# Patient Record
Sex: Female | Born: 1940 | Race: White | Hispanic: No | Marital: Married | Smoking: Never smoker
Health system: Southern US, Community
[De-identification: ages and names within clinical notes are randomized; demographics above are authoritative.]

## PROBLEM LIST (undated history)

## (undated) DIAGNOSIS — E781 Pure hyperglyceridemia: Secondary | ICD-10-CM

## (undated) DIAGNOSIS — T7840XA Allergy, unspecified, initial encounter: Secondary | ICD-10-CM

## (undated) DIAGNOSIS — C4490 Unspecified malignant neoplasm of skin, unspecified: Secondary | ICD-10-CM

## (undated) DIAGNOSIS — S83209A Unspecified tear of unspecified meniscus, current injury, unspecified knee, initial encounter: Secondary | ICD-10-CM

## (undated) DIAGNOSIS — R251 Tremor, unspecified: Secondary | ICD-10-CM

## (undated) DIAGNOSIS — I639 Cerebral infarction, unspecified: Secondary | ICD-10-CM

## (undated) DIAGNOSIS — E7800 Pure hypercholesterolemia, unspecified: Secondary | ICD-10-CM

## (undated) DIAGNOSIS — H269 Unspecified cataract: Secondary | ICD-10-CM

## (undated) DIAGNOSIS — C50919 Malignant neoplasm of unspecified site of unspecified female breast: Secondary | ICD-10-CM

## (undated) DIAGNOSIS — K5792 Diverticulitis of intestine, part unspecified, without perforation or abscess without bleeding: Secondary | ICD-10-CM

## (undated) DIAGNOSIS — E739 Lactose intolerance, unspecified: Secondary | ICD-10-CM

## (undated) DIAGNOSIS — M1990 Unspecified osteoarthritis, unspecified site: Secondary | ICD-10-CM

## (undated) HISTORY — PX: PAROTID GLAND TUMOR EXCISION: SHX5221

## (undated) HISTORY — DX: Unspecified malignant neoplasm of skin, unspecified: C44.90

## (undated) HISTORY — PX: OTHER SURGICAL HISTORY: SHX169

## (undated) HISTORY — PX: BRAIN SURGERY: SHX531

## (undated) HISTORY — DX: Malignant neoplasm of unspecified site of unspecified female breast: C50.919

## (undated) HISTORY — DX: Cerebral infarction, unspecified: I63.9

## (undated) HISTORY — PX: EYE SURGERY: SHX253

## (undated) HISTORY — DX: Allergy, unspecified, initial encounter: T78.40XA

## (undated) HISTORY — DX: Unspecified cataract: H26.9

## (undated) HISTORY — DX: Unspecified tear of unspecified meniscus, current injury, unspecified knee, initial encounter: S83.209A

## (undated) HISTORY — PX: TONSILLECTOMY: SHX5217

## (undated) HISTORY — DX: Lactose intolerance, unspecified: E73.9

## (undated) HISTORY — PX: WISDOM TOOTH EXTRACTION: SHX21

---

## 1988-05-25 HISTORY — PX: DILATION AND CURETTAGE OF UTERUS: SHX78

## 1988-05-25 HISTORY — PX: BREAST EXCISIONAL BIOPSY: SUR124

## 1998-05-07 ENCOUNTER — Other Ambulatory Visit: Admission: RE | Admit: 1998-05-07 | Discharge: 1998-05-07 | Payer: Self-pay | Admitting: *Deleted

## 1998-08-23 ENCOUNTER — Other Ambulatory Visit: Admission: RE | Admit: 1998-08-23 | Discharge: 1998-08-23 | Payer: Self-pay | Admitting: *Deleted

## 1999-03-21 ENCOUNTER — Encounter (INDEPENDENT_AMBULATORY_CARE_PROVIDER_SITE_OTHER): Payer: Self-pay | Admitting: Specialist

## 1999-03-21 ENCOUNTER — Other Ambulatory Visit: Admission: RE | Admit: 1999-03-21 | Discharge: 1999-03-21 | Payer: Self-pay | Admitting: *Deleted

## 1999-09-04 ENCOUNTER — Other Ambulatory Visit: Admission: RE | Admit: 1999-09-04 | Discharge: 1999-09-04 | Payer: Self-pay | Admitting: *Deleted

## 1999-09-15 ENCOUNTER — Encounter: Admission: RE | Admit: 1999-09-15 | Discharge: 1999-09-15 | Payer: Self-pay | Admitting: *Deleted

## 1999-09-15 ENCOUNTER — Encounter: Payer: Self-pay | Admitting: *Deleted

## 2000-09-15 ENCOUNTER — Encounter: Admission: RE | Admit: 2000-09-15 | Discharge: 2000-09-15 | Payer: Self-pay | Admitting: *Deleted

## 2000-09-15 ENCOUNTER — Encounter: Payer: Self-pay | Admitting: *Deleted

## 2000-09-20 ENCOUNTER — Other Ambulatory Visit: Admission: RE | Admit: 2000-09-20 | Discharge: 2000-09-20 | Payer: Self-pay | Admitting: *Deleted

## 2000-09-23 ENCOUNTER — Encounter: Payer: Self-pay | Admitting: *Deleted

## 2000-09-23 ENCOUNTER — Encounter: Admission: RE | Admit: 2000-09-23 | Discharge: 2000-09-23 | Payer: Self-pay | Admitting: *Deleted

## 2001-09-08 ENCOUNTER — Other Ambulatory Visit: Admission: RE | Admit: 2001-09-08 | Discharge: 2001-09-08 | Payer: Self-pay | Admitting: *Deleted

## 2001-10-13 ENCOUNTER — Encounter: Admission: RE | Admit: 2001-10-13 | Discharge: 2001-10-13 | Payer: Self-pay | Admitting: *Deleted

## 2001-10-13 ENCOUNTER — Encounter: Payer: Self-pay | Admitting: *Deleted

## 2002-08-30 ENCOUNTER — Other Ambulatory Visit: Admission: RE | Admit: 2002-08-30 | Discharge: 2002-08-30 | Payer: Self-pay | Admitting: *Deleted

## 2002-11-02 ENCOUNTER — Encounter: Admission: RE | Admit: 2002-11-02 | Discharge: 2002-11-02 | Payer: Self-pay | Admitting: *Deleted

## 2002-11-02 ENCOUNTER — Encounter: Payer: Self-pay | Admitting: *Deleted

## 2003-10-17 ENCOUNTER — Other Ambulatory Visit: Admission: RE | Admit: 2003-10-17 | Discharge: 2003-10-17 | Payer: Self-pay | Admitting: *Deleted

## 2003-11-05 ENCOUNTER — Encounter: Admission: RE | Admit: 2003-11-05 | Discharge: 2003-11-05 | Payer: Self-pay | Admitting: *Deleted

## 2004-10-28 ENCOUNTER — Other Ambulatory Visit: Admission: RE | Admit: 2004-10-28 | Discharge: 2004-10-28 | Payer: Self-pay | Admitting: *Deleted

## 2004-11-12 ENCOUNTER — Encounter: Admission: RE | Admit: 2004-11-12 | Discharge: 2004-11-12 | Payer: Self-pay | Admitting: *Deleted

## 2005-11-17 ENCOUNTER — Other Ambulatory Visit: Admission: RE | Admit: 2005-11-17 | Discharge: 2005-11-17 | Payer: Self-pay | Admitting: *Deleted

## 2005-11-17 ENCOUNTER — Encounter: Admission: RE | Admit: 2005-11-17 | Discharge: 2005-11-17 | Payer: Self-pay | Admitting: *Deleted

## 2006-11-22 ENCOUNTER — Encounter: Admission: RE | Admit: 2006-11-22 | Discharge: 2006-11-22 | Payer: Self-pay | Admitting: *Deleted

## 2006-12-21 ENCOUNTER — Other Ambulatory Visit: Admission: RE | Admit: 2006-12-21 | Discharge: 2006-12-21 | Payer: Self-pay | Admitting: *Deleted

## 2007-12-19 ENCOUNTER — Encounter: Admission: RE | Admit: 2007-12-19 | Discharge: 2007-12-19 | Payer: Self-pay | Admitting: Gynecology

## 2008-01-03 ENCOUNTER — Other Ambulatory Visit: Admission: RE | Admit: 2008-01-03 | Discharge: 2008-01-03 | Payer: Self-pay | Admitting: Gynecology

## 2009-01-08 ENCOUNTER — Encounter: Admission: RE | Admit: 2009-01-08 | Discharge: 2009-01-08 | Payer: Self-pay | Admitting: Internal Medicine

## 2010-06-16 ENCOUNTER — Encounter: Payer: Self-pay | Admitting: Gynecology

## 2014-02-28 ENCOUNTER — Other Ambulatory Visit: Payer: Self-pay

## 2014-02-28 DIAGNOSIS — Z1231 Encounter for screening mammogram for malignant neoplasm of breast: Secondary | ICD-10-CM

## 2014-03-14 ENCOUNTER — Ambulatory Visit: Payer: Self-pay

## 2014-03-23 ENCOUNTER — Ambulatory Visit
Admission: RE | Admit: 2014-03-23 | Discharge: 2014-03-23 | Disposition: A | Discharge status: Home / Self Care | Payer: Medicare PPO | Source: Ambulatory Visit

## 2014-03-23 DIAGNOSIS — Z1231 Encounter for screening mammogram for malignant neoplasm of breast: Secondary | ICD-10-CM

## 2014-07-31 DIAGNOSIS — E8881 Metabolic syndrome: Secondary | ICD-10-CM | POA: Diagnosis not present

## 2014-07-31 DIAGNOSIS — E663 Overweight: Secondary | ICD-10-CM | POA: Diagnosis not present

## 2014-07-31 DIAGNOSIS — E538 Deficiency of other specified B group vitamins: Secondary | ICD-10-CM | POA: Diagnosis not present

## 2014-10-23 DIAGNOSIS — E8881 Metabolic syndrome: Secondary | ICD-10-CM | POA: Diagnosis not present

## 2014-10-23 DIAGNOSIS — E538 Deficiency of other specified B group vitamins: Secondary | ICD-10-CM | POA: Diagnosis not present

## 2014-10-23 DIAGNOSIS — E663 Overweight: Secondary | ICD-10-CM | POA: Diagnosis not present

## 2014-10-23 DIAGNOSIS — N951 Menopausal and female climacteric states: Secondary | ICD-10-CM | POA: Diagnosis not present

## 2014-11-09 ENCOUNTER — Ambulatory Visit
Admission: RE | Admit: 2014-11-09 | Discharge: 2014-11-09 | Disposition: A | Discharge status: Home / Self Care | Payer: Medicare PPO | Source: Ambulatory Visit | Attending: Gastroenterology | Admitting: Gastroenterology

## 2014-11-09 ENCOUNTER — Encounter (HOSPITAL_BASED_OUTPATIENT_CLINIC_OR_DEPARTMENT_OTHER): Payer: Self-pay | Admitting: *Deleted

## 2014-11-09 ENCOUNTER — Inpatient Hospital Stay (HOSPITAL_BASED_OUTPATIENT_CLINIC_OR_DEPARTMENT_OTHER)
Admission: EM | Admit: 2014-11-09 | Discharge: 2014-11-11 | DRG: 392 | Disposition: A | Discharge status: Home / Self Care | Payer: Medicare PPO | Attending: Family Medicine | Admitting: Family Medicine

## 2014-11-09 ENCOUNTER — Other Ambulatory Visit: Payer: Self-pay | Admitting: Gastroenterology

## 2014-11-09 DIAGNOSIS — K5792 Diverticulitis of intestine, part unspecified, without perforation or abscess without bleeding: Secondary | ICD-10-CM

## 2014-11-09 DIAGNOSIS — E781 Pure hyperglyceridemia: Secondary | ICD-10-CM | POA: Diagnosis present

## 2014-11-09 DIAGNOSIS — Z8719 Personal history of other diseases of the digestive system: Secondary | ICD-10-CM | POA: Diagnosis present

## 2014-11-09 DIAGNOSIS — R112 Nausea with vomiting, unspecified: Secondary | ICD-10-CM | POA: Diagnosis present

## 2014-11-09 DIAGNOSIS — K828 Other specified diseases of gallbladder: Secondary | ICD-10-CM | POA: Diagnosis not present

## 2014-11-09 DIAGNOSIS — K5732 Diverticulitis of large intestine without perforation or abscess without bleeding: Secondary | ICD-10-CM

## 2014-11-09 DIAGNOSIS — A0472 Enterocolitis due to Clostridium difficile, not specified as recurrent: Secondary | ICD-10-CM

## 2014-11-09 DIAGNOSIS — K5730 Diverticulosis of large intestine without perforation or abscess without bleeding: Secondary | ICD-10-CM | POA: Diagnosis not present

## 2014-11-09 DIAGNOSIS — R109 Unspecified abdominal pain: Secondary | ICD-10-CM | POA: Diagnosis not present

## 2014-11-09 DIAGNOSIS — E780 Pure hypercholesterolemia: Secondary | ICD-10-CM | POA: Diagnosis present

## 2014-11-09 DIAGNOSIS — A047 Enterocolitis due to Clostridium difficile: Secondary | ICD-10-CM | POA: Diagnosis present

## 2014-11-09 HISTORY — DX: Tremor, unspecified: R25.1

## 2014-11-09 HISTORY — DX: Pure hypercholesterolemia, unspecified: E78.00

## 2014-11-09 HISTORY — DX: Pure hyperglyceridemia: E78.1

## 2014-11-09 HISTORY — DX: Diverticulitis of intestine, part unspecified, without perforation or abscess without bleeding: K57.92

## 2014-11-09 LAB — CBC WITH DIFFERENTIAL/PLATELET
BASOS ABS: 0 10*3/uL (ref 0.0–0.1)
Basophils Relative: 1 % (ref 0–1)
EOS PCT: 4 % (ref 0–5)
Eosinophils Absolute: 0.2 10*3/uL (ref 0.0–0.7)
HEMATOCRIT: 42.8 % (ref 36.0–46.0)
HEMOGLOBIN: 14.7 g/dL (ref 12.0–15.0)
LYMPHS ABS: 1.1 10*3/uL (ref 0.7–4.0)
Lymphocytes Relative: 23 % (ref 12–46)
MCH: 29.9 pg (ref 26.0–34.0)
MCHC: 34.3 g/dL (ref 30.0–36.0)
MCV: 87 fL (ref 78.0–100.0)
MONO ABS: 0.4 10*3/uL (ref 0.1–1.0)
MONOS PCT: 9 % (ref 3–12)
NEUTROS ABS: 3.1 10*3/uL (ref 1.7–7.7)
Neutrophils Relative %: 63 % (ref 43–77)
Platelets: 190 10*3/uL (ref 150–400)
RBC: 4.92 MIL/uL (ref 3.87–5.11)
RDW: 12.4 % (ref 11.5–15.5)
WBC: 4.9 10*3/uL (ref 4.0–10.5)

## 2014-11-09 LAB — COMPREHENSIVE METABOLIC PANEL
ALK PHOS: 50 U/L (ref 38–126)
ALT: 17 U/L (ref 14–54)
AST: 28 U/L (ref 15–41)
Albumin: 4.4 g/dL (ref 3.5–5.0)
Anion gap: 11 (ref 5–15)
BILIRUBIN TOTAL: 0.9 mg/dL (ref 0.3–1.2)
BUN: 6 mg/dL (ref 6–20)
CHLORIDE: 102 mmol/L (ref 101–111)
CO2: 25 mmol/L (ref 22–32)
Calcium: 9.8 mg/dL (ref 8.9–10.3)
Creatinine, Ser: 0.81 mg/dL (ref 0.44–1.00)
GLUCOSE: 91 mg/dL (ref 65–99)
Potassium: 4.1 mmol/L (ref 3.5–5.1)
SODIUM: 138 mmol/L (ref 135–145)
Total Protein: 7.2 g/dL (ref 6.5–8.1)

## 2014-11-09 MED ORDER — IOPAMIDOL (ISOVUE-300) INJECTION 61%
100.0000 mL | Freq: Once | INTRAVENOUS | Status: AC | PRN
  Administered 2014-11-09: 100 mL via INTRAVENOUS

## 2014-11-09 MED ORDER — SODIUM CHLORIDE 0.9 % IV SOLN
1000.0000 mL | INTRAVENOUS | Status: DC
  Administered 2014-11-10 – 2014-11-11 (×4): 1000 mL via INTRAVENOUS

## 2014-11-09 MED ORDER — SODIUM CHLORIDE 0.9 % IV SOLN
1000.0000 mL | Freq: Once | INTRAVENOUS | Status: AC
  Administered 2014-11-09: 1000 mL via INTRAVENOUS

## 2014-11-09 MED ORDER — ONDANSETRON HCL 4 MG/2ML IJ SOLN
4.0000 mg | Freq: Once | INTRAMUSCULAR | Status: AC
  Administered 2014-11-09: 4 mg via INTRAVENOUS
  Filled 2014-11-09: qty 2

## 2014-11-09 MED ORDER — METRONIDAZOLE 500 MG PO TABS
500.0000 mg | ORAL_TABLET | Freq: Four times a day (QID) | ORAL | Status: DC
  Administered 2014-11-09 – 2014-11-11 (×6): 500 mg via ORAL
  Filled 2014-11-09 (×10): qty 1

## 2014-11-09 MED ORDER — CIPROFLOXACIN IN D5W 400 MG/200ML IV SOLN
400.0000 mg | Freq: Two times a day (BID) | INTRAVENOUS | Status: DC
  Administered 2014-11-09 – 2014-11-11 (×4): 400 mg via INTRAVENOUS
  Filled 2014-11-09 (×5): qty 200

## 2014-11-09 NOTE — ED Notes (Signed)
Abdominal pain. Diarrhea 3 days. She had a CT scan that showed diverticulitis.

## 2014-11-09 NOTE — ED Provider Notes (Signed)
CSN: [id number]     Arrival date & time 11/09/14  1949 History  This chart was scribed for Dorie Rank, MD by Delphia Grates, ED Scribe. This patient was seen in room MH06/MH06 and the patient's care was started at 8:25 PM.   Chief Complaint  Patient presents with  . Abdominal Pain    The history is provided by the patient. No language interpreter was used.     HPI Comments: NAIDELYN PARRELLA is a 74 y.o. female who presents to the Emergency Department complaining of persistent diarrhea for the past 3 days that occurs after eating. Patient reports history of diverticulitis for the past several years, and reports an exacerbation approximately 5 days ago. She reports taking Augmentin and placing herself on a liquid diet. Patient then tried to transition to solid food and notes onset of diarrhea when doing so. Patient contacted the office of her gastroenterologist, Dr. Earlean Shawl, who informed her to a have a CT scan. She received a phone call later today from [place] her of positive findings for acute diverticulitis. She was then advised to come to the ED because the radiologist believes her current ABX treatment (Augmentin) may be ineffective. She reports taking this medication for the past 5 days. She denies any abdominal pain at this time.    Past Medical History  Diagnosis Date  . Diverticulitis   . High cholesterol   . High triglycerides   . Tremor    Past Surgical History  Procedure Laterality Date  . Wisdom tooth extraction     No family history on file. History  Substance Use Topics  . Smoking status: Never Smoker   . Smokeless tobacco: Not on file  . Alcohol Use: No   OB History    No data available     Review of Systems  Gastrointestinal: Positive for diarrhea. Negative for abdominal pain.  All other systems reviewed and are negative.     Allergies  Review of patient's allergies indicates no known allergies.  Home Medications   Prior to Admission  medications   Medication Sig Start Date End Date Taking? Authorizing Provider  Amoxicillin-Pot Clavulanate (AUGMENTIN PO) Take by mouth.   Yes Historical Provider, MD  atorvastatin (LIPITOR) 10 MG tablet Take 10 mg by mouth daily.   Yes Historical Provider, MD  Fenofibrate (TRICOR PO) Take by mouth.   Yes Historical Provider, MD  Ondansetron HCl (ZOFRAN PO) Take by mouth.   Yes Historical Provider, MD  propranolol (INDERAL) 10 MG tablet Take 10 mg by mouth 3 (three) times daily.   Yes Historical Provider, MD   Triage Vitals: BP 119/69 mmHg  Pulse 67  Temp(Src) 98 F (36.7 C) (Oral)  Resp 20  Ht 5\' 3"  (1.6 m)  Wt 138 lb (62.596 kg)  BMI 24.45 kg/m2  SpO2 99%  Physical Exam  Constitutional: She appears well-developed and well-nourished. No distress.  HENT:  Head: Normocephalic and atraumatic.  Right Ear: External ear normal.  Left Ear: External ear normal.  Eyes: Conjunctivae are normal. Right eye exhibits no discharge. Left eye exhibits no discharge. No scleral icterus.  Neck: Neck supple. No tracheal deviation present.  Cardiovascular: Normal rate, regular rhythm and intact distal pulses.   Pulmonary/Chest: Effort normal and breath sounds normal. No stridor. No respiratory distress. She has no wheezes. She has no rales.  Abdominal: Soft. Bowel sounds are normal. She exhibits no distension. There is tenderness in the suprapubic area. There is no rebound and no guarding.  Musculoskeletal: She exhibits no edema or tenderness.  Neurological: She is alert. She has normal strength. No cranial nerve deficit (no facial droop, extraocular movements intact, no slurred speech) or sensory deficit. She exhibits normal muscle tone. She displays no seizure activity. Coordination normal.  Skin: Skin is warm and dry. No rash noted.  Psychiatric: She has a normal mood and affect.  Nursing note and vitals reviewed.   ED Course  Procedures (including critical care time)  DIAGNOSTIC  STUDIES: Oxygen Saturation is 99% on room air, normal by my interpretation.    COORDINATION OF CARE: At 2035 Discussed treatment plan with patient. Patient agrees.   Labs Review Labs Reviewed  CBC WITH DIFFERENTIAL/PLATELET  COMPREHENSIVE METABOLIC PANEL    Imaging Review Ct Abdomen Pelvis W Contrast  11/09/2014   CLINICAL DATA:  74 year old female with clinical signs and symptoms suspicious for diverticulitis.  EXAM: CT ABDOMEN AND PELVIS WITH CONTRAST  TECHNIQUE: Multidetector CT imaging of the abdomen and pelvis was performed using the standard protocol following bolus administration of intravenous contrast.  CONTRAST:  161mL ISOVUE-300 IOPAMIDOL (ISOVUE-300) INJECTION 61%  COMPARISON:  CT of the abdomen and pelvis 11/11/2011.  FINDINGS: BUN and creatinine were obtained on site at [facility] at  [address]  Results:  BUN 5.0 mg/dL,  Creatinine 0.8 mg/dL.  Lower chest:  Unremarkable.  Hepatobiliary: No cystic or solid hepatic lesions. No intra or extrahepatic biliary ductal dilatation. Gallbladder is remarkable for some dependent amorphous intermediate to high attenuation material, most compatible with biliary sludge. Gallbladder is not distended, and there is no pericholecystic fluid or stranding to suggest an acute cholecystitis at this time.  Pancreas: No pancreatic mass. No pancreatic ductal dilatation. No pancreatic or peripancreatic fluid or inflammatory changes.  Spleen: Unremarkable.  Adrenals/Urinary Tract: Sub cm low-attenuation lesion in the posterior aspect of the lower pole of the right kidney is too small to definitively characterize, but is favored to represent tiny cysts. The appearance of the left kidney and bilateral adrenal glands is normal. No hydroureteronephrosis or perinephric stranding. Urinary bladder is normal in appearance.  Stomach/Bowel: The appearance of the stomach is normal. No pathologic dilatation of small bowel or colon. Numerous colonic  diverticulae are noted, particularly in the sigmoid colon. In the region of the proximal sigmoid colon there are some subtle adjacent inflammatory changes, concerning for acute diverticulitis. No discrete diverticular abscess or signs of frank perforation are noted at this time. Normal appendix.  Vascular/Lymphatic: Atherosclerosis throughout the abdominal and pelvic vasculature, without evidence of aneurysm or dissection. No lymphadenopathy noted in the abdomen or pelvis.  Reproductive: Retroverted uterus. Ovaries are unremarkable in appearance.  Other: No significant volume of ascites.  No pneumoperitoneum.  Musculoskeletal: There are no aggressive appearing lytic or blastic lesions noted in the visualized portions of the skeleton.  IMPRESSION: 1. Findings, as above, compatible with acute diverticulitis of the proximal sigmoid colon. No definite diverticular abscess or signs of frank perforation are noted at this time. 2. Biliary sludge in the gallbladder. No findings to suggest an acute cholecystitis at this time. 3. Additional incidental findings, as above. These results will be called to the ordering clinician or representative by the Radiologist Assistant, and communication documented in the PACS or zVision Dashboard.   Electronically Signed   By: Vinnie Langton M.D.   On: 11/09/2014 16:18    Medications  0.9 %  sodium chloride infusion (1,000 mLs Intravenous New Bag/Given 11/09/14 2048)    Followed by  0.9 %  sodium chloride infusion (not administered)  ciprofloxacin (CIPRO) IVPB 400 mg (400 mg Intravenous New Bag/Given 11/09/14 2053)  metroNIDAZOLE (FLAGYL) tablet 500 mg (500 mg Oral Given 11/09/14 2054)  ondansetron (ZOFRAN) injection 4 mg (4 mg Intravenous Given 11/09/14 2053)     MDM   Final diagnoses:  Diverticulitis of large intestine without perforation or abscess without bleeding   Pt has been on oral abx for 5 days.  Pain is improving but diarrhea persists.  CT scan shows persistent  diverticulitis.  Will consult with medical service for transfer and admission considering failure of oral abx regimen.    I personally performed the services described in this documentation, which was scribed in my presence.  The recorded information has been reviewed and is accurate.   Dorie Rank, MD 11/09/14 2126

## 2014-11-09 NOTE — ED Notes (Signed)
Patient states she noticed spotting blood on tissue paper. I notified nurse Collie Siad, and [place].

## 2014-11-09 NOTE — ED Notes (Signed)
Pt. Has been up to rest room several times walking with no difficulty with husband at her side.  Pt. Has had to urinate.

## 2014-11-10 DIAGNOSIS — K5732 Diverticulitis of large intestine without perforation or abscess without bleeding: Principal | ICD-10-CM

## 2014-11-10 DIAGNOSIS — R112 Nausea with vomiting, unspecified: Secondary | ICD-10-CM | POA: Diagnosis present

## 2014-11-10 LAB — CLOSTRIDIUM DIFFICILE BY PCR: CDIFFPCR: POSITIVE — AB

## 2014-11-10 MED ORDER — HEPARIN SODIUM (PORCINE) 5000 UNIT/ML IJ SOLN
5000.0000 [IU] | Freq: Three times a day (TID) | INTRAMUSCULAR | Status: DC
  Filled 2014-11-10 (×7): qty 1

## 2014-11-10 MED ORDER — ATORVASTATIN CALCIUM 10 MG PO TABS
10.0000 mg | ORAL_TABLET | Freq: Every day | ORAL | Status: DC
  Administered 2014-11-10 – 2014-11-11 (×2): 10 mg via ORAL
  Filled 2014-11-10 (×2): qty 1

## 2014-11-10 MED ORDER — ONDANSETRON HCL 4 MG/2ML IJ SOLN
INTRAMUSCULAR | Status: AC
  Filled 2014-11-10: qty 2

## 2014-11-10 MED ORDER — PROPRANOLOL HCL 10 MG PO TABS
10.0000 mg | ORAL_TABLET | Freq: Three times a day (TID) | ORAL | Status: DC
  Filled 2014-11-10 (×7): qty 1

## 2014-11-10 MED ORDER — ONDANSETRON HCL 4 MG/2ML IJ SOLN
4.0000 mg | Freq: Four times a day (QID) | INTRAMUSCULAR | Status: DC | PRN
  Administered 2014-11-10: 4 mg via INTRAVENOUS

## 2014-11-10 NOTE — Progress Notes (Signed)
Critical Lab Value received at 2110 - C Diff is positive.  Patient is on Flagyl PO.  [facility] made aware.  No new orders received.  Will continue to monitor patient.  [employer] RN-BC, WTA.

## 2014-11-10 NOTE — H&P (Signed)
[facility] History and Physical  Jodi Medina KDX:[id number] DOB: 12-Mar-1941 DOA: 11/09/2014  Referring physician: EDP PCP: No PCP Per Patient   Chief Complaint: Abdominal pain   HPI: Jodi Medina is a 74 y.o. female with h/o diverticulitis numerous times over the past couple of years.  Patient presents to the ED with c/o LLQ abdominal pain, N/V.  Similar to prior attacks.  She started taking amoxicilin 5 days ago due to this, but her symptoms havent gotten any better.  Pain is mild, but N/V is severe, and she is unable to keep much food down right now so she presents tot he ED.  Review of Systems: Systems reviewed.  As above, otherwise negative  Past Medical History  Diagnosis Date  . Diverticulitis   . High cholesterol   . High triglycerides   . Tremor    Past Surgical History  Procedure Laterality Date  . Wisdom tooth extraction     Social History:  reports that she has never smoked. She does not have any smokeless tobacco history on file. She reports that she does not drink alcohol or use illicit drugs.  No Known Allergies  No family history on file.   Prior to Admission medications   Medication Sig Start Date End Date Taking? Authorizing Provider  Amoxicillin-Pot Clavulanate (AUGMENTIN PO) Take by mouth.   Yes Historical Provider, MD  atorvastatin (LIPITOR) 10 MG tablet Take 10 mg by mouth daily.   Yes Historical Provider, MD  Fenofibrate (TRICOR PO) Take by mouth.   Yes Historical Provider, MD  Ondansetron HCl (ZOFRAN PO) Take by mouth.   Yes Historical Provider, MD  propranolol (INDERAL) 10 MG tablet Take 10 mg by mouth 3 (three) times daily.   Yes Historical Provider, MD   Physical Exam: Filed Vitals:   11/10/14 0120  BP: 113/62  Pulse: 63  Temp: 98.2 F (36.8 C)  Resp: 18    BP 113/62 mmHg  Pulse 63  Temp(Src) 98.2 F (36.8 C) (Oral)  Resp 18  Ht 5\' 3"  (1.6 m)  Wt 62.007 kg (136 lb 11.2 oz)  BMI 24.22 kg/m2  SpO2 97%  General Appearance:     Alert, oriented, no distress, appears stated age  Head:    Normocephalic, atraumatic  Eyes:    PERRL, EOMI, sclera non-icteric        Nose:   Nares without drainage or epistaxis. Mucosa, turbinates normal  Throat:   Moist mucous membranes. Oropharynx without erythema or exudate.  Neck:   Supple. No carotid bruits.  No thyromegaly.  No lymphadenopathy.   Back:     No CVA tenderness, no spinal tenderness  Lungs:     Clear to auscultation bilaterally, without wheezes, rhonchi or rales  Chest wall:    No tenderness to palpitation  Heart:    Regular rate and rhythm without murmurs, gallops, rubs  Abdomen:     Soft, non-tender, nondistended, normal bowel sounds, no organomegaly  Genitalia:    deferred  Rectal:    deferred  Extremities:   No clubbing, cyanosis or edema.  Pulses:   2+ and symmetric all extremities  Skin:   Skin color, texture, turgor normal, no rashes or lesions  Lymph nodes:   Cervical, supraclavicular, and axillary nodes normal  Neurologic:   CNII-XII intact. Normal strength, sensation and reflexes      throughout    Labs on Admission:  Basic Metabolic Panel:  Recent Labs Lab 11/09/14 2049  NA 138  K 4.1  CL 102  CO2 25  GLUCOSE 91  BUN 6  CREATININE 0.81  CALCIUM 9.8   Liver Function Tests:  Recent Labs Lab 11/09/14 2049  AST 28  ALT 17  ALKPHOS 50  BILITOT 0.9  PROT 7.2  ALBUMIN 4.4   No results for input(s): LIPASE, AMYLASE in the last 168 hours. No results for input(s): AMMONIA in the last 168 hours. CBC:  Recent Labs Lab 11/09/14 2049  WBC 4.9  NEUTROABS 3.1  HGB 14.7  HCT 42.8  MCV 87.0  PLT 190   Cardiac Enzymes: No results for input(s): CKTOTAL, CKMB, CKMBINDEX, TROPONINI in the last 168 hours.  BNP (last 3 results) No results for input(s): PROBNP in the last 8760 hours. CBG: No results for input(s): GLUCAP in the last 168 hours.  Radiological Exams on Admission: Ct Abdomen Pelvis W Contrast  11/09/2014   CLINICAL DATA:   74 year old female with clinical signs and symptoms suspicious for diverticulitis.  EXAM: CT ABDOMEN AND PELVIS WITH CONTRAST  TECHNIQUE: Multidetector CT imaging of the abdomen and pelvis was performed using the standard protocol following bolus administration of intravenous contrast.  CONTRAST:  150mL ISOVUE-300 IOPAMIDOL (ISOVUE-300) INJECTION 61%  COMPARISON:  CT of the abdomen and pelvis 11/11/2011.  FINDINGS: BUN and creatinine were obtained on site at [facility] at  [address]  Results:  BUN 5.0 mg/dL,  Creatinine 0.8 mg/dL.  Lower chest:  Unremarkable.  Hepatobiliary: No cystic or solid hepatic lesions. No intra or extrahepatic biliary ductal dilatation. Gallbladder is remarkable for some dependent amorphous intermediate to high attenuation material, most compatible with biliary sludge. Gallbladder is not distended, and there is no pericholecystic fluid or stranding to suggest an acute cholecystitis at this time.  Pancreas: No pancreatic mass. No pancreatic ductal dilatation. No pancreatic or peripancreatic fluid or inflammatory changes.  Spleen: Unremarkable.  Adrenals/Urinary Tract: Sub cm low-attenuation lesion in the posterior aspect of the lower pole of the right kidney is too small to definitively characterize, but is favored to represent tiny cysts. The appearance of the left kidney and bilateral adrenal glands is normal. No hydroureteronephrosis or perinephric stranding. Urinary bladder is normal in appearance.  Stomach/Bowel: The appearance of the stomach is normal. No pathologic dilatation of small bowel or colon. Numerous colonic diverticulae are noted, particularly in the sigmoid colon. In the region of the proximal sigmoid colon there are some subtle adjacent inflammatory changes, concerning for acute diverticulitis. No discrete diverticular abscess or signs of frank perforation are noted at this time. Normal appendix.  Vascular/Lymphatic: Atherosclerosis throughout the  abdominal and pelvic vasculature, without evidence of aneurysm or dissection. No lymphadenopathy noted in the abdomen or pelvis.  Reproductive: Retroverted uterus. Ovaries are unremarkable in appearance.  Other: No significant volume of ascites.  No pneumoperitoneum.  Musculoskeletal: There are no aggressive appearing lytic or blastic lesions noted in the visualized portions of the skeleton.  IMPRESSION: 1. Findings, as above, compatible with acute diverticulitis of the proximal sigmoid colon. No definite diverticular abscess or signs of frank perforation are noted at this time. 2. Biliary sludge in the gallbladder. No findings to suggest an acute cholecystitis at this time. 3. Additional incidental findings, as above. These results will be called to the ordering clinician or representative by the Radiologist Assistant, and communication documented in the PACS or zVision Dashboard.   Electronically Signed   By: Vinnie Langton M.D.   On: 11/09/2014 16:18    EKG: Independently reviewed.  Assessment/Plan Principal Problem:  Diverticulitis   1. Diverticulitis - 1. cipro / flagyl 2. Admitting due to fact that patient failed outpatient ABX 3. Zofran PRN nausea 4. Follow up with her GI doc after this hospital stay    Code Status: Full Code  Family Communication: No family in room Disposition Plan: Admit to inpatient   Time spent: 50 min  Tyasia Packard M. [facility] Pager [phone]  If 7AM-7PM, please contact the day team taking care of the patient [email or web address] Password [email or web address] 11/10/2014, 2:17 AM

## 2014-11-10 NOTE — Progress Notes (Signed)
Subjective: Patient admitted this morning, see detailed H&P by Dr. Alcario Drought. Patient recently diagnosed with diverticulitis and treated as outpatient with Augmentin for 5 days came with diarrhea. Augmentin discontinued and patient started on Cipro and Flagyl. Diarrhea has improved. Patient is still on the antibiotic precautions for possible C. difficile. C. difficile PCR is pending.  Filed Vitals:   11/10/14 0842  BP: 106/59  Pulse: 57  Temp: 97.9 F (36.6 C)  Resp: 18    Chest: Clear Bilaterally Heart : S1S2 RRR Abdomen: Soft, nontender Ext : No edema Neuro: Alert, oriented x 3  A/P Acute diverticulitis Patient currently on Cipro and Flagyl.  CT abdomen pelvis only shows mild diverticulitis Once C. difficile PCR is negative will discharge the patient home on Cipro and Flagyl.      [place] [facility] [phone]

## 2014-11-11 DIAGNOSIS — A0472 Enterocolitis due to Clostridium difficile, not specified as recurrent: Secondary | ICD-10-CM

## 2014-11-11 DIAGNOSIS — R112 Nausea with vomiting, unspecified: Secondary | ICD-10-CM

## 2014-11-11 DIAGNOSIS — A047 Enterocolitis due to Clostridium difficile: Secondary | ICD-10-CM

## 2014-11-11 MED ORDER — METRONIDAZOLE 500 MG PO TABS: 500.0000 mg | ORAL_TABLET | Freq: Three times a day (TID) | ORAL | Status: DC

## 2014-11-11 MED ORDER — METRONIDAZOLE 500 MG PO TABS
500.0000 mg | ORAL_TABLET | Freq: Three times a day (TID) | ORAL | Status: DC
  Filled 2014-11-11 (×3): qty 1

## 2014-11-11 NOTE — Discharge Summary (Signed)
Physician Discharge Summary  Jodi Medina ALP:[id number] DOB: 1941/05/15 DOA: 11/09/2014  PCP: No PCP Per Patient  Admit date: 11/09/2014 Discharge date: 11/11/2014  Time spent: *25 minutes  Recommendations for Outpatient Follow-up:  1. Follow up PCP in 2 weeks  Discharge Diagnoses:  Principal Problem:   Diverticulitis Active Problems:   Nausea and vomiting   Discharge Condition: Stable  Diet recommendation: Low salt diet  Filed Weights   11/09/14 2002 11/10/14 0120 11/10/14 2039  Weight: 62.596 kg (138 lb) 62.007 kg (136 lb 11.2 oz) 64.093 kg (141 lb 4.8 oz)    History of present illness:  74 y.o. female with h/o diverticulitis numerous times over the past couple of years. Patient presents to the ED with c/o LLQ abdominal pain, N/V. Similar to prior attacks. She started taking amoxicilin 5 days ago due to this, but her symptoms havent gotten any better. Pain is mild, but N/V is severe, and she is unable to keep much food down right now so she presents tot he ED.  Hospital Course:   Acute diverticulitis Patient was started on cipro and  flagyl CT abdomen pelvis only shows mild diverticulitis C diff came back positive, called and discussed with infectious disease, patient already has received 2 days of Cipro and 5 days of Augmentin as outpatient. Patient will be discharged on Flagyl 500 mg by mouth 3 times a day for total 14 days.  C. Difficile As above patient has positive C. difficile will discharge the patient on Flagyl 500 3 times a day for 14 days.    Procedures: None Consultations:  None  Discharge Exam: Filed Vitals:   11/11/14 0539  BP: 95/53  Pulse: 53  Temp: 98.7 F (37.1 C)  Resp: 18    General: Appears in no acute distress Cardiovascular: S1-S2 regular Respiratory: Clear to auscultation bilaterally  Discharge Instructions   Discharge Instructions    Diet - low sodium heart healthy    Complete by:  As directed      Increase activity  slowly    Complete by:  As directed           Current Discharge Medication List    START taking these medications   Details  metroNIDAZOLE (FLAGYL) 500 MG tablet Take 1 tablet (500 mg total) by mouth 3 (three) times daily. Qty: 28 tablet, Refills: 0      CONTINUE these medications which have NOT CHANGED   Details  aspirin EC 81 MG tablet Take 81 mg by mouth at bedtime.    atorvastatin (LIPITOR) 10 MG tablet Take 5 mg by mouth daily.     B Complex-Folic Acid (B COMPLEX-VITAMIN B12 PO) Take 1 tablet by mouth daily.    CALCIUM CITRATE PO Take 1 tablet by mouth daily.    cholecalciferol (VITAMIN D) 1000 UNITS tablet Take 1,000 Units by mouth daily.    fenofibrate (TRICOR) 145 MG tablet Take 145 mg by mouth daily. Refills: 4    Multiple Vitamin (MULTIVITAMIN) tablet Take 1 tablet by mouth daily.    Ondansetron HCl (ZOFRAN PO) Take 1 tablet by mouth every 8 (eight) hours as needed (nausea and vomiting).     propranolol (INDERAL) 10 MG tablet Take 5 mg by mouth daily as needed (for tremor).       STOP taking these medications     Amoxicillin-Pot Clavulanate (AUGMENTIN PO)        No Known Allergies    The results of significant diagnostics from this hospitalization (including imaging,  microbiology, ancillary and laboratory) are listed below for reference.    Significant Diagnostic Studies: Ct Abdomen Pelvis W Contrast  11/09/2014   CLINICAL DATA:  74 year old female with clinical signs and symptoms suspicious for diverticulitis.  EXAM: CT ABDOMEN AND PELVIS WITH CONTRAST  TECHNIQUE: Multidetector CT imaging of the abdomen and pelvis was performed using the standard protocol following bolus administration of intravenous contrast.  CONTRAST:  142mL ISOVUE-300 IOPAMIDOL (ISOVUE-300) INJECTION 61%  COMPARISON:  CT of the abdomen and pelvis 11/11/2011.  FINDINGS: BUN and creatinine were obtained on site at [facility] at  [address]  Results:  BUN 5.0 mg/dL,   Creatinine 0.8 mg/dL.  Lower chest:  Unremarkable.  Hepatobiliary: No cystic or solid hepatic lesions. No intra or extrahepatic biliary ductal dilatation. Gallbladder is remarkable for some dependent amorphous intermediate to high attenuation material, most compatible with biliary sludge. Gallbladder is not distended, and there is no pericholecystic fluid or stranding to suggest an acute cholecystitis at this time.  Pancreas: No pancreatic mass. No pancreatic ductal dilatation. No pancreatic or peripancreatic fluid or inflammatory changes.  Spleen: Unremarkable.  Adrenals/Urinary Tract: Sub cm low-attenuation lesion in the posterior aspect of the lower pole of the right kidney is too small to definitively characterize, but is favored to represent tiny cysts. The appearance of the left kidney and bilateral adrenal glands is normal. No hydroureteronephrosis or perinephric stranding. Urinary bladder is normal in appearance.  Stomach/Bowel: The appearance of the stomach is normal. No pathologic dilatation of small bowel or colon. Numerous colonic diverticulae are noted, particularly in the sigmoid colon. In the region of the proximal sigmoid colon there are some subtle adjacent inflammatory changes, concerning for acute diverticulitis. No discrete diverticular abscess or signs of frank perforation are noted at this time. Normal appendix.  Vascular/Lymphatic: Atherosclerosis throughout the abdominal and pelvic vasculature, without evidence of aneurysm or dissection. No lymphadenopathy noted in the abdomen or pelvis.  Reproductive: Retroverted uterus. Ovaries are unremarkable in appearance.  Other: No significant volume of ascites.  No pneumoperitoneum.  Musculoskeletal: There are no aggressive appearing lytic or blastic lesions noted in the visualized portions of the skeleton.  IMPRESSION: 1. Findings, as above, compatible with acute diverticulitis of the proximal sigmoid colon. No definite diverticular abscess or signs  of frank perforation are noted at this time. 2. Biliary sludge in the gallbladder. No findings to suggest an acute cholecystitis at this time. 3. Additional incidental findings, as above. These results will be called to the ordering clinician or representative by the Radiologist Assistant, and communication documented in the PACS or zVision Dashboard.   Electronically Signed   By: Vinnie Langton M.D.   On: 11/09/2014 16:18    Microbiology: Recent Results (from the past 240 hour(s))  Clostridium Difficile by PCR (not at [facility])     Status: Abnormal   Collection Time: 11/10/14  2:37 PM  Result Value Ref Range Status   C difficile by pcr POSITIVE (A) NEGATIVE Final    Comment: CRITICAL RESULT CALLED TO, READ BACK BY AND VERIFIED WITH: K.GENNELAR,RN 11/10/14 @2100  BY V.WILKINS      Labs: Basic Metabolic Panel:  Recent Labs Lab 11/09/14 2049  NA 138  K 4.1  CL 102  CO2 25  GLUCOSE 91  BUN 6  CREATININE 0.81  CALCIUM 9.8   Liver Function Tests:  Recent Labs Lab 11/09/14 2049  AST 28  ALT 17  ALKPHOS 50  BILITOT 0.9  PROT 7.2  ALBUMIN 4.4  No results for input(s): LIPASE, AMYLASE in the last 168 hours. No results for input(s): AMMONIA in the last 168 hours. CBC:  Recent Labs Lab 11/09/14 2049  WBC 4.9  NEUTROABS 3.1  HGB 14.7  HCT 42.8  MCV 87.0  PLT 190       Signed:  Delainee Tramel S  [facility] 11/11/2014, 10:25 AM

## 2014-11-11 NOTE — Progress Notes (Signed)
Pt provided with discharge instructions including information on follow up appointments and prescriptions. Pt with remaining questions so MD notified. Dr. Darrick Meigs was able to answer pt and husband's questions. IV Dc'd with no complication.VSS. Pt refused wheelchair and escorted out by husband.   Tyna Jaksch, RN

## 2014-11-12 ENCOUNTER — Encounter: Admission: RE | Payer: Self-pay | Source: Ambulatory Visit

## 2014-11-12 ENCOUNTER — Ambulatory Visit
Admission: RE | Admit: 2014-11-12 | Payer: Medicare PPO | Source: Ambulatory Visit | Admitting: Unknown Physician Specialty

## 2014-11-12 SURGERY — EGD (ESOPHAGOGASTRODUODENOSCOPY)
Anesthesia: General

## 2014-12-14 DIAGNOSIS — K5730 Diverticulosis of large intestine without perforation or abscess without bleeding: Secondary | ICD-10-CM | POA: Diagnosis not present

## 2014-12-17 DIAGNOSIS — E538 Deficiency of other specified B group vitamins: Secondary | ICD-10-CM | POA: Diagnosis not present

## 2014-12-17 DIAGNOSIS — E8881 Metabolic syndrome: Secondary | ICD-10-CM | POA: Diagnosis not present

## 2014-12-17 DIAGNOSIS — E663 Overweight: Secondary | ICD-10-CM | POA: Diagnosis not present

## 2014-12-17 DIAGNOSIS — N951 Menopausal and female climacteric states: Secondary | ICD-10-CM | POA: Diagnosis not present

## 2014-12-31 DIAGNOSIS — E8881 Metabolic syndrome: Secondary | ICD-10-CM | POA: Diagnosis not present

## 2014-12-31 DIAGNOSIS — E663 Overweight: Secondary | ICD-10-CM | POA: Diagnosis not present

## 2014-12-31 DIAGNOSIS — E538 Deficiency of other specified B group vitamins: Secondary | ICD-10-CM | POA: Diagnosis not present

## 2015-05-26 HISTORY — PX: DENTAL SURGERY: SHX609

## 2015-07-10 ENCOUNTER — Ambulatory Visit (INDEPENDENT_AMBULATORY_CARE_PROVIDER_SITE_OTHER): Payer: Medicare Other | Admitting: Family

## 2015-07-10 ENCOUNTER — Encounter: Payer: Self-pay | Admitting: Family

## 2015-07-10 VITALS — BP 134/82 | HR 60 | Temp 97.8°F | Resp 16 | Ht 63.0 in | Wt 158.4 lb

## 2015-07-10 DIAGNOSIS — E785 Hyperlipidemia, unspecified: Secondary | ICD-10-CM | POA: Diagnosis not present

## 2015-07-10 DIAGNOSIS — G250 Essential tremor: Secondary | ICD-10-CM | POA: Diagnosis not present

## 2015-07-10 DIAGNOSIS — Z8719 Personal history of other diseases of the digestive system: Secondary | ICD-10-CM | POA: Diagnosis not present

## 2015-07-10 DIAGNOSIS — M549 Dorsalgia, unspecified: Secondary | ICD-10-CM | POA: Insufficient documentation

## 2015-07-10 MED ORDER — VALACYCLOVIR HCL 1 G PO TABS: 1000.0000 mg | ORAL_TABLET | Freq: Three times a day (TID) | ORAL | Status: DC

## 2015-07-10 MED ORDER — MELOXICAM 7.5 MG PO TABS: 7.5000 mg | ORAL_TABLET | Freq: Every day | ORAL | Status: DC

## 2015-07-10 NOTE — Progress Notes (Signed)
Pre visit review using our clinic review tool, if applicable. No additional management support is needed unless otherwise documented below in the visit note. 

## 2015-07-10 NOTE — Progress Notes (Signed)
Subjective:    Patient ID: Jodi Medina, female    DOB: [birth year], 75 y.o.   MRN: [id number]  HPI  [name] is a 75 yr old female who presents today to establish care.  Pmhx is significant for the following:  1) Diverticulitis- Patient reports that she has about 9 episodes in the last 4 years. She also reports + hx of c diff.  She was admitted to [facility] in June with diverticulitis and C. Diff. Patient has a GI in [place] (Dr. Earlean Shawl).  2) Hyperlipidemia-She is maintained on fenofibrate and lipitor.  Pt reports that her cholesterol control is OK on this regimen.  Had it checked on Friday.   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 She has had since her 51's. Reports propranolol helps some for this.  She only uses PRN.  4) Reports soreness/swelling between the spine and shoulder blade.  Reports that she is taking advil with some improvement. She is also using arnica gel.  Also using a heating pad.  Pain began Friday evening.  Denies previous symptoms like this.  Pain is noted to be to the left of the spine and sometimes radiates outward on the left.   Review of Systems  Constitutional: Negative for fever and unexpected weight change.  HENT: Positive for rhinorrhea. Negative for hearing loss.   Eyes: Negative for visual disturbance.  Respiratory: Negative for cough and shortness of breath.   Cardiovascular: Negative for leg swelling.  Gastrointestinal: Negative for blood in stool.  Genitourinary: Negative for dysuria and frequency.  Musculoskeletal: Positive for back pain. Negative for arthralgias.  Skin: Negative for rash.  Neurological: Positive for tremors.       Reports mild HA today, generally does not have HA's  Hematological: Negative for adenopathy.  Psychiatric/Behavioral:       Denies depression/anxiety   Past Medical History  Diagnosis Date  . Diverticulitis   . High cholesterol   . High triglycerides   . Tremor     Social  History   Social History  . Marital Status: Married    Spouse Name: N/A  . Number of Children: N/A  . Years of Education: N/A   Occupational History  . Not on file.   Social History Main Topics  . Smoking status: Never Smoker   . Smokeless tobacco: Not on file  . Alcohol Use: 1.2 oz/week    2 Standard drinks or equivalent per week  . Drug Use: No  . Sexual Activity: Not on file   Other Topics Concern  . Not on file   Social History Narrative   Married   Masters degree   Retired Sports coach.    Past Surgical History  Procedure Laterality Date  . Wisdom tooth extraction    . Breast surgery Left 1990    biopsy, normal per pt  . Tonsillectomy      as a child    Family History  Problem Relation Age of Onset  . Hyperlipidemia Mother   . Tremor Mother   . Osteoporosis Mother   . Alzheimer's disease Father   . Cancer Maternal Aunt     breast  . Stroke Maternal Grandmother     No Known Allergies  Current Outpatient Prescriptions on File Prior to Visit  Medication Sig Dispense Refill  . aspirin EC 81 MG tablet Take 81 mg by mouth at bedtime.    Marland Kitchen atorvastatin (LIPITOR) 10 MG tablet Take 5  mg by mouth daily.     . B Complex-Folic Acid (B COMPLEX-VITAMIN B12 PO) Take 1 tablet by mouth daily.    Marland Kitchen CALCIUM CITRATE PO Take 1 tablet by mouth daily.    . cholecalciferol (VITAMIN D) 1000 UNITS tablet Take 1,000 Units by mouth daily.    . fenofibrate (TRICOR) 145 MG tablet Take 145 mg by mouth daily.  4  . propranolol (INDERAL) 10 MG tablet Take 5 mg by mouth daily as needed (for tremor).      No current facility-administered medications on file prior to visit.    BP 134/82 mmHg  Pulse 60  Temp(Src) 97.8 F (36.6 C) (Oral)  Resp 16  Ht 5\' 3"  (1.6 m)  Wt 158 lb 6.4 oz (71.85 kg)  BMI 28.07 kg/m2  SpO2 98%        Objective:   Physical Exam  Constitutional: She is oriented to person, place, and time. She appears well-developed and well-nourished.    HENT:  Head: Normocephalic and atraumatic.  Eyes: No scleral icterus.  Neck: Neck supple.  Cardiovascular: Normal rate, regular rhythm and normal heart sounds.   No murmur heard. Pulmonary/Chest: Effort normal and breath sounds normal. No respiratory distress. She has no wheezes.  Musculoskeletal: She exhibits no edema.  Neurological: She is alert and oriented to person, place, and time.  Skin: Skin is warm and dry. No rash noted.  No obvious rash or swelling on back, skin is tender to the touch left of midline on back  Psychiatric: She has a normal mood and affect. Her behavior is normal. Judgment and thought content normal.  Neuro:  Mild tremor is noted.         Assessment & Plan:  Back pain- either unilateral nerve pain or early shingles without obvious rash- favor the latter. Will give empiric valtrex as well as short course of meloxicam. If symptoms worsen or do not improve without

## 2015-07-10 NOTE — Patient Instructions (Signed)
Start valtrex for your back. Let me know if you are still having pain on Friday and if you develop a rash.

## 2015-07-11 DIAGNOSIS — E785 Hyperlipidemia, unspecified: Secondary | ICD-10-CM | POA: Insufficient documentation

## 2015-07-11 DIAGNOSIS — G250 Essential tremor: Secondary | ICD-10-CM | POA: Insufficient documentation

## 2015-07-11 NOTE — Assessment & Plan Note (Signed)
Pt has GI, Dr. Earlean Shawl,  Currently asymptomatic.

## 2015-07-11 NOTE — Assessment & Plan Note (Signed)
Pt manages with prn propranolol.

## 2015-07-11 NOTE — Assessment & Plan Note (Signed)
Pt reports fair control, had checked last week at her former PCP's office.

## 2015-07-12 ENCOUNTER — Telehealth: Payer: Self-pay | Admitting: Family

## 2015-07-12 DIAGNOSIS — M546 Pain in thoracic spine: Secondary | ICD-10-CM

## 2015-07-12 NOTE — Telephone Encounter (Signed)
Pt returned my call. States she has taken meloxicam and valacyclovir. No rash has developed yet, still having intermittent pain around shoulder blade. Wants to know if she should proceed with a spinal xray?

## 2015-07-12 NOTE — Telephone Encounter (Signed)
Yes, I will place order

## 2015-07-12 NOTE — Telephone Encounter (Signed)
Left message for pt to return my call and provide additional info.  What symptoms is pt having? What type of xray was she expecting?

## 2015-07-12 NOTE — Addendum Note (Signed)
Addended by: Debbrah Alar on: 07/12/2015 02:40 PM   Modules accepted: Orders

## 2015-07-12 NOTE — Telephone Encounter (Signed)
Caller name: Self  Can be reached: [phone]   Reason for call: Patient called to follow up from last appointment. States that Lenna Sciara was going to make a decision on getting an x-ray.

## 2015-07-12 NOTE — Telephone Encounter (Signed)
Left detailed message on cell# and to call if any questions. Advised her that she may walk in for xray on first floor any day next week and hours of operation left on voicemail.

## 2015-07-13 ENCOUNTER — Ambulatory Visit (HOSPITAL_BASED_OUTPATIENT_CLINIC_OR_DEPARTMENT_OTHER)
Admission: RE | Admit: 2015-07-13 | Discharge: 2015-07-13 | Disposition: A | Discharge status: Home / Self Care | Payer: Medicare Other | Source: Ambulatory Visit | Attending: Family | Admitting: Family

## 2015-07-13 DIAGNOSIS — M47814 Spondylosis without myelopathy or radiculopathy, thoracic region: Secondary | ICD-10-CM | POA: Insufficient documentation

## 2015-07-13 DIAGNOSIS — M546 Pain in thoracic spine: Secondary | ICD-10-CM | POA: Diagnosis present

## 2015-07-15 ENCOUNTER — Telehealth: Payer: Self-pay | Admitting: *Deleted

## 2015-07-15 NOTE — Telephone Encounter (Signed)
If still no rash, ok to d/c valcyclovir.  Continue meloxicam.  Likely related to musculoskeletal spinal issue.  I would recommend that she give it another week or so.  If still not improved let me know.

## 2015-07-15 NOTE — Telephone Encounter (Signed)
Notified pt. She states pain is mostly now across shoulder blade and worse If she tries to hold objects. Wants to know if you think pain is from the degenerative changes or shingles?  Pt wants to know if she should continue the meloxicam, should she stop valacyclovir? Still has not developed rash. Please advise.

## 2015-07-15 NOTE — Telephone Encounter (Signed)
-----   Message from Debbrah Alar, NP sent at 07/14/2015  9:23 AM EST ----- Please let pt know x ray negative for fracture.  Does note some mild degenerative changes.

## 2015-07-16 NOTE — Telephone Encounter (Signed)
Patient called back and wanted to know does the medication prescribe take the place of receiving shingle injection and in regards to pain should she take tylenol. Please advise

## 2015-07-16 NOTE — Telephone Encounter (Signed)
Melissa-- please advise? 

## 2015-07-16 NOTE — Telephone Encounter (Signed)
Valtrex does not replace shingles vaccine. She should check with her insurance re: vaccine coverage and then schedule nurse visit.  OK to take tylenol as needed for pain.

## 2015-07-16 NOTE — Telephone Encounter (Signed)
Left detailed message on cell# re: below instructions and to call if any questions. 

## 2015-07-16 NOTE — Telephone Encounter (Signed)
Left detailed message on cell # and to call if further questions. 

## 2016-01-29 ENCOUNTER — Other Ambulatory Visit: Payer: Self-pay | Admitting: Internal Medicine

## 2016-01-29 DIAGNOSIS — Z1231 Encounter for screening mammogram for malignant neoplasm of breast: Secondary | ICD-10-CM

## 2016-02-10 ENCOUNTER — Ambulatory Visit
Admission: RE | Admit: 2016-02-10 | Discharge: 2016-02-10 | Disposition: A | Discharge status: Home / Self Care | Payer: Medicare Other | Source: Ambulatory Visit | Attending: Internal Medicine | Admitting: Internal Medicine

## 2016-02-10 DIAGNOSIS — Z1231 Encounter for screening mammogram for malignant neoplasm of breast: Secondary | ICD-10-CM

## 2016-05-14 ENCOUNTER — Other Ambulatory Visit: Payer: Self-pay | Admitting: Internal Medicine

## 2016-05-14 ENCOUNTER — Other Ambulatory Visit (HOSPITAL_COMMUNITY)
Admission: RE | Admit: 2016-05-14 | Discharge: 2016-05-14 | Disposition: A | Discharge status: Home / Self Care | Payer: Medicare Other | Source: Ambulatory Visit | Attending: Internal Medicine | Admitting: Internal Medicine

## 2016-05-14 DIAGNOSIS — Z1151 Encounter for screening for human papillomavirus (HPV): Secondary | ICD-10-CM | POA: Diagnosis present

## 2016-05-14 DIAGNOSIS — Z01419 Encounter for gynecological examination (general) (routine) without abnormal findings: Secondary | ICD-10-CM | POA: Diagnosis present

## 2016-05-17 IMAGING — CT CT ABD-PELV W/ CM
2 of 5 series · 15 of 46 positions shown, 17 images · IV contrast (APPLIED)
Comparison: CT of the abdomen and pelvis 11/11/2011.

CLINICAL DATA: 73-year-old female with clinical signs and symptoms
suspicious for diverticulitis.

EXAM:
CT ABDOMEN AND PELVIS WITH CONTRAST
TECHNIQUE: Multidetector CT imaging of the abdomen and pelvis was performed
using the standard protocol following bolus administration of
intravenous contrast.
CONTRAST:  100mL WPWFFT-GYY IOPAMIDOL (WPWFFT-GYY) INJECTION 61%

[Series 2: abd pelvis 5.0 i41s 1 · axial · 0.75mm/px · z∈[-448,-38]mm · 12 of 92 slices shown, 14 images]
[im 5/92  soft-tissue]
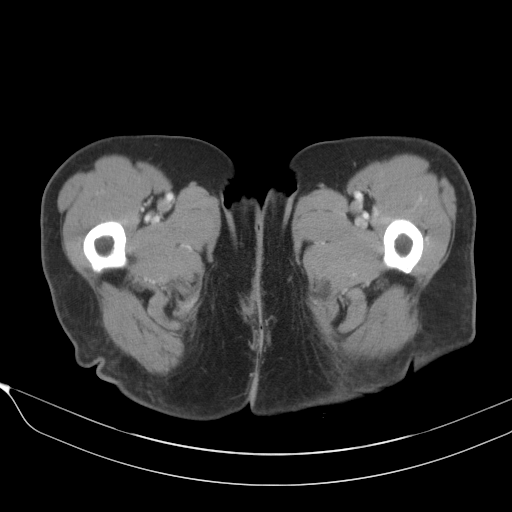
[im 5/92  bone]
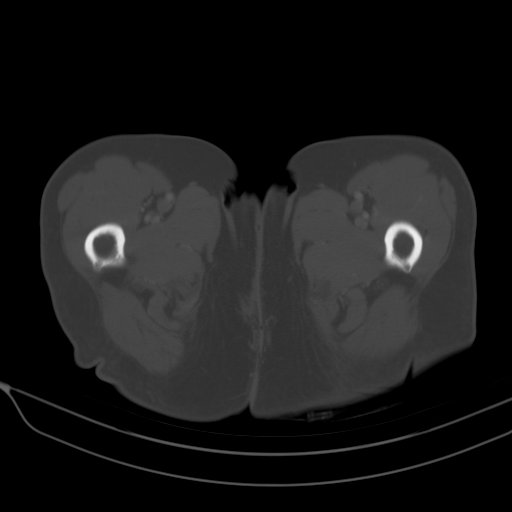
[im 15/92  soft-tissue]
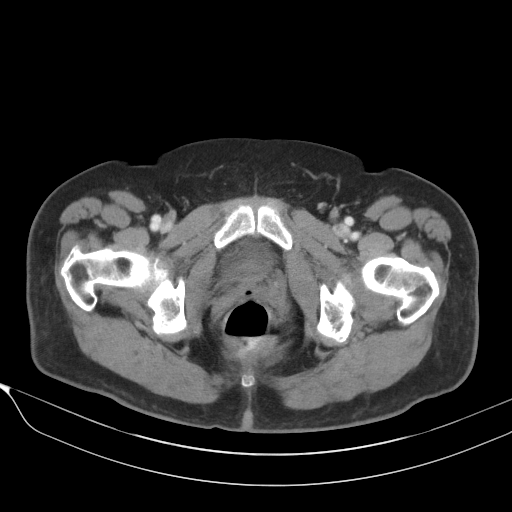
[im 20/92  soft-tissue]
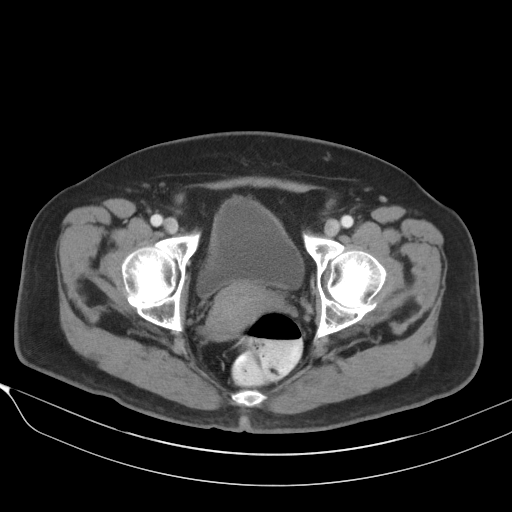
[im 29/92  soft-tissue]
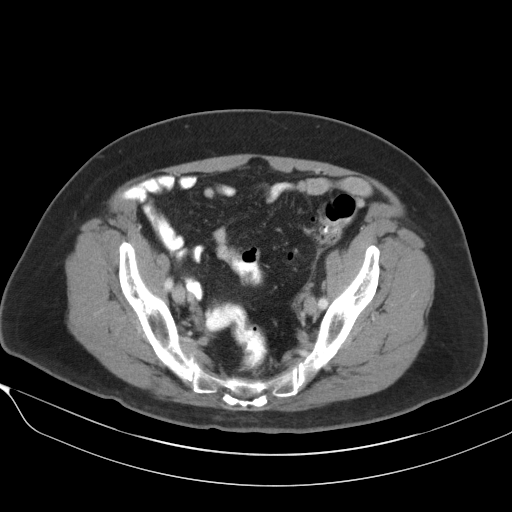
[im 34/92  soft-tissue]
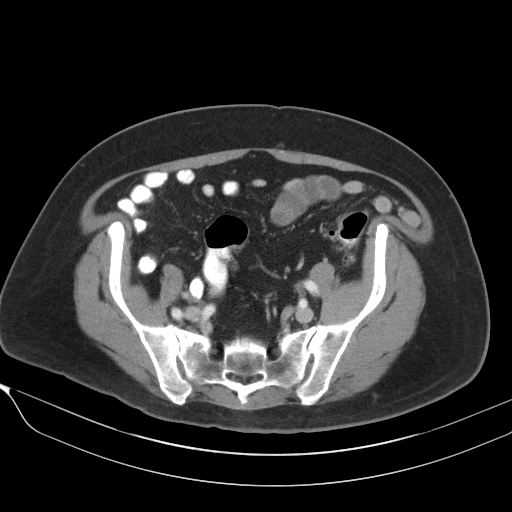
[im 44/92  soft-tissue]
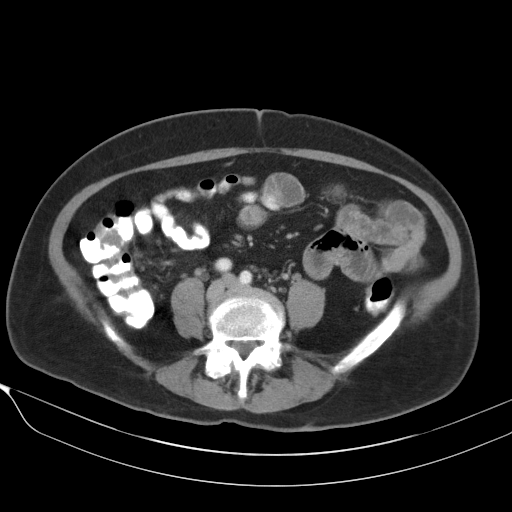
[im 48/92  soft-tissue]
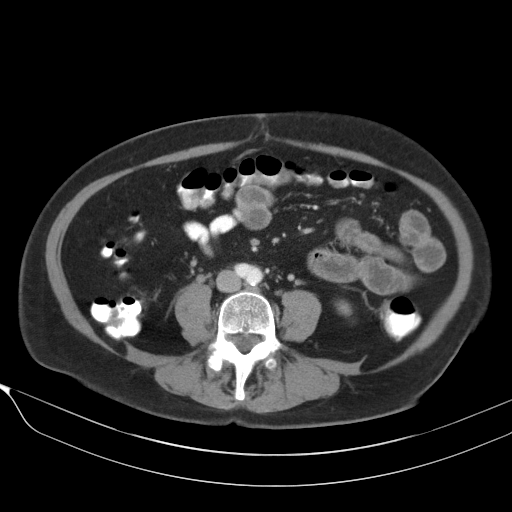
[im 58/92  soft-tissue]
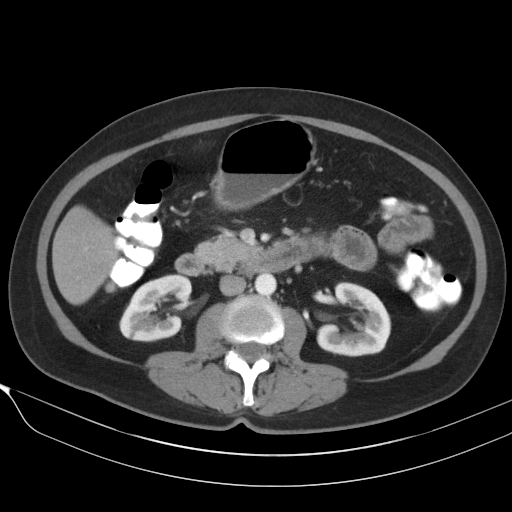
[im 63/92  soft-tissue]
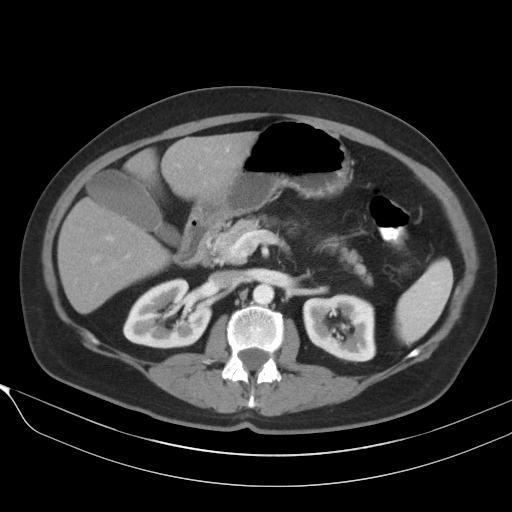
[im 63/92  bone]
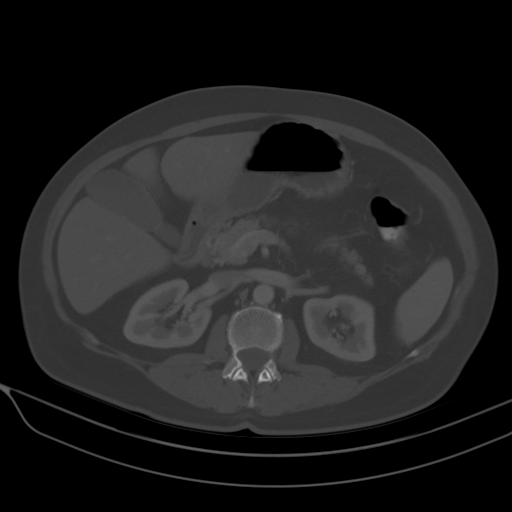
[im 72/92  soft-tissue]
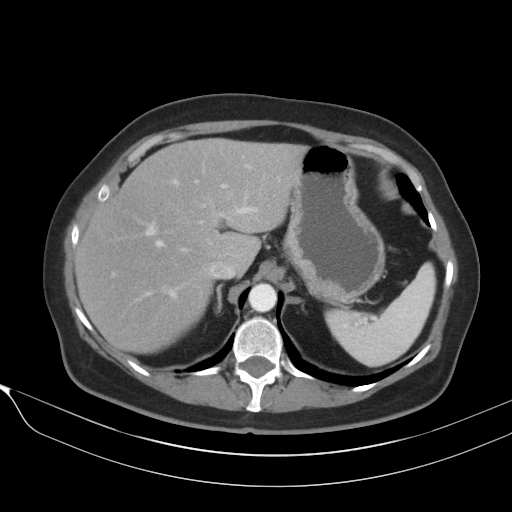
[im 77/92  soft-tissue]
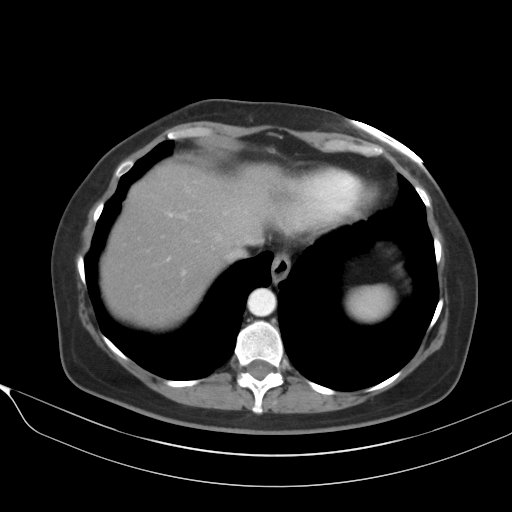
[im 87/92  soft-tissue]
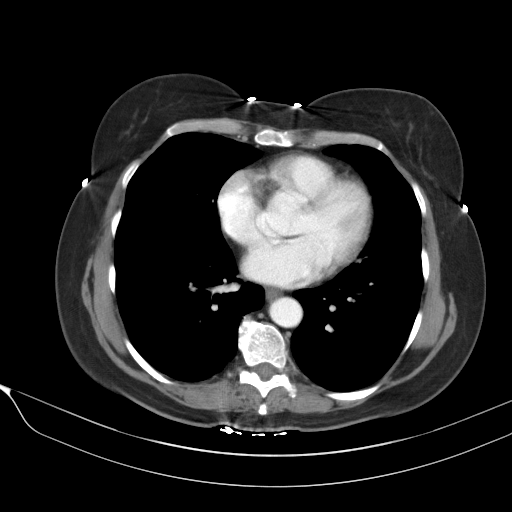

[Series 3: abd pelvis 3.0 spo cor · coronal · 0.71mm/px · 3 of 73 slices shown]
[im 25/73  soft-tissue]
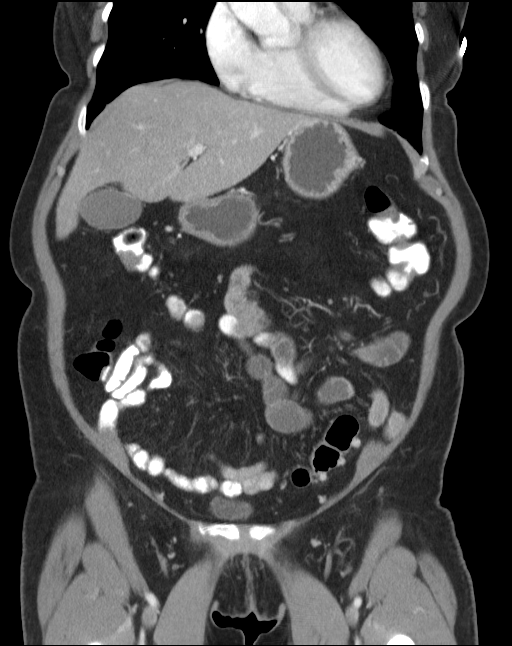
[im 33/73  soft-tissue]
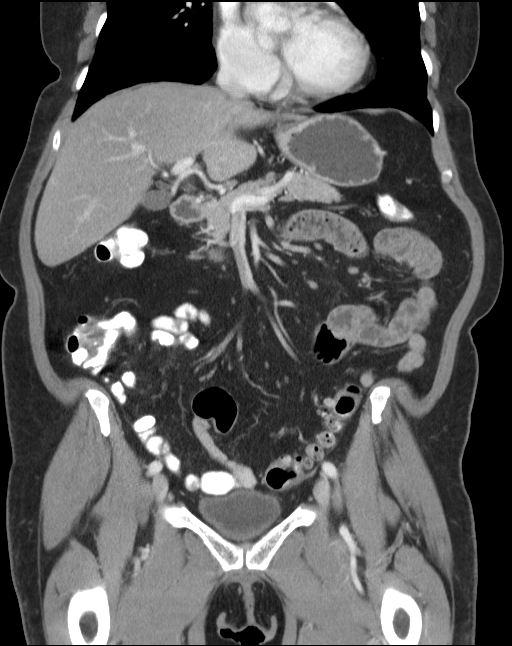
[im 41/73  soft-tissue]
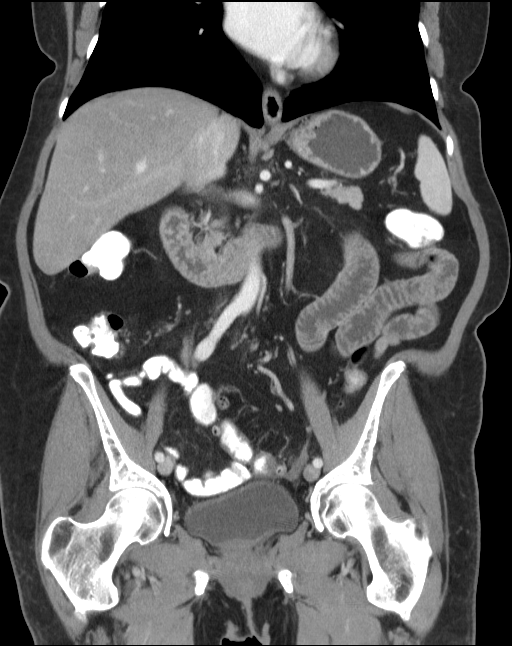

[15 of 46 positions shown; findings below may reference images not displayed]

FINDINGS: BUN and creatinine were obtained on site at [HOSPITAL] at

[HOSPITAL].

Results:  BUN 5.0 mg/dL,  Creatinine 0.8 mg/dL.

Lower chest:  Unremarkable.

Hepatobiliary: No cystic or solid hepatic lesions. No intra or
extrahepatic biliary ductal dilatation. Gallbladder is remarkable
for some dependent amorphous intermediate to high attenuation
material, most compatible with biliary sludge. Gallbladder is not
distended, and there is no pericholecystic fluid or stranding to
suggest an acute cholecystitis at this time.

Pancreas: No pancreatic mass. No pancreatic ductal dilatation. No
pancreatic or peripancreatic fluid or inflammatory changes.

Spleen: Unremarkable.

Adrenals/Urinary Tract: Sub cm low-attenuation lesion in the
posterior aspect of the lower pole of the right kidney is too small
to definitively characterize, but is favored to represent tiny
cysts. The appearance of the left kidney and bilateral adrenal
glands is normal. No hydroureteronephrosis or perinephric stranding.
Urinary bladder is normal in appearance.

Stomach/Bowel: The appearance of the stomach is normal. No
pathologic dilatation of small bowel or colon. Numerous colonic
diverticulae are noted, particularly in the sigmoid colon. In the
region of the proximal sigmoid colon there are some subtle adjacent
inflammatory changes, concerning for acute diverticulitis. No
discrete diverticular abscess or signs of frank perforation are
noted at this time. Normal appendix.

Vascular/Lymphatic: Atherosclerosis throughout the abdominal and
pelvic vasculature, without evidence of aneurysm or dissection. No
lymphadenopathy noted in the abdomen or pelvis.

Reproductive: Retroverted uterus. Ovaries are unremarkable in
appearance.

Other: No significant volume of ascites.  No pneumoperitoneum.

Musculoskeletal: There are no aggressive appearing lytic or blastic
lesions noted in the visualized portions of the skeleton.
IMPRESSION: 1. Findings, as above, compatible with acute diverticulitis of the
proximal sigmoid colon. No definite diverticular abscess or signs of
frank perforation are noted at this time.
2. Biliary sludge in the gallbladder. No findings to suggest an
acute cholecystitis at this time.
3. Additional incidental findings, as above.
These results will be called to the ordering clinician or
representative by the Radiologist Assistant, and communication
documented in the PACS or zVision Dashboard.

## 2016-05-22 LAB — CYTOLOGY - PAP
DIAGNOSIS: NEGATIVE
HPV (WINDOPATH): NOT DETECTED

## 2016-10-23 DIAGNOSIS — D126 Benign neoplasm of colon, unspecified: Secondary | ICD-10-CM | POA: Insufficient documentation

## 2017-02-19 ENCOUNTER — Other Ambulatory Visit: Payer: Self-pay | Admitting: Internal Medicine

## 2017-02-19 DIAGNOSIS — Z1231 Encounter for screening mammogram for malignant neoplasm of breast: Secondary | ICD-10-CM

## 2017-03-02 ENCOUNTER — Ambulatory Visit
Admission: RE | Admit: 2017-03-02 | Discharge: 2017-03-02 | Disposition: A | Discharge status: Home / Self Care | Payer: Medicare Other | Source: Ambulatory Visit | Attending: Internal Medicine | Admitting: Internal Medicine

## 2017-03-02 DIAGNOSIS — Z1231 Encounter for screening mammogram for malignant neoplasm of breast: Secondary | ICD-10-CM

## 2017-04-27 ENCOUNTER — Ambulatory Visit: Payer: Medicare Other | Admitting: Neurology

## 2017-05-25 HISTORY — PX: COLONOSCOPY: SHX174

## 2017-06-17 ENCOUNTER — Ambulatory Visit: Payer: Self-pay | Admitting: Neurology

## 2017-07-28 ENCOUNTER — Ambulatory Visit: Payer: Medicare Other | Admitting: Neurology

## 2017-07-28 ENCOUNTER — Encounter: Payer: Self-pay | Admitting: Neurology

## 2017-07-28 VITALS — BP 119/73 | HR 62

## 2017-07-28 DIAGNOSIS — G250 Essential tremor: Secondary | ICD-10-CM

## 2017-07-28 DIAGNOSIS — R413 Other amnesia: Secondary | ICD-10-CM

## 2017-07-28 DIAGNOSIS — Z818 Family history of other mental and behavioral disorders: Secondary | ICD-10-CM | POA: Diagnosis not present

## 2017-07-28 MED ORDER — PROPRANOLOL HCL 10 MG PO TABS: 10.0000 mg | ORAL_TABLET | Freq: Three times a day (TID) | ORAL | 11 refills | Status: DC | PRN

## 2017-07-28 NOTE — Progress Notes (Signed)
[facility]    Provider:  Dr Jaynee Eagles Referring Provider: Debbrah Alar, NP Primary Care Physician:  Lanice Shirts, MD  CC:  Benign essential tremor.   HPI:  Jodi Medina is a 77 y.o. female here as a referral from Dr. Inda Castle for benign essential tremor. PMHx HLD, benign essential tremor, osteopenia. She had a mechanical fall recently and hit her head. It was a hard hit, no headaches or symptoms of concussion. She has had tremors since mid 21s, fhx in mother and her side of the family. Her brother has parkinson's Disease. She was put on propranolol, she takes it daily and it is less effective. She is only on 5mg . Tremor interferes with eating and drinking, trouble writing and eating soup, she braces her arms when she pus on her eye makeup. Writing is terrible. Slowly progressive. Started with the right hand and then progressed to the left hand. Mother had dementia in her 69s. No new symptoms, no difficulty walking, no other falls, no imbalance, dysphagia. Father had Alzheimer's in his 12s. Sheis worried about Alzheimer's but denies any memory loss at this time, discussed dementia with patient. No other focal neurologic deficits, associated symptoms, inciting events or modifiable factors.  Reviewed notes, labs and imaging from outside physicians, which showed:  Review of Systems: Patient complains of symptoms per HPI as well as the following symptoms: tremor. Pertinent negatives and positives per HPI. All others negative.   Social History   Socioeconomic History  . Marital status: Married    Spouse name: Not on file  . Number of children: 3  . Years of education: Not on file  . Highest education level: Master's degree (e.g., MA, MS, MEng, MEd, MSW, MBA)  Social Needs  . Financial resource strain: Not on file  . Food insecurity - worry: Not on file  . Food insecurity - inability: Not on file  . Transportation needs - medical: Not on file  .  Transportation needs - non-medical: Not on file  Occupational History  . Not on file  Tobacco Use  . Smoking status: Never Smoker  . Smokeless tobacco: Never Used  Substance and Sexual Activity  . Alcohol use: Yes    Alcohol/week: 1.2 oz    Types: 2 Standard drinks or equivalent per week    Comment: social occasions   . Drug use: No  . Sexual activity: Not on file  Other Topics Concern  . Not on file  Social History Narrative   Married, lives at home with husband   Daughter in [place]- 1 grandson   Daughter in [place]- no children   Son- IT works on Soil scientist, travel in an [place], lives in [place]   Retired school Associate Professor.    Family History  Problem Relation Age of Onset  . Hyperlipidemia Mother   . Tremor Mother   . Osteoporosis Mother   . Dementia Mother   . Heart failure Mother   . Alzheimer's disease Father   . Cancer Maternal Aunt        breast  . Stroke Maternal Grandmother   . Heart disease Maternal Uncle   . Heart disease Paternal Uncle     Past Medical History:  Diagnosis Date  . Diverticulitis   . High cholesterol   . High triglycerides   . Lactose intolerance   . Skin cancer   . Tremor     Past Surgical History:  Procedure Laterality Date  . BREAST EXCISIONAL  BIOPSY Left 1990  . DENTAL SURGERY  2017  . 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 skin cancer removal  1990s  . TONSILLECTOMY     as a child  . WISDOM TOOTH EXTRACTION      Current Outpatient Medications  Medication Sig Dispense Refill  . aspirin EC 81 MG tablet Take 81 mg by mouth at bedtime.    Marland Kitchen atorvastatin (LIPITOR) 10 MG tablet Take 5 mg by mouth daily.     . B Complex-Folic Acid (B COMPLEX-VITAMIN B12 PO) Take 1 tablet by mouth daily.    Marland Kitchen CALCIUM CITRATE PO Take 1 tablet by mouth daily. Has Vitamin D 250 units  Calcium 315 mg    . Cholecalciferol (VITAMIN D3 PO) Take 2,000 Int'l Units by mouth.    . fenofibrate (TRICOR) 145 MG tablet Take  145 mg by mouth daily.  4  . Probiotic Product (PROBIOTIC PO) Take by mouth. Align    . propranolol (INDERAL) 10 MG tablet Take 5 mg by mouth daily as needed (for tremor).     . Wheat Dextrin (BENEFIBER) POWD Take by mouth daily.     No current facility-administered medications for this visit.     Allergies as of 07/28/2017  . (No Known Allergies)    Vitals: BP 119/73 (BP Location: Right Arm, Patient Position: Sitting)   Pulse 62  Last Weight:  Wt Readings from Last 1 Encounters:  07/10/15 158 lb 6.4 oz (71.8 kg)   Last Height:   Ht Readings from Last 1 Encounters:  07/10/15 5\' 3"  (1.6 m)    Physical exam: Exam: Gen: NAD, conversant, well nourised, obese, well groomed                     CV: RRR, no MRG. No Carotid Bruits. No peripheral edema, warm, nontender Eyes: Conjunctivae clear without exudates or hemorrhage  Neuro: Detailed Neurologic Exam  Speech:    Speech is normal; fluent and spontaneous with normal comprehension.  Cognition:    The patient is oriented to person, place, and time;     recent and remote memory intact;     language fluent;     normal attention, concentration,     fund of knowledge Cranial Nerves:    The pupils are equal, round, and reactive to light. Attempted fundoscopic exam could not visualize. Visual fields are full to finger confrontation. Extraocular movements are intact. Trigeminal sensation is intact and the muscles of mastication are normal. The face is symmetric. The palate elevates in the midline. Hearing intact. Voice is normal. Shoulder shrug is normal. The tongue has normal motion without fasciculations.   Coordination:    Normal finger to nose and heel to shin. Normal rapid alternating movements.   Gait:    normal  Motor Observation:    No asymmetry, no atrophy, mild postural and action tremor Tone:    Normal muscle tone.    Posture:    Posture is normal. normal erect    Strength:    Strength is V/V in the upper and  lower limbs.      Sensation: intact to LT     Reflex Exam:  DTR's:    Deep tendon reflexes in the upper and lower extremities are symmetrical bilaterally.   Toes:    The toes are equivocal bilaterally.   Clonus:    Clonus is absent.      Assessment/Plan:  77 year old with essential tremor  -  Essential Tremor:  Increase propranolol, discussed side effects stop for anything concerning. Check tsh. - Fhx of dementia, check B12, discussed if she feels she has memory loss to RTC  Orders Placed This Encounter  Procedures  . TSH  . B12 and Folate Panel  . Methylmalonic acid, serum   A total of 45 minutes was spent in with this patient face-to-face. Over half this time was spent on counseling patient on the essential tremor, FHx dementia diagnosis and different therapeutic options available.    Sarina Ill, MD  [facility] [address] [place], [place] [address]  Phone [phone] Fax [phone]

## 2017-07-28 NOTE — Patient Instructions (Signed)
Essential Tremor A tremor is trembling or shaking that you cannot control. Most tremors affect the hands or arms. Tremors can also affect the head, vocal cords, face, and other parts of the body. Essential tremor is a tremor without a known cause. What are the causes? Essential tremor has no known cause. What increases the risk? You may be at greater risk of essential tremor if:  You have a family member with essential tremor.  You are age 75 or older.  You take certain medicines.  What are the signs or symptoms? The main sign of a tremor is uncontrolled and unintentional rhythmic shaking of a body part.  You may have difficulty eating with a spoon or fork.  You may have difficulty writing.  You may nod your head up and down or side to side.  You may have a quivering voice.  Your tremors:  May get worse over time.  May come and go.  May be more noticeable on one side of your body.  May get worse due to stress, fatigue, caffeine, and extreme heat or cold.  How is this diagnosed? Your health care provider can diagnose essential tremor based on your symptoms, medical history, and a physical examination. There is no single test to diagnose an essential tremor. However, your health care provider may perform a variety of tests to rule out other conditions. Tests may include:  Blood and urine tests.  Imaging studies of your brain, such as: ? CT scan. ? MRI.  A test that measures involuntary muscle movement (electromyogram).  How is this treated? Your tremors may go away without treatment. Mild tremors may not need treatment if they do not affect your day-to-day life. Severe tremors may need to be treated using one or a combination of the following options:  Medicines. This may include medicine that is injected.  Lifestyle changes.  Physical therapy.  Follow these instructions at home:  Take medicines only as directed by your health care provider.  Limit alcohol  intake to no more than 1 drink per day for nonpregnant women and 2 drinks per day for men. One drink equals 12 oz of beer, 5 oz of wine, or 1 oz of hard liquor.  Do not use any tobacco products, including cigarettes, chewing tobacco, or electronic cigarettes. If you need help quitting, ask your health care provider.  Take medicines only as directed by your health care provider.  Avoid extreme heat or cold.  Limit the amount of caffeine you consumeas directed by your health care provider.  Try to get eight hours of sleep each night.  Find ways to manage your stress, such as meditation or yoga.  Keep all follow-up visits as directed by your health care provider. This is important. This includes any physical therapy visits. Contact a health care provider if:  You experience any changes in the location or intensity of your tremors.  You start having a tremor after starting a new medicine.  You have tremor with other symptoms such as: ? Numbness. ? Tingling. ? Pain. ? Weakness.  Your tremor gets worse.  Your tremor interferes with your daily life. This information is not intended to replace advice given to you by your health care provider. Make sure you discuss any questions you have with your health care provider. Document Released: 06/01/2014 Document Revised: 10/17/2015 Document Reviewed: 11/06/2013 Elsevier Interactive Patient Education  2018 Elsevier Inc.  

## 2017-07-29 ENCOUNTER — Telehealth: Payer: Self-pay | Admitting: *Deleted

## 2017-07-29 NOTE — Telephone Encounter (Signed)
Spoke with patient. She is aware her labs are normal. She had no questions.

## 2017-07-29 NOTE — Telephone Encounter (Signed)
-----   Message from Melvenia Beam, MD sent at 07/29/2017 10:15 AM EST ----- Labs normal thanks

## 2017-07-30 LAB — METHYLMALONIC ACID, SERUM: METHYLMALONIC ACID: 126 nmol/L (ref 0–378)

## 2017-07-30 LAB — TSH: TSH: 3.07 u[IU]/mL (ref 0.450–4.500)

## 2017-07-30 LAB — B12 AND FOLATE PANEL
FOLATE: 19.5 ng/mL (ref >3.0)
VITAMIN B 12: 526 pg/mL (ref 232–1245)

## 2017-12-23 HISTORY — PX: OTHER SURGICAL HISTORY: SHX169

## 2018-02-01 ENCOUNTER — Ambulatory Visit: Payer: Medicare Other | Attending: Orthopedic Surgery | Admitting: Physical Therapy

## 2018-02-01 ENCOUNTER — Other Ambulatory Visit: Payer: Self-pay

## 2018-02-01 DIAGNOSIS — R262 Difficulty in walking, not elsewhere classified: Secondary | ICD-10-CM | POA: Diagnosis present

## 2018-02-01 DIAGNOSIS — R600 Localized edema: Secondary | ICD-10-CM

## 2018-02-01 DIAGNOSIS — M25662 Stiffness of left knee, not elsewhere classified: Secondary | ICD-10-CM | POA: Insufficient documentation

## 2018-02-01 DIAGNOSIS — M25562 Pain in left knee: Secondary | ICD-10-CM | POA: Diagnosis not present

## 2018-02-01 NOTE — Therapy (Signed)
[facility] [place], [place], [address] Phone: [phone]   Fax:  [phone]  Physical Therapy Evaluation  Patient Details  Name: Jodi Medina MRN: [id number] Date of Birth: [birth year] Referring Provider: Lorenz Coaster   Encounter Date: 02/01/2018  PT End of Session - 02/01/18 1010    Visit Number  1    Date for PT Re-Evaluation  04/03/18    PT Start Time  0930    PT Stop Time  1024    PT Time Calculation (min)  54 min    Activity Tolerance  Patient tolerated treatment well    Behavior During Therapy  [facility] for tasks assessed/performed       Past Medical History:  Diagnosis Date  . Diverticulitis   . High cholesterol   . High triglycerides   . Lactose intolerance   . Skin cancer   . Tremor     Past Surgical History:  Procedure Laterality Date  . BREAST EXCISIONAL BIOPSY Left 1990  . DENTAL SURGERY  2017  . 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 skin cancer removal  1990s  . TONSILLECTOMY     as a child  . WISDOM TOOTH EXTRACTION      There were no vitals filed for this visit.   Subjective Assessment - 02/01/18 0935    Subjective  Patient underwent left knee scope for meniscal tears on 01/12/18.  She started PT at teh orthopedic office for 3 visits but it is far from her home and she lives very close to here so she got his permission to come here.  She reports that July 25th she was going up stairs onto a plane started having pain, she reports that she has right knee issues as well.    Limitations  Standing;Walking;House hold activities    Patient Stated Goals  have better motion, less pain, walk better    Currently in Pain?  Yes    Pain Score  2     Pain Location  Knee    Pain Orientation  Left;Medial    Pain Descriptors / Indicators  Aching    Pain Type  Acute pain;Surgical pain    Pain Onset  More than a month ago    Pain Frequency  Constant    Aggravating Factors    walking, bending, stairs pain up to pain up to 8/10    Pain Relieving Factors  ice and rest    Effect of Pain on Daily Activities  slow walking, difficulty with stairs and ADL's         [facility] PT Assessment - 02/01/18 0001      Assessment   Medical Diagnosis  s/p left knee scope    Referring Provider  Caffery    Onset Date/Surgical Date  01/12/18    Prior Therapy  3 visits at orthopods office      Precautions   Precautions  None      Balance Screen   Has the patient fallen in the past 6 months  No    Has the patient had a decrease in activity level because of a fear of falling?   No    Is the patient reluctant to leave their home because of a fear of falling?   No      Home Environment   Additional Comments  2 steps into the home, does some housework, and yardwork      Prior  Function   Level of Independence  Independent    Vocation  Retired    Leisure  walked the dog      Observation/Other Assessments-Edema    Edema  Circumferential      Circumferential Edema   Circumferential - Right  mid patella 40.5 cm    Circumferential - Left   41.5 cm      ROM / Strength   AROM / PROM / Strength  AROM;PROM;Strength      AROM   AROM Assessment Site  Knee    Right/Left Knee  Left    Left Knee Extension  12    Left Knee Flexion  95      PROM   Overall PROM Comments  pain with flexion    PROM Assessment Site  Knee    Right/Left Knee  Left    Left Knee Extension  0    Left Knee Flexion  106      Strength   Overall Strength Comments  4/5 with mild pain      Palpation   Palpation comment  tender and warm to touch in the medial knee      Ambulation/Gait   Gait Comments  uses a SPC, slow, antalgic on the left, seems to have some fear, tends to carry the cane                Objective measurements completed on examination: See above findings.      [facility] PT Treatment/Exercise - 02/01/18 0001      Exercises   Exercises  Knee/Hip      Knee/Hip Exercises:  Aerobic   Recumbent Bike  x 5 minutes partial revolutions    Nustep  level 3 x 5 minutes      Modalities   Modalities  Vasopneumatic      Vasopneumatic   Number Minutes Vasopneumatic   15 minutes    Vasopnuematic Location   Knee    Vasopneumatic Pressure  Medium    Vasopneumatic Temperature   35             PT Education - 02/01/18 1009    Education Details  reviewd her HEP from the orthopod, made one change for LLLD stretch into flexion with her sitting and then scoot bottom forward    Person(s) Educated  Patient    Methods  Explanation;Demonstration;Handout    Comprehension  Verbalized understanding;Returned demonstration       PT Short Term Goals - 02/01/18 1014      PT SHORT TERM GOAL #1   Title  independent with intitial HEP    Time  2    Period  Weeks    Status  Partially Met        PT Long Term Goals - 02/01/18 1014      PT LONG TERM GOAL #1   Title  understand RICE for home pain and edema relief    Time  8    Period  Weeks    Status  New      PT LONG TERM GOAL #2   Title  increase AROM to 0-120 degree flexion    Time  8    Period  Weeks    Status  New      PT LONG TERM GOAL #3   Title  decrease pain 50%    Time  8    Period  Weeks    Status  New      PT LONG  TERM GOAL #4   Title  walk all distances without device    Time  8    Period  Weeks    Status  New             Plan - 02/01/18 1010    Clinical Impression Statement  PAtient reports pain going up stairs to a plane in July, MRI showed meniscal tears in the left knee, she underwent left knee scope 01/12/18.  She had 3 PT visits at the orthopod's office but lives much closer to here so she is transferring to our office.  Her AROM was 12-95 degrees iwth pain into flexion.  Walks slow, antalgic on the left, she does also report right knee pain .    Clinical Presentation  Evolving    Clinical Decision Making  Low    PT Frequency  2x / week    PT Duration  8 weeks    PT  Treatment/Interventions  ADLs/Self Care Home Management;Cryotherapy;Electrical Stimulation;Gait training;Stair training;Functional mobility training;Therapeutic activities;Therapeutic exercise;Balance training;Neuromuscular re-education;Manual techniques;Patient/family education    PT Next Visit Plan  start gym exercises and walking    Consulted and Agree with Plan of Care  Patient       Patient will benefit from skilled therapeutic intervention in order to improve the following deficits and impairments:  Abnormal gait, Decreased range of motion, Difficulty walking, Decreased activity tolerance, Pain, Decreased balance, Decreased mobility, Decreased strength, Increased edema  Visit Diagnosis: Acute pain of left knee - Plan: PT plan of care cert/re-cert  Stiffness of left knee, not elsewhere classified - Plan: PT plan of care cert/re-cert  Difficulty in walking, not elsewhere classified - Plan: PT plan of care cert/re-cert  Localized edema - Plan: PT plan of care cert/re-cert     Problem List Patient Active Problem List   Diagnosis Date Noted  . FHx: dementia 07/28/2017  . Benign essential tremor 07/11/2015  . Hyperlipidemia 07/11/2015  . Back pain 07/10/2015  . Enteritis due to Clostridium difficile 11/11/2014  . Nausea and vomiting 11/10/2014  . History of diverticulitis 11/09/2014    Sumner Boast., PT 02/01/2018, 10:17 AM  [place] [address] [place], [place], [address] Phone: [phone]   Fax:  [phone]  Name: Jodi Medina MRN: [id number] Date of Birth: 1940-06-26

## 2018-02-03 ENCOUNTER — Encounter: Payer: Self-pay | Admitting: Physical Therapy

## 2018-02-03 ENCOUNTER — Ambulatory Visit: Payer: Medicare Other | Admitting: Physical Therapy

## 2018-02-03 DIAGNOSIS — R262 Difficulty in walking, not elsewhere classified: Secondary | ICD-10-CM

## 2018-02-03 DIAGNOSIS — M25662 Stiffness of left knee, not elsewhere classified: Secondary | ICD-10-CM

## 2018-02-03 DIAGNOSIS — M25562 Pain in left knee: Secondary | ICD-10-CM

## 2018-02-03 DIAGNOSIS — R600 Localized edema: Secondary | ICD-10-CM

## 2018-02-03 NOTE — Therapy (Signed)
[place], [place], [address] Phone: [phone]   Fax:  [phone]  Physical Therapy Treatment  Patient Details  Name: Jodi Medina MRN: [id number] Date of Birth: 06/02/1940 Referring Provider: Lorenz Coaster   Encounter Date: 02/03/2018  PT End of Session - 02/03/18 0958    Visit Number  2    Date for PT Re-Evaluation  04/03/18    PT Start Time  0925    PT Stop Time  1020    PT Time Calculation (min)  55 min       Past Medical History:  Diagnosis Date  . Diverticulitis   . High cholesterol   . High triglycerides   . Lactose intolerance   . Skin cancer   . Tremor     Past Surgical History:  Procedure Laterality Date  . BREAST EXCISIONAL BIOPSY Left 1990  . DENTAL SURGERY  2017  . 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 skin cancer removal  1990s  . TONSILLECTOMY     as a child  . WISDOM TOOTH EXTRACTION      There were no vitals filed for this visit.  Subjective Assessment - 02/03/18 0926    Subjective  doing ex atleast 1 x a day at home    Currently in Pain?  Yes    Pain Score  2     Pain Location  Knee    Pain Orientation  Left                       [facility] Adult PT Treatment/Exercise - 02/03/18 0001      Exercises   Exercises  Knee/Hip      Knee/Hip Exercises: Aerobic   Recumbent Bike  5 min full rev    Nustep  L 4 6 min      Knee/Hip Exercises: Machines for Strengthening   Cybex Knee Extension  5# 1 set 10, 1 set 5 pain   cuing for full TKE   Cybex Knee Flexion  15# 2 sets 10      Knee/Hip Exercises: Standing   Terminal Knee Extension  Left;15 reps   3 sec with ball     Knee/Hip Exercises: Seated   Ball Squeeze  15 hold 3 sec    Clamshell with TheraBand  Red      Modalities   Modalities  Electrical Stimulation;Vasopneumatic      Acupuncturist Location  left knee    Electrical Stimulation  Action  IFC    Electrical Stimulation Parameters  supine    Electrical Stimulation Goals  Pain;Edema      Vasopneumatic   Number Minutes Vasopneumatic   15 minutes    Vasopnuematic Location   Knee    Vasopneumatic Pressure  Medium    Vasopneumatic Temperature   34               PT Short Term Goals - 02/01/18 1014      PT SHORT TERM GOAL #1   Title  independent with intitial HEP    Time  2    Period  Weeks    Status  Partially Met        PT Long Term Goals - 02/01/18 1014      PT LONG TERM GOAL #1   Title  understand RICE for home pain and edema relief    Time  8    Period  Weeks    Status  New      PT LONG TERM GOAL #2   Title  increase AROM to 0-120 degree flexion    Time  8    Period  Weeks    Status  New      PT LONG TERM GOAL #3   Title  decrease pain 50%    Time  8    Period  Weeks    Status  New      PT LONG TERM GOAL #4   Title  walk all distances without device    Time  8    Period  Weeks    Status  New            Plan - 02/03/18 2761    Clinical Impression Statement  pt tolerated ther ex fair, increased c/o ant knee pain with ex. cuing with ex for quad activation. added estim today to see if helps with pain    PT Treatment/Interventions  ADLs/Self Care Home Management;Cryotherapy;Electrical Stimulation;Gait training;Stair training;Functional mobility training;Therapeutic activities;Therapeutic exercise;Balance training;Neuromuscular re-education;Manual techniques;Patient/family education    PT Next Visit Plan  assess and progress ex       Patient will benefit from skilled therapeutic intervention in order to improve the following deficits and impairments:  Abnormal gait, Decreased range of motion, Difficulty walking, Decreased activity tolerance, Pain, Decreased balance, Decreased mobility, Decreased strength, Increased edema  Visit Diagnosis: Acute pain of left knee  Stiffness of left knee, not elsewhere classified  Difficulty in  walking, not elsewhere classified  Localized edema     Problem List Patient Active Problem List   Diagnosis Date Noted  . FHx: dementia 07/28/2017  . Benign essential tremor 07/11/2015  . Hyperlipidemia 07/11/2015  . Back pain 07/10/2015  . Enteritis due to Clostridium difficile 11/11/2014  . Nausea and vomiting 11/10/2014  . History of diverticulitis 11/09/2014    Haadi Santellan,ANGIE PTA 02/03/2018, 10:00 AM  [place], [place], [address] Phone: [phone]   Fax:  [phone]  Name: NADEAN MONTANARO MRN: [id number] Date of Birth: 1940-12-03

## 2018-02-08 ENCOUNTER — Ambulatory Visit: Payer: Medicare Other | Admitting: Physical Therapy

## 2018-02-08 ENCOUNTER — Encounter: Payer: Self-pay | Admitting: Physical Therapy

## 2018-02-08 DIAGNOSIS — M25562 Pain in left knee: Secondary | ICD-10-CM | POA: Diagnosis not present

## 2018-02-08 DIAGNOSIS — R262 Difficulty in walking, not elsewhere classified: Secondary | ICD-10-CM

## 2018-02-08 DIAGNOSIS — M25662 Stiffness of left knee, not elsewhere classified: Secondary | ICD-10-CM

## 2018-02-08 DIAGNOSIS — R600 Localized edema: Secondary | ICD-10-CM

## 2018-02-08 NOTE — Therapy (Signed)
[place], [place], [address] Phone: [phone]   Fax:  [phone]  Physical Therapy Treatment  Patient Details  Name: Jodi Medina MRN: [id number] Date of Birth: 1940/10/12 Referring Provider: Lorenz Coaster   Encounter Date: 02/08/2018  PT End of Session - 02/08/18 0952    Visit Number  3    Date for PT Re-Evaluation  04/03/18    PT Start Time  0930    PT Stop Time  1020    PT Time Calculation (min)  50 min       Past Medical History:  Diagnosis Date  . Diverticulitis   . High cholesterol   . High triglycerides   . Lactose intolerance   . Skin cancer   . Tremor     Past Surgical History:  Procedure Laterality Date  . BREAST EXCISIONAL BIOPSY Left 1990  . DENTAL SURGERY  2017  . 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 skin cancer removal  1990s  . TONSILLECTOMY     as a child  . WISDOM TOOTH EXTRACTION      There were no vitals filed for this visit.  Subjective Assessment - 02/08/18 0932    Subjective  "moving slow this morning" points to patella tendo as area of pain. pt verb she felt estim helped some    Currently in Pain?  Yes    Pain Score  1     Pain Location  Knee    Pain Orientation  Right;Anterior         [facility] PT Assessment - 02/08/18 0001      AROM   AROM Assessment Site  Knee    Right/Left Knee  Left    Left Knee Extension  6    Left Knee Flexion  118                   [facility] Adult PT Treatment/Exercise - 02/08/18 0001      Exercises   Exercises  Knee/Hip      Knee/Hip Exercises: Aerobic   Recumbent Bike  5 min full rev    Nustep  L 4 6 min   LE only     Knee/Hip Exercises: Seated   Long Arc Quad  Strengthening;Left;2 sets;10 reps;Weights   3 sec TKE hold   Ball Squeeze  15 hold 3 sec    Clamshell with TheraBand  Red    Other Seated Knee/Hip Exercises  TKE with quad set red tband 2 sets 10    Hamstring Curl   Strengthening;Left;2 sets;15 reps   red tband   Sit to Sand  10 reps;without UE support   cuing for full ext when standing     Electrical Stimulation   Electrical Stimulation Location  left knee    Electrical Stimulation Action  --   IFC   Electrical Stimulation Parameters  supine    Electrical Stimulation Goals  Pain;Edema      Vasopneumatic   Number Minutes Vasopneumatic   15 minutes    Vasopnuematic Location   Knee    Vasopneumatic Pressure  Medium    Vasopneumatic Temperature   34               PT Short Term Goals - 02/08/18 0948      PT SHORT TERM GOAL #1   Title  independent with intitial HEP    Status  Achieved  PT Long Term Goals - 02/08/18 0948      PT LONG TERM GOAL #1   Title  understand RICE for home pain and edema relief    Status  Partially Met      PT LONG TERM GOAL #2   Title  increase AROM to 0-120 degree flexion    Status  Partially Met      PT LONG TERM GOAL #3   Title  decrease pain 50%    Status  On-going      PT LONG TERM GOAL #4   Title  walk all distances without device    Status  Partially Met            Plan - 02/08/18 0953    Clinical Impression Statement  pt tolerated ther ex well, pt felt ex without machines was better- fearful of reinjury. pt rates pain low but c/o pain through session and shooting pain in patella area. cuing with ex for full TKE and engaing quad. excellent increase in AROM and progressing with goals.    PT Treatment/Interventions  ADLs/Self Care Home Management;Cryotherapy;Electrical Stimulation;Gait training;Stair training;Functional mobility training;Therapeutic activities;Therapeutic exercise;Balance training;Neuromuscular re-education;Manual techniques;Patient/family education    PT Next Visit Plan  progress strength and func       Patient will benefit from skilled therapeutic intervention in order to improve the following deficits and impairments:  Abnormal gait, Decreased range of motion,  Difficulty walking, Decreased activity tolerance, Pain, Decreased balance, Decreased mobility, Decreased strength, Increased edema  Visit Diagnosis: Acute pain of left knee  Stiffness of left knee, not elsewhere classified  Difficulty in walking, not elsewhere classified  Localized edema     Problem List Patient Active Problem List   Diagnosis Date Noted  . FHx: dementia 07/28/2017  . Benign essential tremor 07/11/2015  . Hyperlipidemia 07/11/2015  . Back pain 07/10/2015  . Enteritis due to Clostridium difficile 11/11/2014  . Nausea and vomiting 11/10/2014  . History of diverticulitis 11/09/2014    PAYSEUR,ANGIE PTA 02/08/2018, 9:55 AM  [place], [place], [address] Phone: [phone]   Fax:  [phone]  Name: CRISTELLA STIVER MRN: [id number] Date of Birth: [birth year]

## 2018-02-10 ENCOUNTER — Encounter: Payer: Self-pay | Admitting: Physical Therapy

## 2018-02-10 ENCOUNTER — Ambulatory Visit: Payer: Medicare Other | Admitting: Physical Therapy

## 2018-02-10 DIAGNOSIS — M25662 Stiffness of left knee, not elsewhere classified: Secondary | ICD-10-CM

## 2018-02-10 DIAGNOSIS — R600 Localized edema: Secondary | ICD-10-CM

## 2018-02-10 DIAGNOSIS — M25562 Pain in left knee: Secondary | ICD-10-CM | POA: Diagnosis not present

## 2018-02-10 DIAGNOSIS — R262 Difficulty in walking, not elsewhere classified: Secondary | ICD-10-CM

## 2018-02-10 NOTE — Therapy (Signed)
[place], [place], [address] Phone: [phone]   Fax:  [phone]  Physical Therapy Treatment  Patient Details  Name: Jodi Medina MRN: [id number] Date of Birth: 1940/07/14 Referring Provider: Lorenz Coaster   Encounter Date: 02/10/2018  PT End of Session - 02/10/18 0924    Visit Number  4    Date for PT Re-Evaluation  04/03/18    PT Start Time  0845    PT Stop Time  0939    PT Time Calculation (min)  54 min    Activity Tolerance  Patient tolerated treatment well    Behavior During Therapy  [facility] for tasks assessed/performed       Past Medical History:  Diagnosis Date  . Diverticulitis   . High cholesterol   . High triglycerides   . Lactose intolerance   . Skin cancer   . Tremor     Past Surgical History:  Procedure Laterality Date  . BREAST EXCISIONAL BIOPSY Left 1990  . DENTAL SURGERY  2017  . 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 skin cancer removal  1990s  . TONSILLECTOMY     as a child  . WISDOM TOOTH EXTRACTION      There were no vitals filed for this visit.  Subjective Assessment - 02/10/18 0849    Subjective  Points to L patella ligament    Currently in Pain?  Yes   walking in   Pain Score  4     Pain Location  Knee    Pain Orientation  Left         [facility] PT Assessment - 02/10/18 0001      AROM   AROM Assessment Site  Knee    Right/Left Knee  Left    Left Knee Extension  6    Left Knee Flexion  118                   [facility] Adult PT Treatment/Exercise - 02/10/18 0001      Exercises   Exercises  Knee/Hip      Knee/Hip Exercises: Aerobic   Recumbent Bike  5 min full rev    Nustep  L 4 5 min      Knee/Hip Exercises: Standing   Forward Step Up  Left;1 set;10 reps;Step Height: 4";Hand Hold: 1      Knee/Hip Exercises: Seated   Long Arc Quad  Strengthening;Left;2 sets;10 reps;Weights    Long Arc Quad Weight  2 lbs.    Ball  Squeeze  15 hold 3 sec    Hamstring Curl  Strengthening;Left;2 sets;15 reps    Abduction/Adduction   15 reps;2 sets;Both    Abd/Adduction Limitations  red    Sit to [employer]  10 reps;without UE support      Acupuncturist Location  left knee    Electrical Stimulation Action  IFC    Electrical Stimulation Parameters  supine    Electrical Stimulation Goals  Pain;Edema      Vasopneumatic   Number Minutes Vasopneumatic   15 minutes    Vasopnuematic Location   Knee    Vasopneumatic Pressure  Medium    Vasopneumatic Temperature   34               PT Short Term Goals - 02/08/18 0948      PT SHORT TERM GOAL #1   Title  independent with  intitial HEP    Status  Achieved        PT Long Term Goals - 02/08/18 0948      PT LONG TERM GOAL #1   Title  understand RICE for home pain and edema relief    Status  Partially Met      PT LONG TERM GOAL #2   Title  increase AROM to 0-120 degree flexion    Status  Partially Met      PT LONG TERM GOAL #3   Title  decrease pain 50%    Status  On-going      PT LONG TERM GOAL #4   Title  walk all distances without device    Status  Partially Met            Plan - 02/10/18 0924    Clinical Impression Statement  Pt enters clinic reporting pain below L patella, no pain during the NuStep Warm up. She did have one occurrence of shooting pain after doing the seated HS curls with Tband resistance. Cues to fully contract quad with LAQ. Pt is lacking some L knee extension.    PT Frequency  2x / week    PT Duration  8 weeks    PT Next Visit Plan  progress strength and func, Pt goes to MD today       Patient will benefit from skilled therapeutic intervention in order to improve the following deficits and impairments:  Abnormal gait, Decreased range of motion, Difficulty walking, Decreased activity tolerance, Pain, Decreased balance, Decreased mobility, Decreased strength, Increased edema  Visit  Diagnosis: Acute pain of left knee  Stiffness of left knee, not elsewhere classified  Difficulty in walking, not elsewhere classified  Localized edema     Problem List Patient Active Problem List   Diagnosis Date Noted  . FHx: dementia 07/28/2017  . Benign essential tremor 07/11/2015  . Hyperlipidemia 07/11/2015  . Back pain 07/10/2015  . Enteritis due to Clostridium difficile 11/11/2014  . Nausea and vomiting 11/10/2014  . History of diverticulitis 11/09/2014    Scot Jun,  PTA 02/10/2018, 9:27 AM  [place], [place], [address] Phone: [phone]   Fax:  [phone]  Name: Jodi Medina MRN: [id number] Date of Birth: 1941/02/27

## 2018-02-14 ENCOUNTER — Ambulatory Visit: Payer: Medicare Other | Admitting: Physical Therapy

## 2018-02-14 ENCOUNTER — Encounter: Payer: Self-pay | Admitting: Physical Therapy

## 2018-02-14 DIAGNOSIS — M25562 Pain in left knee: Secondary | ICD-10-CM | POA: Diagnosis not present

## 2018-02-14 DIAGNOSIS — R600 Localized edema: Secondary | ICD-10-CM

## 2018-02-14 DIAGNOSIS — R262 Difficulty in walking, not elsewhere classified: Secondary | ICD-10-CM

## 2018-02-14 DIAGNOSIS — M25662 Stiffness of left knee, not elsewhere classified: Secondary | ICD-10-CM

## 2018-02-14 NOTE — Therapy (Signed)
[place], [place], [address] Phone: [phone]   Fax:  [phone]  Physical Therapy Treatment  Patient Details  Name: Jodi Medina MRN: [id number] Date of Birth: 24-Jun-1940 Referring Provider: Lorenz Coaster   Encounter Date: 02/14/2018  PT End of Session - 02/14/18 1007    Visit Number  5    Date for PT Re-Evaluation  04/03/18    PT Start Time  0930    PT Stop Time  1020    PT Time Calculation (min)  50 min    Activity Tolerance  Patient tolerated treatment well    Behavior During Therapy  [facility] for tasks assessed/performed       Past Medical History:  Diagnosis Date  . Diverticulitis   . High cholesterol   . High triglycerides   . Lactose intolerance   . Skin cancer   . Tremor     Past Surgical History:  Procedure Laterality Date  . BREAST EXCISIONAL BIOPSY Left 1990  . DENTAL SURGERY  2017  . 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 skin cancer removal  1990s  . TONSILLECTOMY     as a child  . WISDOM TOOTH EXTRACTION      There were no vitals filed for this visit.  Subjective Assessment - 02/14/18 0930    Subjective  Pt reports that she has been trying to walk more.    Currently in Pain?  No/denies    Pain Score  0-No pain                       [facility] Adult PT Treatment/Exercise - 02/14/18 0001      Exercises   Exercises  Knee/Hip      Knee/Hip Exercises: Aerobic   Recumbent Bike  5 min full rev    Nustep  L2 x5 min LE only      Knee/Hip Exercises: Machines for Strengthening   Cybex Knee Flexion  15# 2 sets 10      Knee/Hip Exercises: Standing   Heel Raises  Both;2 sets;10 reps;2 seconds    Forward Step Up  Left;1 set;10 reps;Hand Hold: 1;Step Height: 6"      Knee/Hip Exercises: Seated   Sit to Sand  10 reps;without UE support   x2     Knee/Hip Exercises: Supine   Short Arc Quad Sets  2 sets;Left;10 reps    Short Arc Quad Sets  Limitations  1.5      Modalities   Modalities  Cryotherapy      Cryotherapy   Number Minutes Cryotherapy  10 Minutes    Cryotherapy Location  Knee    Type of Cryotherapy  Ice pack               PT Short Term Goals - 02/08/18 0948      PT SHORT TERM GOAL #1   Title  independent with intitial HEP    Status  Achieved        PT Long Term Goals - 02/08/18 0948      PT LONG TERM GOAL #1   Title  understand RICE for home pain and edema relief    Status  Partially Met      PT LONG TERM GOAL #2   Title  increase AROM to 0-120 degree flexion    Status  Partially Met      PT LONG TERM GOAL #3  Title  decrease pain 50%    Status  On-going      PT LONG TERM GOAL #4   Title  walk all distances without device    Status  Partially Met            Plan - 02/14/18 1007    Clinical Impression Statement  Pt stated that MD was pleased with her progress. All of today's activities completed well. Reports some pull across the knee on the recumbent bike. Some weakness presented with heel raises. Pt voiced that the leg curs and extensions has not been her favorite machines.  LLE did fatigue quick with step ups.    PT Frequency  2x / week    PT Duration  8 weeks    PT Treatment/Interventions  ADLs/Self Care Home Management;Cryotherapy;Electrical Stimulation;Gait training;Stair training;Functional mobility training;Therapeutic activities;Therapeutic exercise;Balance training;Neuromuscular re-education;Manual techniques;Patient/family education    PT Next Visit Plan  Continue to progress LE strength and ROM       Patient will benefit from skilled therapeutic intervention in order to improve the following deficits and impairments:  Abnormal gait, Decreased range of motion, Difficulty walking, Decreased activity tolerance, Pain, Decreased balance, Decreased mobility, Decreased strength, Increased edema  Visit Diagnosis: Acute pain of left knee  Stiffness of left knee, not  elsewhere classified  Difficulty in walking, not elsewhere classified  Localized edema     Problem List Patient Active Problem List   Diagnosis Date Noted  . FHx: dementia 07/28/2017  . Benign essential tremor 07/11/2015  . Hyperlipidemia 07/11/2015  . Back pain 07/10/2015  . Enteritis due to Clostridium difficile 11/11/2014  . Nausea and vomiting 11/10/2014  . History of diverticulitis 11/09/2014    Scot Jun, PTA 02/14/2018, 10:10 AM  [place], [place], [address] Phone: [phone]   Fax:  [phone]  Name: Jodi Medina MRN: [id number] Date of Birth: [birth year]

## 2018-02-17 ENCOUNTER — Ambulatory Visit: Payer: Medicare Other | Admitting: Physical Therapy

## 2018-02-17 DIAGNOSIS — R262 Difficulty in walking, not elsewhere classified: Secondary | ICD-10-CM

## 2018-02-17 DIAGNOSIS — M25562 Pain in left knee: Secondary | ICD-10-CM | POA: Diagnosis not present

## 2018-02-17 DIAGNOSIS — M25662 Stiffness of left knee, not elsewhere classified: Secondary | ICD-10-CM

## 2018-02-17 NOTE — Therapy (Signed)
[facility] [address] [place], [place], [address] Phone: [phone]   Fax:  [phone]  Physical Therapy Treatment  Patient Details  Name: Jodi Medina MRN: [id number] Date of Birth: 06/26/1940 Referring Provider: Caffery   Encounter Date: 02/17/2018  PT End of Session - 02/17/18 1002    Visit Number  6    Date for PT Re-Evaluation  04/03/18    PT Start Time  0930    PT Stop Time  1015    PT Time Calculation (min)  45 min       Past Medical History:  Diagnosis Date  . Diverticulitis   . High cholesterol   . High triglycerides   . Lactose intolerance   . Skin cancer   . Tremor     Past Surgical History:  Procedure Laterality Date  . BREAST EXCISIONAL BIOPSY Left 1990  . DENTAL SURGERY  2017  . 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 skin cancer removal  1990s  . TONSILLECTOMY     as a child  . WISDOM TOOTH EXTRACTION      There were no vitals filed for this visit.  Subjective Assessment - 02/17/18 0935    Subjective  catching in medial knee, c/o buckling sensation- pt verb this is new and does not rememer doing anything different. NO PIAN unless catch "hits me"    Currently in Pain?  No/denies         [facility] PT Assessment - 02/17/18 0001      AROM   AROM Assessment Site  Knee    Right/Left Knee  Left    Left Knee Extension  3    Left Knee Flexion  120                   [facility] Adult PT Treatment/Exercise - 02/17/18 0001      Modalities   Modalities  Iontophoresis      Iontophoresis   Type of Iontophoresis  Dexamethasone    Location  medial jt line    Dose  1.1.cc    Time  4 hour leave on patch             PT Education - 02/17/18 0941    Education Details  seated LAQ,hip flex,abd red tband,ball squeeze. standing hip 3 way red tband    Person(s) Educated  Patient    Methods  Explanation;Demonstration;Verbal cues;Handout    Comprehension   Verbalized understanding;Returned demonstration;Verbal cues required       PT Short Term Goals - 02/08/18 0948      PT SHORT TERM GOAL #1   Title  independent with intitial HEP    Status  Achieved        PT Long Term Goals - 02/17/18 1003      PT LONG TERM GOAL #1   Title  understand RICE for home pain and edema relief    Status  Achieved      PT LONG TERM GOAL #2   Title  increase AROM to 0-120 degree flexion    Status  Partially Met      PT LONG TERM GOAL #3   Title  decrease pain 50%    Status  Partially Met      PT LONG TERM GOAL #4   Title  walk all distances without device    Status  Partially Met              Plan - 02/17/18 1002    Clinical Impression Statement  issued HEP as pt may be headed OOT for 2 weeks. medial pain with increased swelling noted, tenderness in jt line. good pat tracking. trila of ionto    PT Treatment/Interventions  ADLs/Self Care Home Management;Cryotherapy;Electrical Stimulation;Gait training;Stair training;Functional mobility training;Therapeutic activities;Therapeutic exercise;Balance training;Neuromuscular re-education;Manual techniques;Patient/family education    PT Next Visit Plan  assess ionto and catch,check on HEP       Patient will benefit from skilled therapeutic intervention in order to improve the following deficits and impairments:  Abnormal gait, Decreased range of motion, Difficulty walking, Decreased activity tolerance, Pain, Decreased balance, Decreased mobility, Decreased strength, Increased edema  Visit Diagnosis: Acute pain of left knee  Stiffness of left knee, not elsewhere classified  Difficulty in walking, not elsewhere classified     Problem List Patient Active Problem List   Diagnosis Date Noted  . FHx: dementia 07/28/2017  . Benign essential tremor 07/11/2015  . Hyperlipidemia 07/11/2015  . Back pain 07/10/2015  . Enteritis due to Clostridium difficile 11/11/2014  . Nausea and vomiting 11/10/2014   . History of diverticulitis 11/09/2014    Niyla Marone,ANGIE PTA 02/17/2018, 10:04 AM  [place] [address] [place], [place], [address] Phone: [phone]   Fax:  [phone]  Name: MISTY FOUTZ MRN: [id number] Date of Birth: 07/20/1940

## 2018-03-08 ENCOUNTER — Ambulatory Visit: Payer: Medicare Other | Attending: Orthopedic Surgery | Admitting: Physical Therapy

## 2018-03-08 DIAGNOSIS — R262 Difficulty in walking, not elsewhere classified: Secondary | ICD-10-CM | POA: Insufficient documentation

## 2018-03-08 DIAGNOSIS — M25662 Stiffness of left knee, not elsewhere classified: Secondary | ICD-10-CM | POA: Insufficient documentation

## 2018-03-08 DIAGNOSIS — R600 Localized edema: Secondary | ICD-10-CM | POA: Diagnosis present

## 2018-03-08 DIAGNOSIS — M25562 Pain in left knee: Secondary | ICD-10-CM

## 2018-03-08 NOTE — Therapy (Signed)
[place], [place], [address] Phone: [phone]   Fax:  [phone]  Physical Therapy Treatment  Patient Details  Name: Jodi Medina MRN: [id number] Date of Birth: 07/25/1940 Referring Provider (PT): Caffery   Encounter Date: 03/08/2018  PT End of Session - 03/08/18 0957    Visit Number  7    Date for PT Re-Evaluation  04/03/18    PT Start Time  0930    PT Stop Time  1018    PT Time Calculation (min)  48 min       Past Medical History:  Diagnosis Date  . Diverticulitis   . High cholesterol   . High triglycerides   . Lactose intolerance   . Skin cancer   . Tremor     Past Surgical History:  Procedure Laterality Date  . BREAST EXCISIONAL BIOPSY Left 1990  . DENTAL SURGERY  2017  . 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 skin cancer removal  1990s  . TONSILLECTOMY     as a child  . WISDOM TOOTH EXTRACTION      There were no vitals filed for this visit.  Subjective Assessment - 03/08/18 0933    Subjective  I have been OOT , knee just getting back to where I was 3 weeks ago. Still catch in knee. no pain since Saturday.    Currently in Pain?  No/denies         [facility] PT Assessment - 03/08/18 0001      AROM   AROM Assessment Site  Knee    Right/Left Knee  Left    Left Knee Extension  0    Left Knee Flexion  120      Strength   Overall Strength Comments  4+/5      Palpation   Palpation comment  medial tenderness and swelling                   [facility] Adult PT Treatment/Exercise - 03/08/18 0001      Exercises   Exercises  Knee/Hip      Knee/Hip Exercises: Aerobic   Recumbent Bike  6 min full rev    Nustep  L2 x6 min LE only      Knee/Hip Exercises: Standing   Knee Flexion  Strengthening;Right;2 sets;10 reps   2#     Knee/Hip Exercises: Seated   Long Arc Quad  Strengthening;Left;2 sets;10 reps;Weights   with ball squeeze   Long Arc  Quad Weight  2 lbs.    Other Seated Knee/Hip Exercises  TKE with quad set green tband 2 sets 10      Electrical Stimulation   Electrical Stimulation Location  left knee    Electrical Stimulation Action  IFC    Electrical Stimulation Parameters  supine    Electrical Stimulation Goals  Pain;Edema      Vasopneumatic   Number Minutes Vasopneumatic   15 minutes    Vasopnuematic Location   Knee    Vasopneumatic Pressure  Medium    Vasopneumatic Temperature   34               PT Short Term Goals - 02/08/18 0948      PT SHORT TERM GOAL #1   Title  independent with intitial HEP    Status  Achieved        PT Long Term Goals - 03/08/18 7858  PT LONG TERM GOAL #1   Title  understand RICE for home pain and edema relief    Status  Achieved      PT LONG TERM GOAL #2   Status  Achieved      PT LONG TERM GOAL #3   Title  decrease pain 50%    Baseline  pt verb flare up 3 weeks ago, just nack to where she was prior to f/u. denies pain but does have "catch" ,medial tenderness and swelling    Status  Partially Met      PT LONG TERM GOAL #4   Title  walk all distances without device    Baseline  amb outside home with [facility] d/t "catch" in knee    Status  Partially Met            Plan - 03/08/18 0940    Clinical Impression Statement  last time pt was seen she was having increased pain and verb jsut getting back to baseline. pt denies pain or catch at times however PTA witnessed 'Catch" 2 times. pt with medial tenderness and swelling i nleft knee. proressing with goals.    PT Treatment/Interventions  ADLs/Self Care Home Management;Cryotherapy;Electrical Stimulation;Gait training;Stair training;Functional mobility training;Therapeutic activities;Therapeutic exercise;Balance training;Neuromuscular re-education;Manual techniques;Patient/family education    PT Next Visit Plan  MD 10/21       Patient will benefit from skilled therapeutic intervention in order to improve the  following deficits and impairments:  Abnormal gait, Decreased range of motion, Difficulty walking, Decreased activity tolerance, Pain, Decreased balance, Decreased mobility, Decreased strength, Increased edema  Visit Diagnosis: Acute pain of left knee  Stiffness of left knee, not elsewhere classified  Difficulty in walking, not elsewhere classified  Localized edema     Problem List Patient Active Problem List   Diagnosis Date Noted  . FHx: dementia 07/28/2017  . Benign essential tremor 07/11/2015  . Hyperlipidemia 07/11/2015  . Back pain 07/10/2015  . Enteritis due to Clostridium difficile 11/11/2014  . Nausea and vomiting 11/10/2014  . History of diverticulitis 11/09/2014    Jodi Medina,ANGIE PTA 03/08/2018, 9:58 AM  [place], [place], [address] Phone: [phone]   Fax:  [phone]  Name: Jodi Medina MRN: [id number] Date of Birth: [birth year]

## 2018-03-15 ENCOUNTER — Ambulatory Visit: Payer: Medicare Other | Admitting: Physical Therapy

## 2018-03-15 DIAGNOSIS — M25662 Stiffness of left knee, not elsewhere classified: Secondary | ICD-10-CM

## 2018-03-15 DIAGNOSIS — M25562 Pain in left knee: Secondary | ICD-10-CM | POA: Diagnosis not present

## 2018-03-15 DIAGNOSIS — R600 Localized edema: Secondary | ICD-10-CM

## 2018-03-15 DIAGNOSIS — R262 Difficulty in walking, not elsewhere classified: Secondary | ICD-10-CM

## 2018-03-15 NOTE — Therapy (Signed)
[facility] [address] [place], , [address] Phone: [phone]   Fax:  [phone]  Physical Therapy Treatment  Patient Details  Name: Jodi Medina MRN: [id number] Date of Birth: 01/05/1941 Referring Provider (PT): Caffery   Encounter Date: 03/15/2018  PT End of Session - 03/15/18 0923    Visit Number  8    Date for PT Re-Evaluation  04/03/18    PT Start Time  0850    PT Stop Time  0943    PT Time Calculation (min)  53 min       Past Medical History:  Diagnosis Date  . Diverticulitis   . High cholesterol   . High triglycerides   . Lactose intolerance   . Skin cancer   . Tremor     Past Surgical History:  Procedure Laterality Date  . BREAST EXCISIONAL BIOPSY Left 1990  . DENTAL SURGERY  2017  . 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 skin cancer removal  1990s  . TONSILLECTOMY     as a child  . WISDOM TOOTH EXTRACTION      There were no vitals filed for this visit.  Subjective Assessment - 03/15/18 0852    Subjective  no catching, doing better. MD said it was normal and started me back on anti inflammatories. Pt amb in with [facility]    Currently in Pain?  No/denies                       [facility] Adult PT Treatment/Exercise - 03/15/18 0001      Exercises   Exercises  Knee/Hip      Knee/Hip Exercises: Aerobic   Recumbent Bike  6 min full rev   very stiff at start   Nustep  L4 x6 min LE only      Knee/Hip Exercises: Standing   Other Standing Knee Exercises  hip 3 way 15 times each BIL    Other Standing Knee Exercises  3# hip flex with knee ext 2 sets 10      Knee/Hip Exercises: Seated   Long Arc Quad  Strengthening;Left;2 sets;10 reps;Weights    Long Arc Quad Weight  3 lbs.    Abduction/Adduction   Strengthening;Left;2 sets;10 reps   with hip flex 3#   Sit to Sand  10 reps;without UE support   from mat with wt ball     Electrical Stimulation   Electrical  Stimulation Location  left knee    Electrical Stimulation Action  IFC    Electrical Stimulation Parameters  supine    Electrical Stimulation Goals  Pain;Edema      Vasopneumatic   Number Minutes Vasopneumatic   15 minutes    Vasopnuematic Location   Knee    Vasopneumatic Pressure  Medium    Vasopneumatic Temperature   34               PT Short Term Goals - 02/08/18 0948      PT SHORT TERM GOAL #1   Title  independent with intitial HEP    Status  Achieved        PT Long Term Goals - 03/08/18 0939      PT LONG TERM GOAL #1   Title  understand RICE for home pain and edema relief    Status  Achieved      PT LONG TERM GOAL #2   Status  Achieved        PT LONG TERM GOAL #3   Title  decrease pain 50%    Baseline  pt verb flare up 3 weeks ago, just nack to where she was prior to f/u. denies pain but does have "catch" ,medial tenderness and swelling    Status  Partially Met      PT LONG TERM GOAL #4   Title  walk all distances without device    Baseline  amb outside home with [facility] d/t "catch" in knee    Status  Partially Met            Plan - 03/15/18 0923    Clinical Impression Statement  pt tolerated increased ther ex well but did need cuing for control of mvmt and to decrease compensation. pt responds well to IFC and vaso for inflammation    PT Treatment/Interventions  ADLs/Self Care Home Management;Cryotherapy;Electrical Stimulation;Gait training;Stair training;Functional mobility training;Therapeutic activities;Therapeutic exercise;Balance training;Neuromuscular re-education;Manual techniques;Patient/family education    PT Next Visit Plan  progress ther ex fro strength       Patient will benefit from skilled therapeutic intervention in order to improve the following deficits and impairments:  Abnormal gait, Decreased range of motion, Difficulty walking, Decreased activity tolerance, Pain, Decreased balance, Decreased mobility, Decreased strength, Increased  edema  Visit Diagnosis: Stiffness of left knee, not elsewhere classified  Difficulty in walking, not elsewhere classified  Localized edema     Problem List Patient Active Problem List   Diagnosis Date Noted  . FHx: dementia 07/28/2017  . Benign essential tremor 07/11/2015  . Hyperlipidemia 07/11/2015  . Back pain 07/10/2015  . Enteritis due to Clostridium difficile 11/11/2014  . Nausea and vomiting 11/10/2014  . History of diverticulitis 11/09/2014    PAYSEUR,ANGIE PTA 03/15/2018, 9:26 AM  [facility] [address] [place], [place], [address] Phone: [phone]   Fax:  [phone]  Name: Rabecka L Myers MRN: [id number] Date of Birth: 08/01/1940   

## 2018-03-29 ENCOUNTER — Ambulatory Visit: Payer: Medicare Other | Admitting: Physical Therapy

## 2018-04-05 ENCOUNTER — Ambulatory Visit: Payer: Medicare Other | Attending: Orthopedic Surgery | Admitting: Physical Therapy

## 2018-04-05 DIAGNOSIS — R262 Difficulty in walking, not elsewhere classified: Secondary | ICD-10-CM | POA: Insufficient documentation

## 2018-04-05 DIAGNOSIS — R600 Localized edema: Secondary | ICD-10-CM

## 2018-04-05 DIAGNOSIS — M25662 Stiffness of left knee, not elsewhere classified: Secondary | ICD-10-CM | POA: Diagnosis not present

## 2018-04-05 DIAGNOSIS — M25562 Pain in left knee: Secondary | ICD-10-CM | POA: Diagnosis present

## 2018-04-05 NOTE — Therapy (Signed)
[place] [address] [place], [place], [address] Phone: [phone]   Fax:  [phone]  Physical Therapy Treatment  Patient Details  Name: Jodi Medina MRN: [id number] Date of Birth: 1941/04/14 Referring Provider (PT): Caffery   Encounter Date: 04/05/2018  PT End of Session - 04/05/18 1353    Visit Number  9    Date for PT Re-Evaluation  04/03/18    PT Start Time  1307    PT Stop Time  1403    PT Time Calculation (min)  56 min    Activity Tolerance  Patient tolerated treatment well    Behavior During Therapy  [facility] for tasks assessed/performed       Past Medical History:  Diagnosis Date  . Diverticulitis   . High cholesterol   . High triglycerides   . Lactose intolerance   . Skin cancer   . Tremor     Past Surgical History:  Procedure Laterality Date  . BREAST EXCISIONAL BIOPSY Left 1990  . DENTAL SURGERY  2017  . 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 skin cancer removal  1990s  . TONSILLECTOMY     as a child  . WISDOM TOOTH EXTRACTION      There were no vitals filed for this visit.  Subjective Assessment - 04/05/18 1308    Subjective  "Im doing about the same, no catch, but not where I really want to be"    Currently in Pain?  No/denies         [facility] PT Assessment - 04/05/18 0001      AROM   AROM Assessment Site  Knee    Right/Left Knee  Left    Left Knee Extension  0    Left Knee Flexion  118      Strength   Overall Strength Comments  4+/5                   [facility] Adult PT Treatment/Exercise - 04/05/18 0001      Exercises   Exercises  Knee/Hip      Knee/Hip Exercises: Aerobic   Recumbent Bike  6 min full rev      Knee/Hip Exercises: Machines for Strengthening   Cybex Knee Extension  5# 2x10    Cybex Knee Flexion  10# 1x10, 15# 2x10      Knee/Hip Exercises: Standing   Heel Raises  Both;2 sets;10 reps;3 seconds   3 lb dumbells   Lateral Step  Up  Left;2 sets;10 reps;Step Height: 4"    Forward Step Up  Left;2 sets;10 reps;Step Height: 4"    Other Standing Knee Exercises  Lateral Band walks (red Tband), Marching leaning on wall 2x10 bilateral 2.5 lb ankle weights    Other Standing Knee Exercises  Hip 3 way, red Tband,       Knee/Hip Exercises: Seated   Sit to Sand  without UE support;20 reps   holding red ball     Modalities   Modalities  Vasopneumatic      Vasopneumatic   Number Minutes Vasopneumatic   15 minutes    Vasopnuematic Location   Knee    Vasopneumatic Pressure  Medium    Vasopneumatic Temperature   34               PT Short Term Goals - 02/08/18 0948      PT SHORT TERM GOAL #1   Title  independent  with intitial HEP    Status  Achieved        PT Long Term Goals - 04/05/18 1358      PT LONG TERM GOAL #3   Title  decrease pain 50%    Time  8    Period  Weeks    Status  Achieved            Plan - 04/05/18 1354    Clinical Impression Statement  Pt tolerated progression of exercises well today with no advers affects. Pt ambulated in between machines and exercises without cane. Pt required min VC for body machanics and to keep toes pointed forward during lateral walks and step ups. Pt required close supervision during heel off exercises due to unsteadiness.Pt continues to progress slowly towards all therapy goals at this time.     PT Frequency  2x / week    PT Duration  8 weeks    PT Treatment/Interventions  ADLs/Self Care Home Management;Cryotherapy;Electrical Stimulation;Gait training;Stair training;Functional mobility training;Therapeutic activities;Therapeutic exercise;Balance training;Neuromuscular re-education;Manual techniques;Patient/family education    PT Next Visit Plan  Progress strengthening exercises    Consulted and Agree with Plan of Care  Patient       Patient will benefit from skilled therapeutic intervention in order to improve the following deficits and impairments:   Abnormal gait, Decreased range of motion, Difficulty walking, Decreased activity tolerance, Pain, Decreased balance, Decreased mobility, Decreased strength, Increased edema  Visit Diagnosis: Stiffness of left knee, not elsewhere classified  Difficulty in walking, not elsewhere classified  Localized edema  Acute pain of left knee     Problem List Patient Active Problem List   Diagnosis Date Noted  . FHx: dementia 07/28/2017  . Benign essential tremor 07/11/2015  . Hyperlipidemia 07/11/2015  . Back pain 07/10/2015  . Enteritis due to Clostridium difficile 11/11/2014  . Nausea and vomiting 11/10/2014  . History of diverticulitis 11/09/2014    Howell Rucks, SPTA 04/05/2018, 2:03 PM  [place] [address] [place], [place], [address] Phone: [phone]   Fax:  [phone]  Name: Jodi Medina MRN: [id number] Date of Birth: Oct 23, 1940

## 2018-04-11 ENCOUNTER — Ambulatory Visit: Payer: Medicare Other | Admitting: Physical Therapy

## 2018-04-11 DIAGNOSIS — M25662 Stiffness of left knee, not elsewhere classified: Secondary | ICD-10-CM | POA: Diagnosis not present

## 2018-04-11 DIAGNOSIS — R262 Difficulty in walking, not elsewhere classified: Secondary | ICD-10-CM

## 2018-04-11 NOTE — Therapy (Signed)
[address] [place], [place], [address] Phone: [phone]   Fax:  [phone]  Physical Therapy Treatment  Patient Details  Name: Jodi Medina MRN: [id number] Date of Birth: 01-27-1941 Referring Provider (PT): Caffery  Progress Note Reporting Period 02/01/18 to 04/11/18  See note below for Objective Data and Assessment of Progress/Goals.      Encounter Date: 04/11/2018  PT End of Session - 04/11/18 0907    Visit Number  10    Date for PT Re-Evaluation  04/03/18    PT Start Time  0847    PT Stop Time  0944    PT Time Calculation (min)  57 min    Activity Tolerance  Patient tolerated treatment well    Behavior During Therapy  [facility] for tasks assessed/performed       Past Medical History:  Diagnosis Date  . Diverticulitis   . High cholesterol   . High triglycerides   . Lactose intolerance   . Skin cancer   . Tremor     Past Surgical History:  Procedure Laterality Date  . BREAST EXCISIONAL BIOPSY Left 1990  . DENTAL SURGERY  2017  . 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 skin cancer removal  1990s  . TONSILLECTOMY     as a child  . WISDOM TOOTH EXTRACTION      There were no vitals filed for this visit.  Subjective Assessment - 04/11/18 0904    Subjective  Patient reports no catching. Still using cane in the community. She does not use in the house.    Patient Stated Goals  have better motion, less pain, walk better    Currently in Pain?  No/denies         [facility] PT Assessment - 04/11/18 0001      Observation/Other Assessments   Focus on Therapeutic Outcomes (FOTO)   45% limited                   [facility] Adult PT Treatment/Exercise - 04/11/18 0001      Knee/Hip Exercises: Aerobic   Recumbent Bike  6 min full rev      Knee/Hip Exercises: Machines for Strengthening   Cybex Knee Extension  10#     Cybex Knee Flexion  15# 3x10      Knee/Hip Exercises:  Standing   Lateral Step Up  Left;2 sets;10 reps;Step Height: 6"    Forward Step Up  Left;2 sets;10 reps;Step Height: 6"    Other Standing Knee Exercises  hip 3 way 20 times each BIL; red band for ext and flex       Knee/Hip Exercises: Seated   Sit to Sand  without UE support;20 reps   holding yellow ball     Modalities   Modalities  Vasopneumatic      Vasopneumatic   Number Minutes Vasopneumatic   15 minutes    Vasopnuematic Location   Knee    Vasopneumatic Pressure  Medium    Vasopneumatic Temperature   34               PT Short Term Goals - 02/08/18 0948      PT SHORT TERM GOAL #1   Title  independent with intitial HEP    Status  Achieved        PT Long Term Goals - 04/11/18 0908      PT LONG TERM GOAL #1  Title  understand RICE for home pain and edema relief    Time  8    Period  Weeks    Status  Achieved      PT LONG TERM GOAL #2   Title  increase AROM to 0-120 degree flexion    Time  8    Period  Weeks    Status  Achieved      PT LONG TERM GOAL #3   Title  decrease pain 50%    Time  8    Period  Weeks    Status  Achieved      PT LONG TERM GOAL #4   Title  walk all distances without device    Baseline  still uses in the community    Time  8    Period  Weeks    Status  Partially Met            Plan - 04/11/18 1246    Clinical Impression Statement  Patient is progressing well in therapy. She has met all of her LTGs except being able to safely walk in the community without an AD. She continues to demonstrate hip weakness Left>Right with SLS activities and lacks confidence with stairs. She will benefit from further PT to address these deficits.    PT Frequency  2x / week    PT Duration  8 weeks    PT Treatment/Interventions  ADLs/Self Care Home Management;Cryotherapy;Electrical Stimulation;Gait training;Stair training;Functional mobility training;Therapeutic activities;Therapeutic exercise;Balance training;Neuromuscular re-education;Manual  techniques;Patient/family education    PT Next Visit Plan  continue with SLS activities for strength and balance and work on stairs/steps.    Consulted and Agree with Plan of Care  Patient       Patient will benefit from skilled therapeutic intervention in order to improve the following deficits and impairments:  Abnormal gait, Decreased range of motion, Difficulty walking, Decreased activity tolerance, Pain, Decreased balance, Decreased mobility, Decreased strength, Increased edema  Visit Diagnosis: Stiffness of left knee, not elsewhere classified  Difficulty in walking, not elsewhere classified     Problem List Patient Active Problem List   Diagnosis Date Noted  . FHx: dementia 07/28/2017  . Benign essential tremor 07/11/2015  . Hyperlipidemia 07/11/2015  . Back pain 07/10/2015  . Enteritis due to Clostridium difficile 11/11/2014  . Nausea and vomiting 11/10/2014  . History of diverticulitis 11/09/2014    Madelyn Flavors PT 04/11/2018, 12:49 PM  [address] [place], [place], [address] Phone: [phone]   Fax:  [phone]  Name: Jodi Medina MRN: [id number] Date of Birth: 03/10/1941

## 2018-04-26 ENCOUNTER — Ambulatory Visit: Payer: Medicare Other | Attending: Orthopedic Surgery | Admitting: Physical Therapy

## 2018-04-26 DIAGNOSIS — M25662 Stiffness of left knee, not elsewhere classified: Secondary | ICD-10-CM | POA: Diagnosis not present

## 2018-04-26 DIAGNOSIS — M25562 Pain in left knee: Secondary | ICD-10-CM

## 2018-04-26 DIAGNOSIS — R262 Difficulty in walking, not elsewhere classified: Secondary | ICD-10-CM

## 2018-04-26 DIAGNOSIS — R600 Localized edema: Secondary | ICD-10-CM | POA: Diagnosis present

## 2018-04-26 NOTE — Therapy (Signed)
[place] [address] [place], [place], [address] Phone: [phone]   Fax:  [phone]  Physical Therapy Treatment  Patient Details  Name: Jodi Medina MRN: [id number] Date of Birth: 08-04-1940 Referring Provider (PT): Caffery   Encounter Date: 04/26/2018  PT End of Session - 04/26/18 0857    Visit Number  11    Date for PT Re-Evaluation  05/05/18    PT Start Time  0855    PT Stop Time  0945    PT Time Calculation (min)  50 min    Activity Tolerance  Patient limited by pain    Behavior During Therapy  [facility] for tasks assessed/performed       Past Medical History:  Diagnosis Date  . Diverticulitis   . High cholesterol   . High triglycerides   . Lactose intolerance   . Skin cancer   . Tremor     Past Surgical History:  Procedure Laterality Date  . BREAST EXCISIONAL BIOPSY Left 1990  . DENTAL SURGERY  2017  . 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 skin cancer removal  1990s  . TONSILLECTOMY     as a child  . WISDOM TOOTH EXTRACTION      There were no vitals filed for this visit.  Subjective Assessment - 04/26/18 0857    Subjective  Patient reports a little pain this morning behind the knee but nothing now.    Patient Stated Goals  have better motion, less pain, walk better    Currently in Pain?  No/denies                       [facility] Adult PT Treatment/Exercise - 04/26/18 0001      Exercises   Exercises  Knee/Hip      Knee/Hip Exercises: Aerobic   Nustep  L3 x 5 min      Knee/Hip Exercises: Machines for Strengthening   Cybex Knee Extension  too painful with any wt today    Cybex Knee Flexion  15# 1x15, 20# 1x15      Knee/Hip Exercises: Standing   Lateral Step Up  Left;2 sets;10 reps;Step Height: 6"   and 1 x 10 on 6 inch left   Forward Step Up  Left;2 sets;10 reps;Step Height: 4"    Other Standing Knee Exercises  tandem walking x 40 feet    Other Standing  Knee Exercises  SLS bil multiple reps      Knee/Hip Exercises: Seated   Long Arc Quad  Strengthening;Left;2 sets;10 reps      Knee/Hip Exercises: Supine   Straight Leg Raises  Strengthening;Left;1 set;10 reps    Straight Leg Raise with External Rotation  Strengthening;Left;1 set;10 reps      Modalities   Modalities  Vasopneumatic      Vasopneumatic   Number Minutes Vasopneumatic   15 minutes    Vasopnuematic Location   Knee    Vasopneumatic Pressure  Medium    Vasopneumatic Temperature   34      Manual Therapy   Manual Therapy  Joint mobilization;Manual Traction    Joint Mobilization  in supine gentle grade II for flex/ext    Manual Traction  in sitting to left knee               PT Short Term Goals - 02/08/18 0948      PT SHORT TERM GOAL #1  Title  independent with intitial HEP    Status  Achieved        PT Long Term Goals - 04/26/18 1053      PT LONG TERM GOAL #1   Title  understand RICE for home pain and edema relief      PT LONG TERM GOAL #2   Title  increase AROM to 0-120 degree flexion    Period  Weeks    Status  Achieved      PT LONG TERM GOAL #3   Title  decrease pain 50%    Period  Weeks    Status  Achieved      PT LONG TERM GOAL #4   Title  walk all distances without device    Time  8    Period  Weeks    Status  Partially Met            Plan - 04/26/18 1054    Clinical Impression Statement  Patient presents today after 2 week absence from PT. She reports no pain upon arrival, but she had significant pain with weighted knee extension and with step ups to 6 inch; both she had done previously with no pain. She did well with balance activities but is still limited with SLS bil and demostrated occassional LOB in tandem walking with LLE in back.        Patient will benefit from skilled therapeutic intervention in order to improve the following deficits and impairments:     Visit Diagnosis: Stiffness of left knee, not elsewhere  classified  Difficulty in walking, not elsewhere classified  Localized edema  Acute pain of left knee     Problem List Patient Active Problem List   Diagnosis Date Noted  . FHx: dementia 07/28/2017  . Benign essential tremor 07/11/2015  . Hyperlipidemia 07/11/2015  . Back pain 07/10/2015  . Enteritis due to Clostridium difficile 11/11/2014  . Nausea and vomiting 11/10/2014  . History of diverticulitis 11/09/2014    Madelyn Flavors PT 04/26/2018, 11:05 AM  [address] [place], [place], [address] Phone: [phone]   Fax:  [phone]  Name: Jodi Medina MRN: [id number] Date of Birth: 1941/01/08

## 2018-05-04 ENCOUNTER — Ambulatory Visit: Payer: Medicare Other | Admitting: Physical Therapy

## 2018-05-04 DIAGNOSIS — R262 Difficulty in walking, not elsewhere classified: Secondary | ICD-10-CM

## 2018-05-04 DIAGNOSIS — M25662 Stiffness of left knee, not elsewhere classified: Secondary | ICD-10-CM | POA: Diagnosis not present

## 2018-05-04 NOTE — Therapy (Signed)
[place], [place], [address] Phone: [phone]   Fax:  [phone]  Physical Therapy Treatment  Patient Details  Name: Jodi Medina MRN: [id number] Date of Birth: May 11, 1941 Referring Provider (PT): Caffery   Encounter Date: 05/04/2018  PT End of Session - 05/04/18 0954    Visit Number  12    Date for PT Re-Evaluation  05/05/18    PT Start Time  0930    PT Stop Time  1010    PT Time Calculation (min)  40 min       Past Medical History:  Diagnosis Date  . Diverticulitis   . High cholesterol   . High triglycerides   . Lactose intolerance   . Skin cancer   . Tremor     Past Surgical History:  Procedure Laterality Date  . BREAST EXCISIONAL BIOPSY Left 1990  . DENTAL SURGERY  2017  . 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 skin cancer removal  1990s  . TONSILLECTOMY     as a child  . WISDOM TOOTH EXTRACTION      There were no vitals filed for this visit.  Subjective Assessment - 05/04/18 0951    Subjective  I think I am about done. pain still comes and goes ( catch) esp with turns and steps ( ext)    Currently in Pain?  No/denies         [facility] PT Assessment - 05/04/18 0001      AROM   AROM Assessment Site  Knee    Right/Left Knee  Left    Left Knee Extension  0    Left Knee Flexion  120      Strength   Overall Strength Comments  4+/5                   [facility] Adult PT Treatment/Exercise - 05/04/18 0001      Exercises   Exercises  Knee/Hip      Knee/Hip Exercises: Aerobic   Recumbent Bike  6 min full rev    Nustep  L 5 7 min      Knee/Hip Exercises: Standing   Hip Flexion  Stengthening;Left;2 sets;10 reps;Knee bent   3#   Hip Abduction  Stengthening;Left;2 sets;10 reps;Knee straight   3#   Other Standing Knee Exercises  HS curl 3# 2 sets 10      Knee/Hip Exercises: Seated   Long Arc Quad  Strengthening;Left;2 sets;5 reps;Weights    3# 3 sec TKE HOLD            PT Education - 05/04/18 0954    Education Details  reviewed HEPs    Person(s) Educated  Patient    Methods  Explanation    Comprehension  Verbalized understanding       PT Short Term Goals - 02/08/18 0948      PT SHORT TERM GOAL #1   Title  independent with intitial HEP    Status  Achieved        PT Long Term Goals - 05/04/18 0953      PT LONG TERM GOAL #4   Title  walk all distances without device    Status  Achieved            Plan - 05/04/18 0955    Clinical Impression Statement  all goals met. independant with HEPs. rec if still"catching" after holidays rec MD follow  up    PT Treatment/Interventions  ADLs/Self Care Home Management;Cryotherapy;Electrical Stimulation;Gait training;Stair training;Functional mobility training;Therapeutic activities;Therapeutic exercise;Balance training;Neuromuscular re-education;Manual techniques;Patient/family education    PT Next Visit Plan  D/C       Patient will benefit from skilled therapeutic intervention in order to improve the following deficits and impairments:  Abnormal gait, Decreased range of motion, Difficulty walking, Decreased activity tolerance, Pain, Decreased balance, Decreased mobility, Decreased strength, Increased edema  Visit Diagnosis: Stiffness of left knee, not elsewhere classified  Difficulty in walking, not elsewhere classified     Problem List Patient Active Problem List   Diagnosis Date Noted  . FHx: dementia 07/28/2017  . Benign essential tremor 07/11/2015  . Hyperlipidemia 07/11/2015  . Back pain 07/10/2015  . Enteritis due to Clostridium difficile 11/11/2014  . Nausea and vomiting 11/10/2014  . History of diverticulitis 11/09/2014   PHYSICAL THERAPY DISCHARGE SUMMARY   Plan: Patient agrees to discharge.  Patient goals were met. Patient is being discharged due to meeting the stated rehab goals.  ?????      PAYSEUR,ANGIE  PTA 05/04/2018, 10:03  AM  [place] [place], [place], [address] Phone: [phone]   Fax:  [phone]  Name: Jodi Medina MRN: [id number] Date of Birth: 27-Dec-1940

## 2018-05-25 HISTORY — PX: INJECTION KNEE: SHX2446

## 2018-09-22 ENCOUNTER — Other Ambulatory Visit: Payer: Self-pay | Admitting: Neurology

## 2018-09-22 ENCOUNTER — Telehealth: Payer: Self-pay | Admitting: *Deleted

## 2018-09-22 NOTE — Telephone Encounter (Signed)
Received refill request for Propranolol. Spoke with pt and advised an appointment would be needed for further refills however a temporary refill has been sent. Pt verbalized understanding. She has some concerns/questions about her options which she will address at her appointment. She requested a late May appt. Pt scheduled for Wed 10/12/2018 @ 2:00 pm. Pt consented to a virtual visit on [email or web address] and to file visit with insurance. Pt understands this is being offered d/t the current covid19 pandemic. Pt will be using a computer with a camera. Her email address is [email or web address]. She understands the link to the waiting room will be sent to her email.  Reviewed tips with pt for successful virtual visit. She will be using chrome. Pt advised she will receive a call from a nurse within a few days prior to her appt. She verbalized appreciation.

## 2018-10-11 ENCOUNTER — Encounter: Payer: Self-pay | Admitting: *Deleted

## 2018-10-11 NOTE — Telephone Encounter (Signed)
Spoke with pt today. Confirmed pt using 2 identifiers. I updated pt's chart including PMH, surgeries, medications, weight, social history. She understands the office will call her tomorrow at 1:30 to check in and then advised her to join the virtual waiting room at 1:50-1:55 for 2:00 appt. Pt verbalized understanding and appreciation.

## 2018-10-12 ENCOUNTER — Ambulatory Visit (INDEPENDENT_AMBULATORY_CARE_PROVIDER_SITE_OTHER): Payer: Medicare Other | Admitting: Neurology

## 2018-10-12 ENCOUNTER — Other Ambulatory Visit: Payer: Self-pay

## 2018-10-12 DIAGNOSIS — G250 Essential tremor: Secondary | ICD-10-CM | POA: Diagnosis not present

## 2018-10-12 MED ORDER — GABAPENTIN 100 MG PO CAPS
100.0000 mg | ORAL_CAPSULE | Freq: Three times a day (TID) | ORAL | 6 refills | Status: DC
Start: 2018-10-12 — End: 2019-07-09

## 2018-10-12 NOTE — Progress Notes (Signed)
[facility]    Provider:  Dr Jaynee Eagles Referring Provider: Lanice Shirts, * Primary Care Physician:  Lanice Shirts, MD  CC:  Benign essential tremor.   Virtual Visit via Video Note  I connected with Jodi Medina on 10/12/18 at  2:00 PM EDT by a telephone enabled telemedicine application and verified that I am speaking with the correct person using two identifiers.  Location: Patient: home Provider: office   I discussed the limitations of evaluation and management by telemedicine and the availability of in person appointments. The patient expressed understanding and agreed to proceed.  Follow Up Instructions:    I discussed the assessment and treatment plan with the patient. The patient was provided an opportunity to ask questions and all were answered. The patient agreed with the plan and demonstrated an understanding of the instructions.   The patient was advised to call back or seek an in-person evaluation if the symptoms worsen or if the condition fails to improve as anticipated.  I provided 15 minutes of non-face-to-face time during this encounter.   Jodi Beam, MD   Interval history 10/12/2018:  Here for follow up on essential tremor. Her tremor is a little different. It jerks back and forth sometimes. When using a curling iron she was hitting herself back and forth. With action. Worsening. She was only taking 2 propranolol a day because her blood pressure is on the low side.  She is on 3 now and it has helped. No side effects. Discussed we could try long acting at night but if she becomes light headed she can stop and we can try gabapentin. Discussed side effects of Gabapentin. She is very hesitant, tried to discuss every side effects, answered all their questions.  Can use Gabapentin and propranolol as needed.   HPI:  Jodi Medina is a 78 y.o. female here as a referral from Dr. Coralyn Mark for benign essential tremor. PMHx HLD, benign  essential tremor, osteopenia. She had a mechanical fall recently and hit her head. It was a hard hit, no headaches or symptoms of concussion. She has had tremors since mid 68s, fhx in mother and her side of the family. Her brother has parkinson's Disease. She was put on propranolol, she takes it daily and it is less effective. She is only on 5mg . Tremor interferes with eating and drinking, trouble writing and eating soup, she braces her arms when she pus on her eye makeup. Writing is terrible. Slowly progressive. Started with the right hand and then progressed to the left hand. Mother had dementia in her 19s. No new symptoms, no difficulty walking, no other falls, no imbalance, dysphagia. Father had Alzheimer's in his 49s. Sheis worried about Alzheimer's but denies any memory loss at this time, discussed dementia with patient. No other focal neurologic deficits, associated symptoms, inciting events or modifiable factors.  Reviewed notes, labs and imaging from outside physicians, which showed:  Review of Systems: Patient complains of symptoms per HPI as well as the following symptoms: tremor. Pertinent negatives and positives per HPI. All others negative.   Social History   Socioeconomic History  . Marital status: Married    Spouse name: Not on file  . Number of children: 3  . Years of education: Not on file  . Highest education level: Master's degree (e.g., MA, MS, MEng, MEd, MSW, MBA)  Occupational History  . Not on file  Social Needs  . Financial resource strain: Not on file  . Food insecurity:  Worry: Not on file    Inability: Not on file  . Transportation needs:    Medical: Not on file    Non-medical: Not on file  Tobacco Use  . Smoking status: Never Smoker  . Smokeless tobacco: Never Used  Substance and Sexual Activity  . Alcohol use: Yes    Comment: rarely, maybe 1 per month  . Drug use: No  . Sexual activity: Not on file  Lifestyle  . Physical activity:    Days per week:  Not on file    Minutes per session: Not on file  . Stress: Not on file  Relationships  . Social connections:    Talks on phone: Not on file    Gets together: Not on file    Attends religious service: Not on file    Active member of club or organization: Not on file    Attends meetings of clubs or organizations: Not on file    Relationship status: Not on file  . Intimate partner violence:    Fear of current or ex partner: Not on file    Emotionally abused: Not on file    Physically abused: Not on file    Forced sexual activity: Not on file  Other Topics Concern  . Not on file  Social History Narrative   Married, lives at home with husband   Daughter in [place]- 1 grandson   Daughter in [place]- no children   Son- IT works on [employer]il [employer], travel in an [place], lives in [place]   Retired school Associate Professor.      Right handed   Caffeine: 1 cup decaf coffee or herbal tea in the mornings; 2 cups daily in the winter of decaf.    Family History  Problem Relation Age of Onset  . Hyperlipidemia Mother   . Tremor Mother   . Osteoporosis Mother   . Dementia Mother   . Heart failure Mother   . Alzheimer's disease Father   . Cancer Maternal Aunt        breast  . Stroke Maternal Grandmother   . Heart disease Maternal Uncle   . Heart disease Paternal Uncle     Past Medical History:  Diagnosis Date  . Diverticulitis   . High cholesterol   . High triglycerides   . Lactose intolerance   . Skin cancer   . Torn meniscus    bilateral  . Tremor     Past Surgical History:  Procedure Laterality Date  . BREAST EXCISIONAL BIOPSY Left 1990  . DENTAL SURGERY  2017  . INJECTION KNEE Right 2020   "gel"  . microscopic knee surgery Right 12/2017   for torn meniscus  . 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 skin cancer removal  1990s  . TONSILLECTOMY     as a child  . WISDOM TOOTH EXTRACTION      Current Outpatient Medications  Medication Sig  Dispense Refill  . aspirin EC 81 MG tablet Take 81 mg by mouth at bedtime.    Marland Kitchen atorvastatin (LIPITOR) 10 MG tablet Take 5 mg by mouth daily.     . B Complex-Folic Acid (B COMPLEX-VITAMIN B12 PO) Take 1 tablet by mouth daily.    Marland Kitchen CALCIUM CITRATE PO Take 1 tablet by mouth daily. Has Vitamin D 250 units  Calcium 315 mg    . Cholecalciferol (VITAMIN D3 PO) Take 125 mcg by mouth daily.     Marland Kitchen  fenofibrate (TRICOR) 145 MG tablet Take 145 mg by mouth daily.  4  . gabapentin (NEURONTIN) 100 MG capsule Take 1 capsule (100 mg total) by mouth 3 (three) times daily. 90 capsule 6  . Probiotic Product (PROBIOTIC PO) Take 1 each by mouth daily. High potency. 112.5 billion bacteria per capsule.    . propranolol (INDERAL) 10 MG tablet Take 1 tablet (10 mg total) by mouth 3 (three) times daily as needed (tremor). Appointment needed for further refills. 90 tablet 0  . Wheat Dextrin (BENEFIBER) POWD Take by mouth daily.     No current facility-administered medications for this visit.     Allergies as of 10/12/2018 - Review Complete 10/11/2018  Allergen Reaction Noted  . Lactose intolerance (gi)  10/11/2018    Vitals: There were no vitals taken for this visit. Last Weight:  Wt Readings from Last 1 Encounters:  10/11/18 156 lb (70.8 kg)   Last Height:   Ht Readings from Last 1 Encounters:  10/11/18 5\' 3"  (1.6 m)   PRIOR EXAM:  Physical exam: Exam: Gen: NAD, conversant, well nourised, obese, well groomed                     CV: RRR, no MRG. No Carotid Bruits. No peripheral edema, warm, nontender Eyes: Conjunctivae clear without exudates or hemorrhage  Neuro: Detailed Neurologic Exam  Speech:    Speech is normal; fluent and spontaneous with normal comprehension.  Cognition:    The patient is oriented to person, place, and time;     recent and remote memory intact;     language fluent;     normal attention, concentration,     fund of knowledge Cranial Nerves:    The pupils are equal, round,  and reactive to light. Attempted fundoscopic exam could not visualize. Visual fields are full to finger confrontation. Extraocular movements are intact. Trigeminal sensation is intact and the muscles of mastication are normal. The face is symmetric. The palate elevates in the midline. Hearing intact. Voice is normal. Shoulder shrug is normal. The tongue has normal motion without fasciculations.   Coordination:    Normal finger to nose and heel to shin. Normal rapid alternating movements.   Gait:    normal  Motor Observation:    No asymmetry, no atrophy, mild postural and action tremor Tone:    Normal muscle tone.    Posture:    Posture is normal. normal erect    Strength:    Strength is V/V in the upper and lower limbs.      Sensation: intact to LT     Reflex Exam:  DTR's:    Deep tendon reflexes in the upper and lower extremities are symmetrical bilaterally.   Toes:    The toes are equivocal bilaterally.   Clonus:    Clonus is absent.      Assessment/Plan:  78 year old with essential tremor  -Essential Tremor:  Continue propranolol, discussed side effects stop for anything concerning. Start Gabapentn. RTC if tremor worsens or changes. TSH normal. - Fhx of dementia, Normal B12, discussed if she feels she has memory loss to RTC  Meds ordered this encounter  Medications  . gabapentin (NEURONTIN) 100 MG capsule    Sig: Take 1 capsule (100 mg total) by mouth 3 (three) times daily.    Dispense:  90 capsule    Refill:  Felt, MD  [facility] [address] [place], [place]  [address]  Phone [phone] Fax [phone]

## 2018-11-28 ENCOUNTER — Other Ambulatory Visit: Payer: Self-pay | Admitting: Neurology

## 2019-03-07 ENCOUNTER — Telehealth: Payer: Self-pay | Admitting: Neurology

## 2019-03-07 NOTE — Telephone Encounter (Signed)
Spoke with pt. She has tried a max of Gabapentin TID but is too sleepy. She is currently taking it 10 AM & 10 PM, tolerating. She takes Propranolol TID (7-8 AM then before lunch and before dinner). During the call she stated at different times that her hand is a little better but then also said her tremor is not better. She still says she can't do much with right hand. She needs help stirring pots, can't put on mascara, can't carry a cup around. She wants to make sure her medications are being taken correctly. I advised her to keep as is for now if tolerating and I would call back after Dr. Cathren Laine response. She is ok with either med changes or appt, whatever is recommended.

## 2019-03-07 NOTE — Telephone Encounter (Signed)
Pt called wanting to speak to the RN or provider about how to take her propranolol (INDERAL) 10 MG tablet and her gabapentin (NEURONTIN) 100 MG capsule Please advise.

## 2019-03-07 NOTE — Telephone Encounter (Signed)
We cannot go up on her propranolol because of her blood pressure. She cannot even tolerate 100mg  of gabapentin so we can't increase that. If she has never tried primidone I would start her on that 25mg  at bedtime and then increase to 50mg  and then from there. Tell her she should have another appointment to discuss side effects. Alternatively we could refer her to [facility] they have a great team who manages tremors and they may have other ways to treat her instead of medication because she just is very intolerant (and the meds for essential tremor are difficult to take). Is amy still seeing vituals? I don't want to start primidone without talking to her and telling her the side effects. thanks

## 2019-03-08 ENCOUNTER — Other Ambulatory Visit: Payer: Self-pay | Admitting: Neurology

## 2019-03-08 DIAGNOSIS — R251 Tremor, unspecified: Secondary | ICD-10-CM

## 2019-03-08 NOTE — Telephone Encounter (Signed)
I spoke with the patient and discussed Dr. Cathren Laine message. The pt understands her options to come for a visit and discuss primidone or we can refer to [facility] specialists to discuss other options for tremor that may not include medications. The patient chose the referral to [facility]. She stated earlier this year she took anti-inflammatory medications when she had surgery for torn meniscus and got an inflamed colon and wanted to discuss those things that happened prior to her last televisit with Dr. Jaynee Eagles and why she needed increased medication for tremors (medication addressed during May appt with Dr. Jaynee Eagles). Patient was advised she would need an appt to discuss with provider. She verbalized understanding. Patient understands she will get a call from [facility] to schedule an appt.   Please order [facility] referral.

## 2019-03-09 ENCOUNTER — Telehealth: Payer: Self-pay | Admitting: Neurology

## 2019-03-09 NOTE — Telephone Encounter (Addendum)
Dr. Jaynee Eagles Please advise [facility] has two movement specialist and they are booking into 2021 [facility], [place] any ideas  ?

## 2019-03-09 NOTE — Telephone Encounter (Signed)
That's fine she can wait this is not urgent. thanks

## 2019-03-13 NOTE — Telephone Encounter (Signed)
Dr. Cloyde Reams patient's apt  Telephone [phone] - fax [phone] . Patient is scheduled August 18 th 2021 at 8:00 am

## 2019-04-10 ENCOUNTER — Telehealth: Payer: Self-pay | Admitting: Neurology

## 2019-04-10 NOTE — Telephone Encounter (Signed)
Pt is asking for a call to discuss how she is to take these two medications propranolol (INDERAL) 10 MG tablet and  gabapentin (NEURONTIN) 100 MG capsule

## 2019-04-12 NOTE — Telephone Encounter (Addendum)
I called the pt. She asked if she could take the propranolol and the gabapentin at the same time in the morning. Pt informed this is ok. She denied any issues with propranolol making her tired. She plans to take it 30 minutes before meals and the gabapentin with breakfast and before bed. She stated she was not aware that her appt in Aug 2021 was with Dr. Linus Mako. She stated she thought that was with our office. She is going to call [facility] to see if she can be seen earlier. She also scheduled a f/u with Amy NP on Mon 04/17/2019 @ 3 pm and will cancel if not needed. Pt worried there is something wrong that is causing the worsened tremors. Encouraged pt to call by tomorrow if not needed to avoid any no shows. She verbalized understanding and appreciation.

## 2019-04-17 ENCOUNTER — Ambulatory Visit: Payer: Medicare Other | Admitting: Family Medicine

## 2019-04-17 ENCOUNTER — Encounter: Payer: Self-pay | Admitting: Family Medicine

## 2019-04-17 ENCOUNTER — Other Ambulatory Visit: Payer: Self-pay

## 2019-04-17 VITALS — BP 139/80 | HR 64 | Temp 97.1°F | Ht 63.0 in | Wt 165.6 lb

## 2019-04-17 DIAGNOSIS — G250 Essential tremor: Secondary | ICD-10-CM | POA: Diagnosis not present

## 2019-04-17 NOTE — Patient Instructions (Addendum)
Continue propranolol and gabapentin as needed for tremor, we will consider primidone in future if you wish   Follow up in 1 year, sooner if needed    Essential Tremor A tremor is trembling or shaking that a person cannot control. Most tremors affect the hands or arms. Tremors can also affect the head, vocal cords, legs, and other parts of the body. Essential tremor is a tremor without a known cause. Usually, it occurs while a person is trying to perform an action. It tends to get worse gradually as a person ages. What are the causes? The cause of this condition is not known. What increases the risk? You are more likely to develop this condition if:  You have a family member with essential tremor.  You are age 75 or older.  You take certain medicines. What are the signs or symptoms? The main sign of a tremor is a rhythmic shaking of certain parts of your body that is uncontrolled and unintentional. You may:  Have difficulty eating with a spoon or fork.  Have difficulty writing.  Nod your head up and down or side to side.  Have a quivering voice. The shaking may:  Get worse over time.  Come and go.  Be more noticeable on one side of your body.  Get worse due to stress, fatigue, caffeine, and extreme heat or cold. How is this diagnosed? This condition may be diagnosed based on:  Your symptoms and medical history.  A physical exam. There is no single test to diagnose an essential tremor. However, your health care provider may order tests to rule out other causes of your condition. These may include:  Blood and urine tests.  Imaging studies of your brain, such as CT scan and MRI.  A test that measures involuntary muscle movement (electromyogram). How is this treated? Treatment for essential tremor depends on the severity of the condition.  Some tremors may go away without treatment.  Mild tremors may not need treatment if they do not affect your day-to-day life.   Severe tremors may need to be treated using one or more of the following options: ? Medicines. ? Lifestyle changes. ? Occupational or physical therapy. Follow these instructions at home: Lifestyle   Do not use any products that contain nicotine or tobacco, such as cigarettes and e-cigarettes. If you need help quitting, ask your health care provider.  Limit your caffeine intake as told by your health care provider.  Try to get 8 hours of sleep each night.  Find ways to manage your stress that fits your lifestyle and personality. Consider trying meditation or yoga.  Try to anticipate stressful situations and allow extra time to manage them.  If you are struggling emotionally with the effects of your tremor, consider working with a mental health provider. General instructions  Take over-the-counter and prescription medicines only as told by your health care provider.  Avoid extreme heat and extreme cold.  Keep all follow-up visits as told by your health care provider. This is important. Visits may include physical therapy visits. Contact a health care provider if:  You experience any changes in the location or intensity of your tremors.  You start having a tremor after starting a new medicine.  You have tremor with other symptoms, such as: ? Numbness. ? Tingling. ? Pain. ? Weakness.  Your tremor gets worse.  Your tremor interferes with your daily life.  You feel down, blue, or sad for at least 2 weeks in a row.  Worrying about your tremor and what other people think about you interferes with your everyday life functions, including relationships, work, or school. Summary  Essential tremor is a tremor without a known cause. Usually, it occurs when you are trying to perform an action.  The cause of this condition is not known.  The main sign of a tremor is a rhythmic shaking of certain parts of your body that is uncontrolled and unintentional.  Treatment for essential  tremor depends on the severity of the condition. This information is not intended to replace advice given to you by your health care provider. Make sure you discuss any questions you have with your health care provider. Document Released: 06/01/2014 Document Revised: 05/21/2017 Document Reviewed: 05/21/2017 Elsevier Patient Education  [place].    Primidone tablets What is this medicine? PRIMIDONE (PRI mi done) is a barbiturate. This medicine is used to control seizures in certain types of epilepsy. It is not for use in absence (petit mal) seizures. This medicine may be used for other purposes; ask your health care provider or pharmacist if you have questions. COMMON BRAND NAME(S): Mysoline What should I tell my health care provider before I take this medicine? They need to know if you have any of these conditions:  kidney disease  liver disease  porphyria  suicidal thoughts, plans, or attempt; a previous suicide attempt by you or a family member  an unusual or allergic reaction to primidone, phenobarbital, other barbiturates or seizure medications, other medicines, foods, dyes, or preservatives  pregnant or trying to get pregnant  breast-feeding How should I use this medicine? Take this medicine by mouth with a glass of water. Follow the directions on the prescription label. Take your doses at regular intervals. Do not take your medicine more often than directed. Do not stop taking except on the advice of your doctor or health care professional. A special MedGuide will be given to you by the pharmacist with each prescription and refill. Be sure to read this information carefully each time. Contact your pediatrician or health care professional regarding the use of this medication in children. Special care may be needed. While this drug may be prescribed for children for selected conditions, precautions do apply. Overdosage: If you think you have taken too much of this  medicine contact a poison control center or emergency room at once. NOTE: This medicine is only for you. Do not share this medicine with others. What if I miss a dose? If you miss a dose, take it as soon as you can. If it is almost time for your next dose, take only that dose. Do not take double or extra doses. What may interact with this medicine? Do not take this medicine with any of the following medications:  voriconazole This medicine may also interact with the following medications:  cancer-treating medications  cyclosporine  disopyramide  doxycycline  female hormones, including contraceptive or birth control pills  medicines for mental depression, anxiety or other mood problems  medicines for treating HIV infection or AIDS  modafinil  prescription pain medications  quinidine  warfarin This list may not describe all possible interactions. Give your health care provider a list of all the medicines, herbs, non-prescription drugs, or dietary supplements you use. Also tell them if you smoke, drink alcohol, or use illegal drugs. Some items may interact with your medicine. What should I watch for while using this medicine? Visit your doctor or health care professional for regular checks on your progress.  It may be 2 to 3 weeks before you see the full effects of this medicine. Do not suddenly stop taking this medicine, you may increase the risk of seizures. Your doctor or health care professional may want to gradually reduce the dose. Wear a medical identification bracelet or chain to say you have epilepsy, and carry a card that lists all your medications. You may get drowsy or dizzy. Do not drive, use machinery, or do anything that needs mental alertness until you know how this medicine affects you. Do not stand or sit up quickly, especially if you are an older patient. This reduces the risk of dizzy or fainting spells. Alcohol may interfere with the effect of this medicine. Avoid  alcoholic drinks. Birth control pills may not work properly while you are taking this medicine. Talk to your doctor about using an extra method of birth control. The use of this medicine may increase the chance of suicidal thoughts or actions. Pay special attention to how you are responding while on this medicine. Any worsening of mood, or thoughts of suicide or dying should be reported to your health care professional right away. Women who become pregnant while using this medicine may enroll in the [place] Pregnancy Registry by calling [phone]. This registry collects information about the safety of antiepileptic drug use during pregnancy. This medicine may cause a decrease in vitamin D and folic acid. You should make sure that you get enough vitamins while you are taking this medicine. Discuss the foods you eat and the vitamins you take with your health care professional. What side effects may I notice from receiving this medicine? Side effects that you should report to your doctor or health care professional as soon as possible:  allergic reactions like skin rash, itching or hives, swelling of the face, lips, or tongue  blurred, double vision, or uncontrollable rolling or movements of the eyes  redness, blistering, peeling or loosening of the skin, including inside the mouth  shortness of breath or difficulty breathing  unusual excitement or restlessness, more likely in children and the elderly  unusually weak or tired  worsening of mood, thoughts or actions of suicide or dying Side effects that usually do not require medical attention (report to your doctor or health care professional if they continue or are bothersome):  clumsiness, unsteadiness, or a hang-over effect  decreased sexual ability  dizziness, drowsiness  loss of appetite  nausea or vomiting This list may not describe all possible side effects. Call your doctor for medical advice about  side effects. You may report side effects to FDA at 1-800-FDA-1088. Where should I keep my medicine? Keep out of the reach of children. This medicine may cause accidental overdose and death if it taken by other adults, children, or pets. Mix any unused medicine with a substance like cat litter or coffee grounds. Then throw the medicine away in a sealed container like a sealed bag or a coffee can with a lid. Do not use the medicine after the expiration date. Store at room temperature between 15 and 30 degrees C (59 and 86 degrees F). NOTE: This sheet is a summary. It may not cover all possible information. If you have questions about this medicine, talk to your doctor, pharmacist, or health care provider.  2020 Elsevier/Gold Standard (2016-12-23 15:21:47)

## 2019-04-17 NOTE — Progress Notes (Addendum)
PATIENT: Jodi Medina DOB: [birth year]  REASON FOR VISIT: follow up HISTORY FROM: patient  Chief Complaint  Patient presents with  . Follow-up    Tremor f/u. Alone. Rm 2. Patient mentioned that she would like to discuss how she is not able to use her right hand as much. She would like to know if it has anything to do with the fall she had a couple years ago.      HISTORY OF PRESENT ILLNESS: Today 04/17/19 Jodi Medina is a 78 y.o. female here today for follow up of essential tremor. She continues propranolol and gabapentin as needed for management. She is taking propranolol 10mg  three times daily. She has increased gabapentin 100mg  to twice daily. She does feel that tremor is better. She feels that left hand tremor is not bothersome. Her right hand is worse but has improved somewhat. She continues to have a significant tremor but reports that the "jerking" is not as prominent. She has tried to increase gabapentin to 100mg  three times daily but felt that this made her too sleepy.  She was referred to [facility] for consideration of nonpharmacological treatment options.  She does have an appointment in August 2021.  HISTORY: (copied from Dr Cathren Laine note on 10/12/2018)  Interval history 10/12/2018:  Here for follow up on essential tremor. Her tremor is a little different. It jerks back and forth sometimes. When using a curling iron she was hitting herself back and forth. With action. Worsening. She was only taking 2 propranolol a day because her blood pressure is on the low side.  She is on 3 now and it has helped. No side effects. Discussed we could try long acting at night but if she becomes light headed she can stop and we can try gabapentin. Discussed side effects of Gabapentin. She is very hesitant, tried to discuss every side effects, answered all their questions.  Can use Gabapentin and propranolol as needed.   HPI:  Jodi Medina is a 78 y.o. female here as a referral from Dr.  Coralyn Mark for benign essential tremor. PMHx HLD, benign essential tremor, osteopenia. She had a mechanical fall recently and hit her head. It was a hard hit, no headaches or symptoms of concussion. She has had tremors since mid 46s, fhx in mother and her side of the family. Her brother has parkinson's Disease. She was put on propranolol, she takes it daily and it is less effective. She is only on 5mg . Tremor interferes with eating and drinking, trouble writing and eating soup, she braces her arms when she pus on her eye makeup. Writing is terrible. Slowly progressive. Started with the right hand and then progressed to the left hand. Mother had dementia in her 81s. No new symptoms, no difficulty walking, no other falls, no imbalance, dysphagia. Father had Alzheimer's in his 16s. Sheis worried about Alzheimer's but denies any memory loss at this time, discussed dementia with patient. No other focal neurologic deficits, associated symptoms, inciting events or modifiable factors.   REVIEW OF SYSTEMS: Out of a complete 14 system review of symptoms, the patient complains only of the following symptoms, tremor and all other reviewed systems are negative.  ALLERGIES: Allergies  Allergen Reactions  . Lactose Intolerance (Gi)     HOME MEDICATIONS: Outpatient Medications Prior to Visit  Medication Sig Dispense Refill  . atorvastatin (LIPITOR) 10 MG tablet Take 5 mg by mouth daily.     . B Complex-Folic Acid (B COMPLEX-VITAMIN B12 PO)  Take 1 tablet by mouth daily.    Marland Kitchen CALCIUM CITRATE PO Take 1 tablet by mouth daily. Has Vitamin D 250 units  Calcium 315 mg    . Cholecalciferol (VITAMIN D3 PO) Take 125 mcg by mouth daily.     Marland Kitchen gabapentin (NEURONTIN) 100 MG capsule Take 1 capsule (100 mg total) by mouth 3 (three) times daily. 90 capsule 6  . Probiotic Product (ALIGN PO) Take 1 tablet by mouth daily.    . propranolol (INDERAL) 10 MG tablet Take 1 tablet (10 mg total) by mouth 3 (three) times daily as needed.  270 tablet 4  . Wheat Dextrin (BENEFIBER) POWD Take by mouth daily.    . Probiotic Product (PROBIOTIC PO) Take 1 each by mouth daily. High potency. 112.5 billion bacteria per capsule.    Marland Kitchen aspirin EC 81 MG tablet Take 81 mg by mouth at bedtime.    . fenofibrate (TRICOR) 145 MG tablet Take 145 mg by mouth daily.  4   No facility-administered medications prior to visit.     PAST MEDICAL HISTORY: Past Medical History:  Diagnosis Date  . Diverticulitis   . High cholesterol   . High triglycerides   . Lactose intolerance   . Skin cancer   . Torn meniscus    bilateral  . Tremor     PAST SURGICAL HISTORY: Past Surgical History:  Procedure Laterality Date  . BREAST EXCISIONAL BIOPSY Left 1990  . DENTAL SURGERY  2017  . INJECTION KNEE Right 2020   "gel"  . microscopic knee surgery Right 12/2017   for torn meniscus  . 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 skin cancer removal  1990s  . TONSILLECTOMY     as a child  . WISDOM TOOTH EXTRACTION      FAMILY HISTORY: Family History  Problem Relation Age of Onset  . Hyperlipidemia Mother   . Tremor Mother   . Osteoporosis Mother   . Dementia Mother   . Heart failure Mother   . Alzheimer's disease Father   . Cancer Maternal Aunt        breast  . Stroke Maternal Grandmother   . Heart disease Maternal Uncle   . Heart disease Paternal Uncle     SOCIAL HISTORY: Social History   Socioeconomic History  . Marital status: Married    Spouse name: Not on file  . Number of children: 3  . Years of education: Not on file  . Highest education level: Master's degree (e.g., MA, MS, MEng, MEd, MSW, MBA)  Occupational History  . Not on file  Social Needs  . Financial resource strain: Not on file  . Food insecurity    Worry: Not on file    Inability: Not on file  . Transportation needs    Medical: Not on file    Non-medical: Not on file  Tobacco Use  . Smoking status: Never Smoker  . Smokeless tobacco: Never Used   Substance and Sexual Activity  . Alcohol use: Yes    Comment: rarely, maybe 1 per month  . Drug use: No  . Sexual activity: Not on file  Lifestyle  . Physical activity    Days per week: Not on file    Minutes per session: Not on file  . Stress: Not on file  Relationships  . Social Herbalist on phone: Not on file    Gets together: Not on file    Attends religious service:  Not on file    Active member of club or organization: Not on file    Attends meetings of clubs or organizations: Not on file    Relationship status: Not on file  . Intimate partner violence    Fear of current or ex partner: Not on file    Emotionally abused: Not on file    Physically abused: Not on file    Forced sexual activity: Not on file  Other Topics Concern  . Not on file  Social History Narrative   Married, lives at home with husband   Daughter in [place]- 1 grandson   Daughter in [place]- no children   Son- IT works on Soil [employer], travel in an [place], lives in [place]   Retired school Associate Professor.      Right handed   Caffeine: 1 cup decaf coffee or herbal tea in the mornings; 2 cups daily in the winter of decaf.      PHYSICAL EXAM  Vitals:   04/17/19 1355  BP: 139/80  Pulse: 64  Temp: (!) 97.1 F (36.2 C)  TempSrc: Oral  Weight: 165 lb 9.6 oz (75.1 kg)  Height: 5\' 3"  (1.6 m)   Body mass index is 29.33 kg/m.  Generalized: Well developed, in no acute distress  Cardiology: normal rate and rhythm, no murmur noted Neurological examination  Mentation: Alert oriented to time, place, history taking. Follows all commands speech and language fluent Cranial nerve II-XII: Pupils were equal round reactive to light. Extraocular movements were full, visual field were full on confrontational test. Facial sensation and strength were normal. Uvula tongue midline. Head turning and shoulder shrug  were normal and symmetric. Motor: The motor testing reveals 5 over 5  strength of all 4 extremities. Good symmetric motor tone is noted throughout. Tremor noted with activity of bilateral hands R>L Sensory: Sensory testing is intact to soft touch on all 4 extremities. No evidence of extinction is noted.  Coordination: Cerebellar testing reveals good finger-nose-finger and heel-to-shin bilaterally.  Gait and station: Gait is normal.   DIAGNOSTIC DATA (LABS, IMAGING, TESTING) - I reviewed patient records, labs, notes, testing and imaging myself where available.  No flowsheet data found.   Lab Results  Component Value Date   WBC 4.9 11/09/2014   HGB 14.7 11/09/2014   HCT 42.8 11/09/2014   MCV 87.0 11/09/2014   PLT 190 11/09/2014      Component Value Date/Time   NA 138 11/09/2014 2049   K 4.1 11/09/2014 2049   CL 102 11/09/2014 2049   CO2 25 11/09/2014 2049   GLUCOSE 91 11/09/2014 2049   BUN 6 11/09/2014 2049   CREATININE 0.81 11/09/2014 2049   CALCIUM 9.8 11/09/2014 2049   PROT 7.2 11/09/2014 2049   ALBUMIN 4.4 11/09/2014 2049   AST 28 11/09/2014 2049   ALT 17 11/09/2014 2049   ALKPHOS 50 11/09/2014 2049   BILITOT 0.9 11/09/2014 2049   GFRNONAA >60 11/09/2014 2049   GFRAA >60 11/09/2014 2049   No results found for: CHOL, HDL, LDLCALC, LDLDIRECT, TRIG, CHOLHDL No results found for: HGBA1C Lab Results  Component Value Date   VITAMINB12 526 07/28/2017   Lab Results  Component Value Date   TSH 3.070 07/28/2017       ASSESSMENT AND PLAN 78 y.o. year old female  has a past medical history of Diverticulitis, High cholesterol, High triglycerides, Lactose intolerance, Skin cancer, Torn meniscus, and Tremor. here with     ICD-10-CM  1. Benign essential tremor  G25.0     Beverlee Nims continues to suffer with an essential tremor.  Right hand is worse than the left.  She does feel that there has been some improvement in tremor since adding gabapentin.  She is now taking propranolol 10 mg 3 times daily and gabapentin 100 mg 2 times daily.  She is  attempted to increase gabapentin to 3 times daily but feels that this makes her too sleepy.  We will continue current therapy.  We have discussed adding primidone for treatment of tremor.  We will wean gabapentin then start primidone at 50 mg daily.  We could titrate up pending her response.  She is hesitant to change her therapy today.  I have provided her with additional information regarding essential tremor and primidone including side effects in her AVS.  She will continue to pursue appointment with [facility] for consideration of alternative therapies.  We have discussed in great detail the etiology with essential tremor.  She is mostly concerned with not being able to cure this process.  She understands treatment options and will follow up in 1 year, sooner if needed.  She verbalizes understanding and agreement with this plan.   No orders of the defined types were placed in this encounter.    No orders of the defined types were placed in this encounter.     I spent 25 minutes with the patient. 50% of this time was spent counseling and educating patient on plan of care and medications.    Debbora Presto, FNP-C 04/17/2019, 2:18 PM [facility] [address], [place], [place] [address] [phone]   Made any corrections needed, and agree with history, physical, neuro exam,assessment and plan as stated.     Sarina Ill, MD [facility]

## 2019-05-08 ENCOUNTER — Telehealth: Payer: Self-pay | Admitting: Family Medicine

## 2019-05-08 NOTE — Telephone Encounter (Signed)
Pt states she had an episode last night and is wanting to discuss either coming down or completely off of gabapentin (NEURONTIN) 100 MG capsule.  Please call

## 2019-05-08 NOTE — Telephone Encounter (Signed)
I called pt and relayed per AL/NP her note per below.  Pt verbalized udnerstanding.  She had seen her pcp recently.  I relayed that may be beneficial to check with pcp on her sx that she had.  Will go back to gabapentin 100mg  po bid.  Will reassess in 2 wks.  If sx as SOB, CP jaw apin Tingling numbness arm to seek urgetn care at ED or call 911.  She did verbalize understanding.

## 2019-05-08 NOTE — Telephone Encounter (Signed)
I called pt.  She stated that she has taken the gabapentin 100mg  po tid sat sun.  She stated woke up this am 0230 with hot flash w/o sweating, could not recall time, see her phone, felt brain was processing slowly, tingling back of L arm, teeth hurt like she was clenching.  Her Bp tends to be on lower end.  She will go back to taking BID and questioning if this common SE ?  Change to other medication. (prmidone).  Please advise.

## 2019-05-08 NOTE — Telephone Encounter (Signed)
Please let her know that gabapentin can cause brain fog and slow processing but not typically associated with waking in the middle of the night with hot flash, tightness of jaw and tingling of arm. She needs to decrease dose of gabapentin to 100mg  BID. I would like for her to monitor symptoms closely. She may need to see PCP for any continued concerns or if she has had any trouble breathing, shob, chest pain or pain in jaw. Symptoms she is describing can be concerns of cardiac trouble. If any emergent concerns like trouble breathing, shob, chest pain, jaw pain, tingling/numbness of arm reoccur, she should seek medical attention immediately by calling 911. We will reassess in 2 weeks before starting a new medication. She may call with a progress report in 2 weeks.

## 2019-07-08 ENCOUNTER — Other Ambulatory Visit: Payer: Self-pay | Admitting: Neurology

## 2019-07-08 DIAGNOSIS — G250 Essential tremor: Secondary | ICD-10-CM

## 2019-08-01 ENCOUNTER — Telehealth: Payer: Self-pay | Admitting: Family Medicine

## 2019-08-01 MED ORDER — GABAPENTIN 100 MG PO CAPS: 100.0000 mg | ORAL_CAPSULE | Freq: Three times a day (TID) | ORAL | 0 refills | Status: DC

## 2019-08-01 NOTE — Telephone Encounter (Signed)
Pt called saying that she is out of town and forgot her medications and is wanting to know if her gabapentin (NEURONTIN) 100 MG capsule can be called in for a 10 day supply BID to the [facility] in [place] Pt would like to be called when the prescription has been sent in. Please advise.

## 2019-10-10 ENCOUNTER — Other Ambulatory Visit: Payer: Self-pay | Admitting: Internal Medicine

## 2019-10-10 DIAGNOSIS — M85859 Other specified disorders of bone density and structure, unspecified thigh: Secondary | ICD-10-CM

## 2019-10-10 DIAGNOSIS — Z1231 Encounter for screening mammogram for malignant neoplasm of breast: Secondary | ICD-10-CM

## 2019-12-22 ENCOUNTER — Ambulatory Visit: Payer: Medicare Other

## 2019-12-22 ENCOUNTER — Other Ambulatory Visit: Payer: Medicare Other

## 2019-12-24 ENCOUNTER — Other Ambulatory Visit: Payer: Self-pay | Admitting: Neurology

## 2020-01-03 DIAGNOSIS — G20 Parkinson's disease: Secondary | ICD-10-CM | POA: Diagnosis not present

## 2020-01-03 DIAGNOSIS — G252 Other specified forms of tremor: Secondary | ICD-10-CM | POA: Diagnosis not present

## 2020-01-10 DIAGNOSIS — R251 Tremor, unspecified: Secondary | ICD-10-CM | POA: Diagnosis not present

## 2020-01-10 DIAGNOSIS — Z79899 Other long term (current) drug therapy: Secondary | ICD-10-CM | POA: Diagnosis not present

## 2020-01-10 DIAGNOSIS — G250 Essential tremor: Secondary | ICD-10-CM | POA: Diagnosis not present

## 2020-01-26 DIAGNOSIS — R251 Tremor, unspecified: Secondary | ICD-10-CM | POA: Diagnosis not present

## 2020-02-12 DIAGNOSIS — E785 Hyperlipidemia, unspecified: Secondary | ICD-10-CM | POA: Diagnosis not present

## 2020-02-12 DIAGNOSIS — R42 Dizziness and giddiness: Secondary | ICD-10-CM | POA: Diagnosis not present

## 2020-02-12 DIAGNOSIS — I6930 Unspecified sequelae of cerebral infarction: Secondary | ICD-10-CM | POA: Diagnosis not present

## 2020-02-12 DIAGNOSIS — R0989 Other specified symptoms and signs involving the circulatory and respiratory systems: Secondary | ICD-10-CM | POA: Diagnosis not present

## 2020-02-12 DIAGNOSIS — M85859 Other specified disorders of bone density and structure, unspecified thigh: Secondary | ICD-10-CM | POA: Diagnosis not present

## 2020-02-12 DIAGNOSIS — G250 Essential tremor: Secondary | ICD-10-CM | POA: Diagnosis not present

## 2020-02-13 DIAGNOSIS — G250 Essential tremor: Secondary | ICD-10-CM | POA: Diagnosis not present

## 2020-02-14 ENCOUNTER — Other Ambulatory Visit: Payer: Self-pay | Admitting: Internal Medicine

## 2020-02-14 DIAGNOSIS — R0989 Other specified symptoms and signs involving the circulatory and respiratory systems: Secondary | ICD-10-CM

## 2020-02-21 ENCOUNTER — Ambulatory Visit
Admission: RE | Admit: 2020-02-21 | Discharge: 2020-02-21 | Disposition: A | Discharge status: Home / Self Care | Payer: Medicare Other | Source: Ambulatory Visit | Attending: Internal Medicine | Admitting: Internal Medicine

## 2020-02-21 DIAGNOSIS — R0989 Other specified symptoms and signs involving the circulatory and respiratory systems: Secondary | ICD-10-CM

## 2020-02-21 DIAGNOSIS — I6523 Occlusion and stenosis of bilateral carotid arteries: Secondary | ICD-10-CM | POA: Diagnosis not present

## 2020-02-22 ENCOUNTER — Ambulatory Visit
Admission: RE | Admit: 2020-02-22 | Discharge: 2020-02-22 | Disposition: A | Discharge status: Home / Self Care | Payer: Medicare Other | Source: Ambulatory Visit | Attending: Internal Medicine | Admitting: Internal Medicine

## 2020-02-22 ENCOUNTER — Other Ambulatory Visit: Payer: Self-pay

## 2020-02-22 DIAGNOSIS — Z780 Asymptomatic menopausal state: Secondary | ICD-10-CM | POA: Diagnosis not present

## 2020-02-22 DIAGNOSIS — Z1231 Encounter for screening mammogram for malignant neoplasm of breast: Secondary | ICD-10-CM

## 2020-02-22 DIAGNOSIS — M85859 Other specified disorders of bone density and structure, unspecified thigh: Secondary | ICD-10-CM

## 2020-03-07 DIAGNOSIS — L814 Other melanin hyperpigmentation: Secondary | ICD-10-CM | POA: Diagnosis not present

## 2020-03-07 DIAGNOSIS — L218 Other seborrheic dermatitis: Secondary | ICD-10-CM | POA: Diagnosis not present

## 2020-03-07 DIAGNOSIS — Z85828 Personal history of other malignant neoplasm of skin: Secondary | ICD-10-CM | POA: Diagnosis not present

## 2020-03-07 DIAGNOSIS — L918 Other hypertrophic disorders of the skin: Secondary | ICD-10-CM | POA: Diagnosis not present

## 2020-03-07 DIAGNOSIS — L821 Other seborrheic keratosis: Secondary | ICD-10-CM | POA: Diagnosis not present

## 2020-03-07 DIAGNOSIS — D1801 Hemangioma of skin and subcutaneous tissue: Secondary | ICD-10-CM | POA: Diagnosis not present

## 2020-04-03 DIAGNOSIS — G250 Essential tremor: Secondary | ICD-10-CM | POA: Diagnosis not present

## 2020-04-15 DIAGNOSIS — G250 Essential tremor: Secondary | ICD-10-CM | POA: Diagnosis not present

## 2020-04-15 DIAGNOSIS — I6930 Unspecified sequelae of cerebral infarction: Secondary | ICD-10-CM | POA: Diagnosis not present

## 2020-04-15 DIAGNOSIS — Z1389 Encounter for screening for other disorder: Secondary | ICD-10-CM | POA: Diagnosis not present

## 2020-04-15 DIAGNOSIS — E559 Vitamin D deficiency, unspecified: Secondary | ICD-10-CM | POA: Diagnosis not present

## 2020-04-15 DIAGNOSIS — Z0000 Encounter for general adult medical examination without abnormal findings: Secondary | ICD-10-CM | POA: Diagnosis not present

## 2020-04-15 DIAGNOSIS — E785 Hyperlipidemia, unspecified: Secondary | ICD-10-CM | POA: Diagnosis not present

## 2020-04-15 DIAGNOSIS — Z79899 Other long term (current) drug therapy: Secondary | ICD-10-CM | POA: Diagnosis not present

## 2020-04-15 DIAGNOSIS — Z8673 Personal history of transient ischemic attack (TIA), and cerebral infarction without residual deficits: Secondary | ICD-10-CM | POA: Diagnosis not present

## 2020-04-15 DIAGNOSIS — C4491 Basal cell carcinoma of skin, unspecified: Secondary | ICD-10-CM | POA: Diagnosis not present

## 2020-04-15 DIAGNOSIS — Z7189 Other specified counseling: Secondary | ICD-10-CM | POA: Diagnosis not present

## 2020-04-15 DIAGNOSIS — Z23 Encounter for immunization: Secondary | ICD-10-CM | POA: Diagnosis not present

## 2020-04-22 ENCOUNTER — Ambulatory Visit: Payer: Medicare Other | Admitting: Family Medicine

## 2020-05-14 DIAGNOSIS — G250 Essential tremor: Secondary | ICD-10-CM | POA: Diagnosis not present

## 2020-05-14 DIAGNOSIS — G20 Parkinson's disease: Secondary | ICD-10-CM | POA: Diagnosis not present

## 2020-05-15 DIAGNOSIS — R41844 Frontal lobe and executive function deficit: Secondary | ICD-10-CM | POA: Diagnosis not present

## 2020-05-15 DIAGNOSIS — R251 Tremor, unspecified: Secondary | ICD-10-CM | POA: Diagnosis not present

## 2020-05-29 DIAGNOSIS — Z79899 Other long term (current) drug therapy: Secondary | ICD-10-CM | POA: Diagnosis not present

## 2020-05-29 DIAGNOSIS — G252 Other specified forms of tremor: Secondary | ICD-10-CM | POA: Diagnosis not present

## 2020-05-29 DIAGNOSIS — G20 Parkinson's disease: Secondary | ICD-10-CM | POA: Diagnosis not present

## 2020-07-04 DIAGNOSIS — G238 Other specified degenerative diseases of basal ganglia: Secondary | ICD-10-CM | POA: Diagnosis not present

## 2020-07-04 DIAGNOSIS — G20 Parkinson's disease: Secondary | ICD-10-CM | POA: Diagnosis not present

## 2020-07-10 DIAGNOSIS — G250 Essential tremor: Secondary | ICD-10-CM | POA: Diagnosis not present

## 2020-07-10 DIAGNOSIS — G20 Parkinson's disease: Secondary | ICD-10-CM | POA: Diagnosis not present

## 2020-07-15 DIAGNOSIS — H2513 Age-related nuclear cataract, bilateral: Secondary | ICD-10-CM | POA: Diagnosis not present

## 2020-07-15 DIAGNOSIS — H524 Presbyopia: Secondary | ICD-10-CM | POA: Diagnosis not present

## 2020-10-01 ENCOUNTER — Other Ambulatory Visit: Payer: Self-pay

## 2020-10-01 ENCOUNTER — Emergency Department (HOSPITAL_BASED_OUTPATIENT_CLINIC_OR_DEPARTMENT_OTHER)
Admission: EM | Admit: 2020-10-01 | Discharge: 2020-10-01 | Disposition: A | Discharge status: Home / Self Care | Payer: Medicare PPO | Attending: Emergency Medicine | Admitting: Emergency Medicine

## 2020-10-01 ENCOUNTER — Encounter (HOSPITAL_BASED_OUTPATIENT_CLINIC_OR_DEPARTMENT_OTHER): Payer: Self-pay

## 2020-10-01 ENCOUNTER — Emergency Department (HOSPITAL_BASED_OUTPATIENT_CLINIC_OR_DEPARTMENT_OTHER): Payer: Medicare PPO

## 2020-10-01 DIAGNOSIS — Z85828 Personal history of other malignant neoplasm of skin: Secondary | ICD-10-CM | POA: Insufficient documentation

## 2020-10-01 DIAGNOSIS — W010XXA Fall on same level from slipping, tripping and stumbling without subsequent striking against object, initial encounter: Secondary | ICD-10-CM | POA: Insufficient documentation

## 2020-10-01 DIAGNOSIS — Z7982 Long term (current) use of aspirin: Secondary | ICD-10-CM | POA: Insufficient documentation

## 2020-10-01 DIAGNOSIS — S82865D Nondisplaced Maisonneuve's fracture of left leg, subsequent encounter for closed fracture with routine healing: Secondary | ICD-10-CM | POA: Diagnosis not present

## 2020-10-01 DIAGNOSIS — S82865A Nondisplaced Maisonneuve's fracture of left leg, initial encounter for closed fracture: Secondary | ICD-10-CM

## 2020-10-01 DIAGNOSIS — S8992XA Unspecified injury of left lower leg, initial encounter: Secondary | ICD-10-CM | POA: Diagnosis present

## 2020-10-01 DIAGNOSIS — M1712 Unilateral primary osteoarthritis, left knee: Secondary | ICD-10-CM | POA: Diagnosis not present

## 2020-10-01 DIAGNOSIS — M7989 Other specified soft tissue disorders: Secondary | ICD-10-CM | POA: Diagnosis not present

## 2020-10-01 MED ORDER — ACETAMINOPHEN 500 MG PO TABS
1000.0000 mg | ORAL_TABLET | Freq: Once | ORAL | Status: AC
  Administered 2020-10-01: 1000 mg via ORAL
  Filled 2020-10-01: qty 2

## 2020-10-01 MED ORDER — MORPHINE SULFATE 15 MG PO TABS: 7.5000 mg | ORAL_TABLET | ORAL | 0 refills | Status: DC | PRN

## 2020-10-01 MED ORDER — DICLOFENAC SODIUM 1 % EX GEL: 4.0000 g | Freq: Four times a day (QID) | CUTANEOUS | 0 refills | Status: AC

## 2020-10-01 MED ORDER — OXYCODONE HCL 5 MG PO TABS
5.0000 mg | ORAL_TABLET | Freq: Once | ORAL | Status: AC
  Administered 2020-10-01: 5 mg via ORAL
  Filled 2020-10-01: qty 1

## 2020-10-01 NOTE — ED Notes (Signed)
Posterior splint/stirrup splint applied to left lower extremity by Sheppard Coil, NT.

## 2020-10-01 NOTE — ED Triage Notes (Signed)
Pt states she tripped/fell last night-pain to left ankle and knee-to triage in w/c-NAD

## 2020-10-01 NOTE — ED Provider Notes (Signed)
[facility] EMERGENCY DEPARTMENT Provider Note   CSN: [id number] Arrival date & time: 10/01/20  1124     History Chief Complaint  Patient presents with  . Fall    Jodi Medina is a 80 y.o. female.  80yo F with a chief complaints of a fall.  The patient tripped over her dog last night when she was getting ready to go to bed.  [address] and she inverted her ankle and since then has had pain and swelling to the ankle that radiates up to the knee.  Is able to ambulate though with some pain.  Denies any other injury denies head injury denies loss of consciousness denies neck pain back pain chest pain abdominal pain.  Denies upper extremity pain.  Has a history of a meniscal repair to that knee in the past.  The history is provided by the patient and the spouse.  Illness Severity:  Moderate Onset quality:  Sudden Duration:  1 day Timing:  Constant Progression:  Worsening Chronicity:  New Associated symptoms: myalgias   Associated symptoms: no chest pain, no congestion, no fever, no headaches, no nausea, no rhinorrhea, no shortness of breath, no vomiting and no wheezing        Past Medical History:  Diagnosis Date  . Diverticulitis   . High cholesterol   . High triglycerides   . Lactose intolerance   . Skin cancer   . Torn meniscus    bilateral  . Tremor     Patient Active Problem List   Diagnosis Date Noted  . FHx: dementia 07/28/2017  . Benign essential tremor 07/11/2015  . Hyperlipidemia 07/11/2015  . Back pain 07/10/2015  . Enteritis due to Clostridium difficile 11/11/2014  . Nausea and vomiting 11/10/2014  . History of diverticulitis 11/09/2014    Past Surgical History:  Procedure Laterality Date  . BREAST EXCISIONAL BIOPSY Left 1990  . DENTAL SURGERY  2017  . INJECTION KNEE Right 2020   "gel"  . microscopic knee surgery Right 12/2017   for torn meniscus  . 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 skin cancer removal  1990s  .  TONSILLECTOMY     as a child  . WISDOM TOOTH EXTRACTION       OB History   No obstetric history on file.     Family History  Problem Relation Age of Onset  . Hyperlipidemia Mother   . Tremor Mother   . Osteoporosis Mother   . Dementia Mother   . Heart failure Mother   . Alzheimer's disease Father   . Cancer Maternal Aunt        breast  . Breast cancer Maternal Aunt   . Stroke Maternal Grandmother   . Heart disease Maternal Uncle   . Heart disease Paternal Uncle     Social History   Tobacco Use  . Smoking status: Never Smoker  . Smokeless tobacco: Never Used  Vaping Use  . Vaping Use: Never used  Substance Use Topics  . Alcohol use: Yes    Comment: occ  . Drug use: No    Home Medications Prior to Admission medications   Medication Sig Start Date End Date Taking? Authorizing Provider  aspirin EC 81 MG tablet Take 81 mg by mouth daily. Swallow whole.   Yes [provider]  atorvastatin (LIPITOR) 10 MG tablet Take 5 mg by mouth daily.    Yes [provider]  CALCIUM CITRATE PO Take 1 tablet by  mouth daily. Has Vitamin D 250 units  Calcium 315 mg   Yes [provider]  carbidopa-levodopa (SINEMET IR) 25-100 MG tablet Take 1 tablet by mouth 3 (three) times daily.   Yes [provider]  Cholecalciferol (VITAMIN D3 PO) Take 125 mcg by mouth daily.    Yes [provider]  diclofenac Sodium (VOLTAREN) 1 % GEL Apply 4 g topically 4 (four) times daily. 10/01/20  Yes Deno Etienne, DO  morphine (MSIR) 15 MG tablet Take 0.5 tablets (7.5 mg total) by mouth every 4 (four) hours as needed for severe pain. 10/01/20  Yes Deno Etienne, DO  Probiotic Product (ALIGN PO) Take 1 tablet by mouth daily.   Yes [provider]  propranolol (INDERAL) 10 MG tablet TAKE 1 TABLET(10 MG) BY MOUTH THREE TIMES DAILY AS NEEDED 12/25/19  Yes Lomax, Amy, NP  Wheat Dextrin (BENEFIBER) POWD Take by mouth daily.   Yes [provider]  B Complex-Folic  Acid (B COMPLEX-VITAMIN B12 PO) Take 1 tablet by mouth daily.    [provider]  gabapentin (NEURONTIN) 100 MG capsule TAKE 1 CAPSULE(100 MG) BY MOUTH THREE TIMES DAILY 07/09/19   Melvenia Beam, MD  gabapentin (NEURONTIN) 100 MG capsule Take 1 capsule (100 mg total) by mouth 3 (three) times daily. 08/01/19   Lomax, Amy, NP    Allergies    Lactose intolerance (gi)  Review of Systems   Review of Systems  Constitutional: Negative for chills and fever.  HENT: Negative for congestion and rhinorrhea.   Eyes: Negative for redness and visual disturbance.  Respiratory: Negative for shortness of breath and wheezing.   Cardiovascular: Negative for chest pain and palpitations.  Gastrointestinal: Negative for nausea and vomiting.  Genitourinary: Negative for dysuria and urgency.  Musculoskeletal: Positive for arthralgias and myalgias.  Skin: Negative for pallor and wound.  Neurological: Negative for dizziness and headaches.    Physical Exam Updated Vital Signs BP 122/80 (BP Location: Right Arm)   Pulse (!) 58   Temp 98.5 F (36.9 C) (Oral)   Resp 16   Ht 5\' 3"  (1.6 m)   Wt 68.9 kg   SpO2 98%   BMI 26.93 kg/m   Physical Exam Vitals and nursing note reviewed.  Constitutional:      General: She is not in acute distress.    Appearance: She is well-developed. She is not diaphoretic.  HENT:     Head: Normocephalic and atraumatic.  Eyes:     Pupils: Pupils are equal, round, and reactive to light.  Cardiovascular:     Rate and Rhythm: Normal rate and regular rhythm.     Heart sounds: No murmur heard. No friction rub. No gallop.   Pulmonary:     Effort: Pulmonary effort is normal.     Breath sounds: No wheezing or rales.  Abdominal:     General: There is no distension.     Palpations: Abdomen is soft.     Tenderness: There is no abdominal tenderness.  Musculoskeletal:        General: Tenderness present.     Cervical back: Normal range of motion and neck supple.      Comments: Pain and swelling to the ankle, pain over both malleolus.  No pain to the base of the fifth metatarsal or the navicular.  No pain on palpation of the foot.  Pulse motor and sensation are intact distally.  There is pain at the proximal fibula.  Mild edema to the knee.  No  pain on the femur or the hip.  Internal and external rotation of the leg without pain in the hip.  No pain to the pelvis.  Palpated from head to toe without any other noted areas of bony tenderness.  Skin:    General: Skin is warm and dry.  Neurological:     Mental Status: She is alert and oriented to person, place, and time.  Psychiatric:        Behavior: Behavior normal.     ED Results / Procedures / Treatments   Labs (all labs ordered are listed, but only abnormal results are displayed) Labs Reviewed - No data to display  EKG None  Radiology DG Ankle Complete Left  Result Date: 10/01/2020 CLINICAL DATA:  Pain following fall EXAM: LEFT ANKLE COMPLETE - 3+ VIEW COMPARISON:  None. FINDINGS: Frontal, oblique, and lateral views were obtained. There is soft tissue swelling. There is a spiral type fracture of the distal fibular diaphysis with fracture fragments in near anatomic alignment. There is evidence of an old avulsion medially with well corticated calcification in this area. There is no appreciable joint effusion. No appreciable joint space narrowing or erosion. Ankle mortise appears intact. There is an inferior calcaneal spur. IMPRESSION: Spiral fracture distal fibula with alignment near anatomic. Old healed fracture medial malleolus with well corticated calcification in this area. There is soft tissue swelling. No appreciable joint space narrowing or erosion. Ankle mortise appears grossly intact. Electronically Signed   By: Lowella Grip III M.D.   On: 10/01/2020 12:58   DG Knee Complete 4 Views Left  Result Date: 10/01/2020 CLINICAL DATA:  Tripped over dog last night, pain EXAM: LEFT KNEE - COMPLETE 4+  VIEW COMPARISON:  None FINDINGS: Osseous demineralization. Medial compartment joint space narrowing and spur formation. Less significant degenerative changes at patellofemoral joint. Minimally displaced oblique fracture at the LEFT fibular neck. No additional fracture, dislocation, or bone destruction. IMPRESSION: Minimally displaced LEFT fibular neck fracture. Osseous demineralization with mild degenerative changes of the LEFT knee joint. Electronically Signed   By: Lavonia Dana M.D.   On: 10/01/2020 13:01    Procedures Procedures   Medications Ordered in ED Medications  acetaminophen (TYLENOL) tablet 1,000 mg (1,000 mg Oral Given 10/01/20 1200)  oxyCODONE (Oxy IR/ROXICODONE) immediate release tablet 5 mg (5 mg Oral Given 10/01/20 1200)    ED Course  I have reviewed the triage vital signs and the nursing notes.  Pertinent labs & imaging results that were available during my care of the patient were reviewed by me and considered in my medical decision making (see chart for details).    MDM Rules/Calculators/A&P                          80 yo F with a chief complaint of left ankle and knee pain after a fall last night.  Nonsyncopal by history.  Will obtain plain films.  Plain film viewed by me with the distal fibula fracture likely Weber B, also with proximal fibular neck fracture.  Likely Maisonneuve fracture based on history and imaging.  Will immobilize.  Have him follow-up with orthopedics in the office.  1:11 PM:  I have discussed the diagnosis/risks/treatment options with the patient and family and believe the pt to be eligible for discharge home to follow-up with Ortho. We also discussed returning to the ED immediately if new or worsening sx occur. We discussed the sx which are most concerning (e.g., sudden worsening pain,  fever, inability to tolerate by mouth) that necessitate immediate return. Medications administered to the patient during their visit and any new prescriptions provided  to the patient are listed below.  Medications given during this visit Medications  acetaminophen (TYLENOL) tablet 1,000 mg (1,000 mg Oral Given 10/01/20 1200)  oxyCODONE (Oxy IR/ROXICODONE) immediate release tablet 5 mg (5 mg Oral Given 10/01/20 1200)     The patient appears reasonably screen and/or stabilized for discharge and I doubt any other medical condition or other [facility] requiring further screening, evaluation, or treatment in the ED at this time prior to discharge.   Final Clinical Impression(s) / ED Diagnoses Final diagnoses:  Closed nondisplaced Maisonneuve fracture of left lower extremity, initial encounter    Rx / DC Orders ED Discharge Orders         Ordered    morphine (MSIR) 15 MG tablet  Every 4 hours PRN        10/01/20 1309    diclofenac Sodium (VOLTAREN) 1 % GEL  4 times daily        10/01/20 [place], Kariss Longmire, DO 10/01/20 1311

## 2020-10-01 NOTE — Discharge Instructions (Addendum)
You have broken your ankle.  We are going to place it in a splint.  He should try not to bear any weight on this leg.  Try to keep it elevated whenever you are not walking on it.  Please call your orthopedic doctor to see when they went to see you in the office.  Unfortunately this is an injury that commonly needs surgery.  Your orthopedic surgeon will make that determination.  Take tylenol 1000mg (2 extra strength) four times a day. Use the gel as prescribed  Then take the pain medicine if you feel like you need it. Narcotics do not help with the pain, they only make you care about it less.  You can become addicted to this, people may break into your house to steal it.  It will constipate you.  If you drive under the influence of this medicine you can get a DUI.

## 2020-10-01 NOTE — ED Notes (Signed)
Patient transported to X-ray 

## 2020-10-01 NOTE — ED Notes (Signed)
Pt assisted to BR via WC. °

## 2020-10-03 DIAGNOSIS — M25572 Pain in left ankle and joints of left foot: Secondary | ICD-10-CM | POA: Diagnosis not present

## 2020-10-03 DIAGNOSIS — S8265XA Nondisplaced fracture of lateral malleolus of left fibula, initial encounter for closed fracture: Secondary | ICD-10-CM | POA: Diagnosis not present

## 2020-10-08 DIAGNOSIS — S8265XD Nondisplaced fracture of lateral malleolus of left fibula, subsequent encounter for closed fracture with routine healing: Secondary | ICD-10-CM | POA: Diagnosis not present

## 2020-10-17 DIAGNOSIS — S8265XD Nondisplaced fracture of lateral malleolus of left fibula, subsequent encounter for closed fracture with routine healing: Secondary | ICD-10-CM | POA: Diagnosis not present

## 2020-10-31 DIAGNOSIS — S8265XD Nondisplaced fracture of lateral malleolus of left fibula, subsequent encounter for closed fracture with routine healing: Secondary | ICD-10-CM | POA: Diagnosis not present

## 2020-11-21 DIAGNOSIS — S8265XD Nondisplaced fracture of lateral malleolus of left fibula, subsequent encounter for closed fracture with routine healing: Secondary | ICD-10-CM | POA: Diagnosis not present

## 2020-11-29 DIAGNOSIS — M25572 Pain in left ankle and joints of left foot: Secondary | ICD-10-CM | POA: Diagnosis not present

## 2020-11-29 DIAGNOSIS — M6281 Muscle weakness (generalized): Secondary | ICD-10-CM | POA: Diagnosis not present

## 2020-11-29 DIAGNOSIS — M25672 Stiffness of left ankle, not elsewhere classified: Secondary | ICD-10-CM | POA: Diagnosis not present

## 2020-11-29 DIAGNOSIS — R262 Difficulty in walking, not elsewhere classified: Secondary | ICD-10-CM | POA: Diagnosis not present

## 2020-12-02 DIAGNOSIS — M25572 Pain in left ankle and joints of left foot: Secondary | ICD-10-CM | POA: Diagnosis not present

## 2020-12-02 DIAGNOSIS — R262 Difficulty in walking, not elsewhere classified: Secondary | ICD-10-CM | POA: Diagnosis not present

## 2020-12-02 DIAGNOSIS — M25672 Stiffness of left ankle, not elsewhere classified: Secondary | ICD-10-CM | POA: Diagnosis not present

## 2020-12-02 DIAGNOSIS — M6281 Muscle weakness (generalized): Secondary | ICD-10-CM | POA: Diagnosis not present

## 2020-12-11 DIAGNOSIS — M25572 Pain in left ankle and joints of left foot: Secondary | ICD-10-CM | POA: Diagnosis not present

## 2020-12-11 DIAGNOSIS — M25672 Stiffness of left ankle, not elsewhere classified: Secondary | ICD-10-CM | POA: Diagnosis not present

## 2020-12-11 DIAGNOSIS — R262 Difficulty in walking, not elsewhere classified: Secondary | ICD-10-CM | POA: Diagnosis not present

## 2020-12-11 DIAGNOSIS — M6281 Muscle weakness (generalized): Secondary | ICD-10-CM | POA: Diagnosis not present

## 2020-12-18 DIAGNOSIS — M25572 Pain in left ankle and joints of left foot: Secondary | ICD-10-CM | POA: Diagnosis not present

## 2020-12-18 DIAGNOSIS — M25672 Stiffness of left ankle, not elsewhere classified: Secondary | ICD-10-CM | POA: Diagnosis not present

## 2020-12-18 DIAGNOSIS — R262 Difficulty in walking, not elsewhere classified: Secondary | ICD-10-CM | POA: Diagnosis not present

## 2020-12-18 DIAGNOSIS — M6281 Muscle weakness (generalized): Secondary | ICD-10-CM | POA: Diagnosis not present

## 2020-12-19 DIAGNOSIS — M25572 Pain in left ankle and joints of left foot: Secondary | ICD-10-CM | POA: Diagnosis not present

## 2020-12-25 DIAGNOSIS — M25572 Pain in left ankle and joints of left foot: Secondary | ICD-10-CM | POA: Diagnosis not present

## 2020-12-25 DIAGNOSIS — R262 Difficulty in walking, not elsewhere classified: Secondary | ICD-10-CM | POA: Diagnosis not present

## 2020-12-25 DIAGNOSIS — M25672 Stiffness of left ankle, not elsewhere classified: Secondary | ICD-10-CM | POA: Diagnosis not present

## 2020-12-25 DIAGNOSIS — M6281 Muscle weakness (generalized): Secondary | ICD-10-CM | POA: Diagnosis not present

## 2020-12-30 DIAGNOSIS — M25572 Pain in left ankle and joints of left foot: Secondary | ICD-10-CM | POA: Diagnosis not present

## 2020-12-30 DIAGNOSIS — R262 Difficulty in walking, not elsewhere classified: Secondary | ICD-10-CM | POA: Diagnosis not present

## 2020-12-30 DIAGNOSIS — M6281 Muscle weakness (generalized): Secondary | ICD-10-CM | POA: Diagnosis not present

## 2020-12-30 DIAGNOSIS — M25672 Stiffness of left ankle, not elsewhere classified: Secondary | ICD-10-CM | POA: Diagnosis not present

## 2021-01-08 DIAGNOSIS — R262 Difficulty in walking, not elsewhere classified: Secondary | ICD-10-CM | POA: Diagnosis not present

## 2021-01-08 DIAGNOSIS — M25572 Pain in left ankle and joints of left foot: Secondary | ICD-10-CM | POA: Diagnosis not present

## 2021-01-08 DIAGNOSIS — M6281 Muscle weakness (generalized): Secondary | ICD-10-CM | POA: Diagnosis not present

## 2021-01-08 DIAGNOSIS — M25672 Stiffness of left ankle, not elsewhere classified: Secondary | ICD-10-CM | POA: Diagnosis not present

## 2021-01-15 DIAGNOSIS — M6281 Muscle weakness (generalized): Secondary | ICD-10-CM | POA: Diagnosis not present

## 2021-01-15 DIAGNOSIS — M25672 Stiffness of left ankle, not elsewhere classified: Secondary | ICD-10-CM | POA: Diagnosis not present

## 2021-01-15 DIAGNOSIS — R262 Difficulty in walking, not elsewhere classified: Secondary | ICD-10-CM | POA: Diagnosis not present

## 2021-01-15 DIAGNOSIS — M25572 Pain in left ankle and joints of left foot: Secondary | ICD-10-CM | POA: Diagnosis not present

## 2021-01-20 DIAGNOSIS — M25572 Pain in left ankle and joints of left foot: Secondary | ICD-10-CM | POA: Diagnosis not present

## 2021-01-20 DIAGNOSIS — M25562 Pain in left knee: Secondary | ICD-10-CM | POA: Diagnosis not present

## 2021-01-23 HISTORY — PX: DEEP BRAIN STIMULATOR PLACEMENT: SHX608

## 2021-02-12 DIAGNOSIS — R791 Abnormal coagulation profile: Secondary | ICD-10-CM | POA: Diagnosis not present

## 2021-02-12 DIAGNOSIS — G250 Essential tremor: Secondary | ICD-10-CM | POA: Diagnosis not present

## 2021-02-12 DIAGNOSIS — R251 Tremor, unspecified: Secondary | ICD-10-CM | POA: Diagnosis not present

## 2021-02-20 DIAGNOSIS — Z8619 Personal history of other infectious and parasitic diseases: Secondary | ICD-10-CM | POA: Diagnosis not present

## 2021-02-20 DIAGNOSIS — E785 Hyperlipidemia, unspecified: Secondary | ICD-10-CM | POA: Diagnosis not present

## 2021-02-20 DIAGNOSIS — G250 Essential tremor: Secondary | ICD-10-CM | POA: Diagnosis not present

## 2021-02-20 DIAGNOSIS — Z820 Family history of epilepsy and other diseases of the nervous system: Secondary | ICD-10-CM | POA: Diagnosis not present

## 2021-02-20 DIAGNOSIS — Z7982 Long term (current) use of aspirin: Secondary | ICD-10-CM | POA: Diagnosis not present

## 2021-02-22 HISTORY — PX: DEEP BRAIN STIMULATOR PLACEMENT: SHX608

## 2021-03-06 DIAGNOSIS — Z9682 Presence of neurostimulator: Secondary | ICD-10-CM | POA: Diagnosis not present

## 2021-03-06 DIAGNOSIS — G250 Essential tremor: Secondary | ICD-10-CM | POA: Diagnosis not present

## 2021-03-06 DIAGNOSIS — Z48811 Encounter for surgical aftercare following surgery on the nervous system: Secondary | ICD-10-CM | POA: Diagnosis not present

## 2021-03-06 DIAGNOSIS — Z9689 Presence of other specified functional implants: Secondary | ICD-10-CM | POA: Insufficient documentation

## 2021-03-13 DIAGNOSIS — G250 Essential tremor: Secondary | ICD-10-CM | POA: Diagnosis not present

## 2021-03-13 DIAGNOSIS — R791 Abnormal coagulation profile: Secondary | ICD-10-CM | POA: Diagnosis not present

## 2021-03-21 DIAGNOSIS — R519 Headache, unspecified: Secondary | ICD-10-CM | POA: Diagnosis not present

## 2021-03-21 DIAGNOSIS — G20 Parkinson's disease: Secondary | ICD-10-CM | POA: Diagnosis not present

## 2021-03-21 DIAGNOSIS — Z9682 Presence of neurostimulator: Secondary | ICD-10-CM | POA: Diagnosis not present

## 2021-03-21 DIAGNOSIS — G252 Other specified forms of tremor: Secondary | ICD-10-CM | POA: Diagnosis not present

## 2021-03-27 DIAGNOSIS — G250 Essential tremor: Secondary | ICD-10-CM | POA: Diagnosis not present

## 2021-03-27 DIAGNOSIS — Z9889 Other specified postprocedural states: Secondary | ICD-10-CM | POA: Diagnosis not present

## 2021-03-27 DIAGNOSIS — Z9689 Presence of other specified functional implants: Secondary | ICD-10-CM | POA: Diagnosis not present

## 2021-04-21 DIAGNOSIS — Z79899 Other long term (current) drug therapy: Secondary | ICD-10-CM | POA: Diagnosis not present

## 2021-04-21 DIAGNOSIS — Z1389 Encounter for screening for other disorder: Secondary | ICD-10-CM | POA: Diagnosis not present

## 2021-04-21 DIAGNOSIS — Z8619 Personal history of other infectious and parasitic diseases: Secondary | ICD-10-CM | POA: Diagnosis not present

## 2021-04-21 DIAGNOSIS — E559 Vitamin D deficiency, unspecified: Secondary | ICD-10-CM | POA: Diagnosis not present

## 2021-04-21 DIAGNOSIS — M85859 Other specified disorders of bone density and structure, unspecified thigh: Secondary | ICD-10-CM | POA: Diagnosis not present

## 2021-04-21 DIAGNOSIS — C4491 Basal cell carcinoma of skin, unspecified: Secondary | ICD-10-CM | POA: Diagnosis not present

## 2021-04-21 DIAGNOSIS — E785 Hyperlipidemia, unspecified: Secondary | ICD-10-CM | POA: Diagnosis not present

## 2021-04-21 DIAGNOSIS — Z8673 Personal history of transient ischemic attack (TIA), and cerebral infarction without residual deficits: Secondary | ICD-10-CM | POA: Diagnosis not present

## 2021-04-21 DIAGNOSIS — Z0000 Encounter for general adult medical examination without abnormal findings: Secondary | ICD-10-CM | POA: Diagnosis not present

## 2021-04-21 DIAGNOSIS — G250 Essential tremor: Secondary | ICD-10-CM | POA: Diagnosis not present

## 2021-04-23 DIAGNOSIS — R251 Tremor, unspecified: Secondary | ICD-10-CM | POA: Diagnosis not present

## 2021-04-23 DIAGNOSIS — G250 Essential tremor: Secondary | ICD-10-CM | POA: Diagnosis not present

## 2021-04-23 DIAGNOSIS — G20 Parkinson's disease: Secondary | ICD-10-CM | POA: Diagnosis not present

## 2021-04-23 DIAGNOSIS — Z9889 Other specified postprocedural states: Secondary | ICD-10-CM | POA: Diagnosis not present

## 2021-04-23 DIAGNOSIS — Z9689 Presence of other specified functional implants: Secondary | ICD-10-CM | POA: Diagnosis not present

## 2021-04-25 DIAGNOSIS — G20 Parkinson's disease: Secondary | ICD-10-CM | POA: Diagnosis not present

## 2021-04-25 DIAGNOSIS — G250 Essential tremor: Secondary | ICD-10-CM | POA: Diagnosis not present

## 2021-05-29 DIAGNOSIS — Z9689 Presence of other specified functional implants: Secondary | ICD-10-CM | POA: Diagnosis not present

## 2021-05-29 DIAGNOSIS — Z79899 Other long term (current) drug therapy: Secondary | ICD-10-CM | POA: Diagnosis not present

## 2021-05-29 DIAGNOSIS — G20 Parkinson's disease: Secondary | ICD-10-CM | POA: Diagnosis not present

## 2021-05-29 DIAGNOSIS — Z9889 Other specified postprocedural states: Secondary | ICD-10-CM | POA: Diagnosis not present

## 2021-05-29 DIAGNOSIS — Z8673 Personal history of transient ischemic attack (TIA), and cerebral infarction without residual deficits: Secondary | ICD-10-CM | POA: Diagnosis not present

## 2021-05-29 DIAGNOSIS — G250 Essential tremor: Secondary | ICD-10-CM | POA: Diagnosis not present

## 2021-06-05 DIAGNOSIS — R197 Diarrhea, unspecified: Secondary | ICD-10-CM | POA: Diagnosis not present

## 2021-06-05 DIAGNOSIS — M8580 Other specified disorders of bone density and structure, unspecified site: Secondary | ICD-10-CM | POA: Diagnosis not present

## 2021-07-24 DIAGNOSIS — G20 Parkinson's disease: Secondary | ICD-10-CM | POA: Diagnosis not present

## 2021-07-24 DIAGNOSIS — M8000XD Age-related osteoporosis with current pathological fracture, unspecified site, subsequent encounter for fracture with routine healing: Secondary | ICD-10-CM | POA: Diagnosis not present

## 2021-07-28 DIAGNOSIS — H524 Presbyopia: Secondary | ICD-10-CM | POA: Diagnosis not present

## 2021-07-28 DIAGNOSIS — H2513 Age-related nuclear cataract, bilateral: Secondary | ICD-10-CM | POA: Diagnosis not present

## 2021-09-24 DIAGNOSIS — R471 Dysarthria and anarthria: Secondary | ICD-10-CM | POA: Diagnosis not present

## 2021-09-24 DIAGNOSIS — G250 Essential tremor: Secondary | ICD-10-CM | POA: Diagnosis not present

## 2021-09-24 DIAGNOSIS — Z9689 Presence of other specified functional implants: Secondary | ICD-10-CM | POA: Diagnosis not present

## 2021-09-24 DIAGNOSIS — G252 Other specified forms of tremor: Secondary | ICD-10-CM | POA: Diagnosis not present

## 2021-09-24 DIAGNOSIS — Z4549 Encounter for adjustment and management of other implanted nervous system device: Secondary | ICD-10-CM | POA: Diagnosis not present

## 2021-09-24 DIAGNOSIS — G20 Parkinson's disease: Secondary | ICD-10-CM | POA: Diagnosis not present

## 2021-10-21 DIAGNOSIS — D1801 Hemangioma of skin and subcutaneous tissue: Secondary | ICD-10-CM | POA: Diagnosis not present

## 2021-10-21 DIAGNOSIS — L821 Other seborrheic keratosis: Secondary | ICD-10-CM | POA: Diagnosis not present

## 2021-10-21 DIAGNOSIS — L814 Other melanin hyperpigmentation: Secondary | ICD-10-CM | POA: Diagnosis not present

## 2021-10-21 DIAGNOSIS — L918 Other hypertrophic disorders of the skin: Secondary | ICD-10-CM | POA: Diagnosis not present

## 2021-10-21 DIAGNOSIS — L738 Other specified follicular disorders: Secondary | ICD-10-CM | POA: Diagnosis not present

## 2021-10-21 DIAGNOSIS — Z85828 Personal history of other malignant neoplasm of skin: Secondary | ICD-10-CM | POA: Diagnosis not present

## 2021-10-21 DIAGNOSIS — I788 Other diseases of capillaries: Secondary | ICD-10-CM | POA: Diagnosis not present

## 2022-03-27 DIAGNOSIS — G250 Essential tremor: Secondary | ICD-10-CM | POA: Diagnosis not present

## 2022-03-27 DIAGNOSIS — G20A1 Parkinson's disease without dyskinesia, without mention of fluctuations: Secondary | ICD-10-CM | POA: Diagnosis not present

## 2022-03-27 DIAGNOSIS — G20C Parkinsonism, unspecified: Secondary | ICD-10-CM | POA: Diagnosis not present

## 2022-03-27 DIAGNOSIS — Z9682 Presence of neurostimulator: Secondary | ICD-10-CM | POA: Diagnosis not present

## 2022-03-27 DIAGNOSIS — Z9689 Presence of other specified functional implants: Secondary | ICD-10-CM | POA: Diagnosis not present

## 2022-03-27 DIAGNOSIS — Z79899 Other long term (current) drug therapy: Secondary | ICD-10-CM | POA: Diagnosis not present

## 2022-04-21 DIAGNOSIS — H52203 Unspecified astigmatism, bilateral: Secondary | ICD-10-CM | POA: Diagnosis not present

## 2022-04-21 DIAGNOSIS — H2513 Age-related nuclear cataract, bilateral: Secondary | ICD-10-CM | POA: Diagnosis not present

## 2022-04-27 DIAGNOSIS — Z8616 Personal history of COVID-19: Secondary | ICD-10-CM | POA: Diagnosis not present

## 2022-04-27 DIAGNOSIS — C4491 Basal cell carcinoma of skin, unspecified: Secondary | ICD-10-CM | POA: Diagnosis not present

## 2022-04-27 DIAGNOSIS — I6930 Unspecified sequelae of cerebral infarction: Secondary | ICD-10-CM | POA: Diagnosis not present

## 2022-04-27 DIAGNOSIS — M8000XD Age-related osteoporosis with current pathological fracture, unspecified site, subsequent encounter for fracture with routine healing: Secondary | ICD-10-CM | POA: Diagnosis not present

## 2022-04-27 DIAGNOSIS — R258 Other abnormal involuntary movements: Secondary | ICD-10-CM | POA: Diagnosis not present

## 2022-04-27 DIAGNOSIS — Z8673 Personal history of transient ischemic attack (TIA), and cerebral infarction without residual deficits: Secondary | ICD-10-CM | POA: Diagnosis not present

## 2022-04-27 DIAGNOSIS — Z8781 Personal history of (healed) traumatic fracture: Secondary | ICD-10-CM | POA: Diagnosis not present

## 2022-04-27 DIAGNOSIS — Z0000 Encounter for general adult medical examination without abnormal findings: Secondary | ICD-10-CM | POA: Diagnosis not present

## 2022-04-27 DIAGNOSIS — Z1331 Encounter for screening for depression: Secondary | ICD-10-CM | POA: Diagnosis not present

## 2022-04-27 DIAGNOSIS — E785 Hyperlipidemia, unspecified: Secondary | ICD-10-CM | POA: Diagnosis not present

## 2022-04-28 ENCOUNTER — Other Ambulatory Visit: Payer: Self-pay | Admitting: Internal Medicine

## 2022-04-28 DIAGNOSIS — E785 Hyperlipidemia, unspecified: Secondary | ICD-10-CM | POA: Diagnosis not present

## 2022-04-28 DIAGNOSIS — G250 Essential tremor: Secondary | ICD-10-CM | POA: Diagnosis not present

## 2022-04-28 DIAGNOSIS — Z8673 Personal history of transient ischemic attack (TIA), and cerebral infarction without residual deficits: Secondary | ICD-10-CM | POA: Diagnosis not present

## 2022-04-28 DIAGNOSIS — M810 Age-related osteoporosis without current pathological fracture: Secondary | ICD-10-CM

## 2022-04-28 DIAGNOSIS — G20C Parkinsonism, unspecified: Secondary | ICD-10-CM | POA: Diagnosis not present

## 2022-05-07 DIAGNOSIS — Z03818 Encounter for observation for suspected exposure to other biological agents ruled out: Secondary | ICD-10-CM | POA: Diagnosis not present

## 2022-05-07 DIAGNOSIS — R059 Cough, unspecified: Secondary | ICD-10-CM | POA: Diagnosis not present

## 2022-05-25 HISTORY — PX: CATARACT EXTRACTION: SUR2

## 2022-06-10 DIAGNOSIS — H269 Unspecified cataract: Secondary | ICD-10-CM | POA: Diagnosis not present

## 2022-06-10 DIAGNOSIS — H2511 Age-related nuclear cataract, right eye: Secondary | ICD-10-CM | POA: Diagnosis not present

## 2022-07-24 HISTORY — PX: MULTIPLE TOOTH EXTRACTIONS: SHX2053

## 2022-08-24 HISTORY — PX: CATARACT EXTRACTION: SUR2

## 2022-09-01 DIAGNOSIS — M810 Age-related osteoporosis without current pathological fracture: Secondary | ICD-10-CM | POA: Diagnosis not present

## 2022-09-01 DIAGNOSIS — N63 Unspecified lump in unspecified breast: Secondary | ICD-10-CM | POA: Diagnosis not present

## 2022-09-02 ENCOUNTER — Other Ambulatory Visit: Payer: Self-pay | Admitting: Internal Medicine

## 2022-09-02 ENCOUNTER — Encounter: Payer: Self-pay | Admitting: Internal Medicine

## 2022-09-02 DIAGNOSIS — H269 Unspecified cataract: Secondary | ICD-10-CM | POA: Diagnosis not present

## 2022-09-02 DIAGNOSIS — N6310 Unspecified lump in the right breast, unspecified quadrant: Secondary | ICD-10-CM

## 2022-09-02 DIAGNOSIS — H2512 Age-related nuclear cataract, left eye: Secondary | ICD-10-CM | POA: Diagnosis not present

## 2022-09-07 ENCOUNTER — Other Ambulatory Visit: Payer: Self-pay | Admitting: Internal Medicine

## 2022-09-07 DIAGNOSIS — N6310 Unspecified lump in the right breast, unspecified quadrant: Secondary | ICD-10-CM

## 2022-09-18 ENCOUNTER — Ambulatory Visit
Admission: RE | Admit: 2022-09-18 | Discharge: 2022-09-18 | Disposition: A | Discharge status: Home / Self Care | Payer: Medicare PPO | Source: Ambulatory Visit | Attending: Internal Medicine | Admitting: Internal Medicine

## 2022-09-18 ENCOUNTER — Other Ambulatory Visit: Payer: Self-pay | Admitting: Internal Medicine

## 2022-09-18 DIAGNOSIS — N6310 Unspecified lump in the right breast, unspecified quadrant: Secondary | ICD-10-CM

## 2022-09-18 DIAGNOSIS — R92333 Mammographic heterogeneous density, bilateral breasts: Secondary | ICD-10-CM | POA: Diagnosis not present

## 2022-09-18 DIAGNOSIS — N6489 Other specified disorders of breast: Secondary | ICD-10-CM | POA: Diagnosis not present

## 2022-09-22 ENCOUNTER — Ambulatory Visit
Admission: RE | Admit: 2022-09-22 | Discharge: 2022-09-22 | Disposition: A | Discharge status: Home / Self Care | Payer: Medicare PPO | Source: Ambulatory Visit | Attending: Internal Medicine | Admitting: Internal Medicine

## 2022-09-22 DIAGNOSIS — C50211 Malignant neoplasm of upper-inner quadrant of right female breast: Secondary | ICD-10-CM | POA: Diagnosis not present

## 2022-09-22 DIAGNOSIS — C50811 Malignant neoplasm of overlapping sites of right female breast: Secondary | ICD-10-CM | POA: Diagnosis not present

## 2022-09-22 DIAGNOSIS — N6310 Unspecified lump in the right breast, unspecified quadrant: Secondary | ICD-10-CM

## 2022-09-22 DIAGNOSIS — N63 Unspecified lump in unspecified breast: Secondary | ICD-10-CM | POA: Diagnosis not present

## 2022-09-22 HISTORY — PX: BREAST BIOPSY: SHX20

## 2022-09-23 ENCOUNTER — Telehealth: Payer: Self-pay | Admitting: Hematology and Oncology

## 2022-09-23 ENCOUNTER — Encounter: Payer: Self-pay | Admitting: Student

## 2022-09-23 NOTE — Telephone Encounter (Signed)
Spoke to patient to confirm upcoming morning [facility] clinic appointment  on 5/1, paperwork will be sent via e-mail.   Gave location and time, also informed patient that the surgeon's office would be calling as well to get information from them similar to the packet that they will be receiving so make sure to do both.  Reminded patient that all providers will be coming to the clinic to see them HERE and if they had any questions to not hesitate to reach back out to myself or their navigators. 

## 2022-09-28 ENCOUNTER — Encounter: Payer: Self-pay | Admitting: *Deleted

## 2022-09-28 DIAGNOSIS — C50811 Malignant neoplasm of overlapping sites of right female breast: Secondary | ICD-10-CM | POA: Insufficient documentation

## 2022-09-28 DIAGNOSIS — Z170 Estrogen receptor positive status [ER+]: Secondary | ICD-10-CM

## 2022-09-28 NOTE — Progress Notes (Signed)
Radiation Oncology         [phone] ________________________________  Name: Jodi Medina        MRN: [id number]  Date of Service: 09/30/2022 DOB: [birth year]  WU:[id number], Soyla Murphy, MD  [name], MD     REFERRING PHYSICIAN: [name], MD   DIAGNOSIS: There were no encounter diagnoses.   HISTORY OF PRESENT ILLNESS: Jodi Medina is a 82 y.o. female seen in the multidisciplinary breast clinic for a new diagnosis of right breast cancer. The patient was noted to have a screening mammogram that showed a right breast asymmetry.  This was followed by further diagnostic workup that showed a developing asymmetry in the right breast in the 2 to 3 o'clock position, this led to further diagnostic imaging on 09/15/2022 that showed a 1.1 cm area of shadowing at the 1 o'clock position representing the asymmetry seen on mammography.  She underwent ultrasound on 09/18/2022 which showed a mass in the 12 o'clock position measuring 2.1 cm and a similar-appearing mass in the 1 o'clock position measuring 9 mm, there was a more vague but similar appearing area at the 11 o'clock position measuring 6 mm, a multicystic mass was seen originating from the primary mass and extending to the level of the nipple at 11:00 overall measuring 3 cm representing concern for dilated duct.  No evidence of axillary adenopathy.  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 The 12:00 specimen showed grade 3 invasive lobular carcinoma that was ER/PR positive, HER2 negative with a Ki-67 of 20%. The 11:00 lesion was not biopsied. She is seen today to discuss treatment recommendations of her cancer.    PREVIOUS RADIATION THERAPY: {EXAM; YES/NO:19492::"No"}   PAST MEDICAL HISTORY:  Past Medical History:  Diagnosis Date   Diverticulitis    High cholesterol    High triglycerides    Lactose intolerance     Skin cancer    Torn meniscus    bilateral   Tremor        PAST SURGICAL HISTORY: Past Surgical History:  Procedure Laterality Date   BREAST BIOPSY Right 09/22/2022   [place] RT BREAST BX W LOC DEV 1ST LESION IMG BX SPEC US GUIDE 09/22/2022 [facility]   BREAST BIOPSY Right 09/22/2022   [place] RT BREAST BX W LOC DEV EA ADD LESION IMG BX SPEC US GUIDE 09/22/2022 [facility]   BREAST EXCISIONAL BIOPSY Left 1990   DENTAL SURGERY  2017   INJECTION KNEE Right 2020   "gel"   microscopic knee surgery Right 12/2017   for torn meniscus   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 She has never used smokeless tobacco. She  reports current alcohol use. She reports that she does not use drugs.  The patient is married and resides in [place].  She***   ALLERGIES: Lactose intolerance (gi)   MEDICATIONS:  Current Outpatient Medications  Medication Sig Dispense Refill   aspirin EC 81 MG tablet Take 81 mg by mouth daily. Swallow whole.     atorvastatin (LIPITOR) 10 MG tablet Take 5 mg by mouth daily.      B Complex-Folic Acid (B COMPLEX-VITAMIN B12 PO) Take 1 tablet by mouth daily.     CALCIUM CITRATE PO Take 1 tablet by mouth daily. Has Vitamin D 250 units  Calcium 315 mg     carbidopa-levodopa (SINEMET IR) 25-100 MG tablet Take 1 tablet by mouth 3 (three) times daily.     Cholecalciferol (VITAMIN D3 PO) Take 125 mcg by mouth daily.      diclofenac Sodium  (VOLTAREN) 1 % GEL Apply 4 g topically 4 (four) times daily. 100 g 0   gabapentin (NEURONTIN) 100 MG capsule TAKE 1 CAPSULE(100 MG) BY MOUTH THREE TIMES DAILY 90 capsule 6   gabapentin (NEURONTIN) 100 MG capsule Take 1 capsule (100 mg total) by mouth 3 (three) times daily. 30 capsule 0   morphine (MSIR) 15 MG tablet Take 0.5 tablets (7.5 mg total) by mouth every 4 (four) hours as needed for severe pain. 7 tablet 0   Probiotic Product (ALIGN PO) Take 1 tablet by mouth daily.     propranolol (INDERAL) 10 MG tablet TAKE 1 TABLET(10 MG) BY MOUTH THREE TIMES DAILY AS NEEDED 270 tablet 4   Wheat Dextrin (BENEFIBER) POWD Take by mouth daily.     No current facility-administered medications for this visit.     REVIEW OF SYSTEMS: On review of systems, the patient reports that she is doing ***     PHYSICAL EXAM:  Wt Readings from Last 3 Encounters:  10/01/20 152 lb (68.9 kg)  04/17/19 165 lb 9.6 oz (75.1 kg)  10/11/18 156 lb (70.8 kg)   Temp Readings from Last 3 Encounters:  10/01/20 98 F (36.7 C) (Oral)  04/17/19 (!) 97.1 F (36.2 C) (Oral)  07/10/15 97.8 F (36.6 C) (Oral)   BP Readings from Last 3 Encounters:  10/01/20 122/80  04/17/19 139/80  07/28/17 119/73   Pulse Readings from Last 3 Encounters:  10/01/20 (!) 55  04/17/19 64  07/28/17 62    In general this is a well appearing *** female in no acute distress. She's alert and oriented x4 and appropriate throughout the examination. Cardiopulmonary assessment is negative for acute distress and she exhibits normal effort. Bilateral breast exam is deferred.    ECOG = ***  0 - Asymptomatic (Fully active, able to carry on all predisease activities without restriction)  1 - Symptomatic but completely ambulatory (Restricted in physically strenuous activity but ambulatory and able to carry out work of a light or sedentary nature. For example, light housework, office work)  2 - Symptomatic, <50% in bed during the day (Ambulatory  and capable of all self care but unable to carry out any work activities. Up and about more than 50% of waking hours)  3 - Symptomatic, >50% in bed, but not bedbound (Capable of only limited self-care, confined to bed or chair 50% or more of waking hours)  4 - Bedbound (Completely disabled. Cannot carry on any self-care. Totally confined to bed or chair)  5 - Death   Santiago Glad MM, Creech RH, Tormey DC, et al. [phone]). "Toxicity and response  criteria of the [facility]". Am. Evlyn Clines. Oncol. 5 (6): 649-55    LABORATORY DATA:  Lab Results  Component Value Date   WBC 4.9 11/09/2014   HGB 14.7 11/09/2014   HCT 42.8 11/09/2014   MCV 87.0 11/09/2014   PLT 190 11/09/2014   Lab Results  Component Value Date   NA 138 11/09/2014   K 4.1 11/09/2014   CL 102 11/09/2014   CO2 25 11/09/2014   Lab Results  Component Value Date   ALT 17 11/09/2014   AST 28 11/09/2014   ALKPHOS 50 11/09/2014   BILITOT 0.9 11/09/2014      RADIOGRAPHY: [place] RT BREAST BX W LOC DEV EA ADD LESION IMG BX SPEC US GUIDE  Addendum Date: 09/24/2022   ADDENDUM REPORT: 09/24/2022 08:53 ADDENDUM: Pathology revealed GRADE II INVASIVE MAMMARY (MORPHOLOGICALLY FAVOR LOBULAR; E-CADHERIN PENDING) CARCINOMA, CALCIFICATIONS: PRESENT of the RIGHT breast, 1:00 o'clock, 3 cmfn, (wing clip). This was found to be concordant by Dr. Bary Richard. Pathology revealed GRADE III INVASIVE MAMMARY (E-CADHERIN PENDING) CARCINOMA of the RIGHT breast, 12:00 o'clock, 3 cmfn, (coil clip). This was found to be concordant by Dr. Bary Richard. Pathology results were discussed with the patient by telephone. The patient reported doing well after the biopsy with tenderness at the site. Post biopsy instructions and care were reviewed and questions were answered. The patient was encouraged to call [facility] for any additional concerns. My direct phone number was provided. The patient was [facility] to [facility] on Sep 30, 2022. NOTE: If breast conservation is a consideration, additional findings at the 11 o'clock position 1 and 3 cm from the nipple will need to be biopsied. Consider further evaluation with breast MRI to demonstrate extent of disease. Pathology results reported by Rene Kocher, RN on 09/23/2022. Electronically Signed   By: Bary Richard M.D.   On: 09/24/2022 08:53   Result Date: 09/24/2022 CLINICAL DATA:  Patient presents today for ultrasound-guided core biopsies of highly suspicious masses within the RIGHT breast at the 1 o'clock axis, 3 cm from the nipple and at the 12 o'clock axis, 3 cm from the nipple. EXAM: ULTRASOUND GUIDED RIGHT BREAST CORE NEEDLE BIOPSY x2 COMPARISON:  Previous exam(s). PROCEDURE: I met with the patient and we discussed the procedure of ultrasound-guided biopsy, including benefits and alternatives. We discussed the high likelihood of a successful procedure. We discussed the risks of the procedure, including infection, bleeding, tissue injury, clip migration, and inadequate sampling. Informed written consent was given. The usual time-out protocol was performed immediately prior to the procedure. Site 1: Lesion quadrant: Upper inner quadrant Using sterile technique and 1% Lidocaine as local anesthetic, under direct ultrasound visualization, a 12 gauge spring-loaded device was used to perform biopsy of the RIGHT breast mass at the 1 o'clock axis, 3 cm from the nipple, using a inferomedial approach. At the conclusion of the procedure , a wing shaped tissue marker clip was deployed into the biopsy cavity. Site 2: Lesion quadrant: 12 o'clock Using sterile technique and 1% Lidocaine as local anesthetic, under direct ultrasound visualization, a 12 gauge spring-loaded device was used to perform biopsy of the RIGHT breast mass at the 12 o'clock axis, 3 cm from the nipple, using a inferomedial approach. At the  conclusion of the procedure , a coil shaped tissue marker clip was deployed into the biopsy cavity. Follow up 2 view mammogram was performed and  dictated separately. IMPRESSION: 1. Ultrasound guided biopsy of the RIGHT breast mass at the 1 o'clock axis, 3 cm from the nipple (wing shaped clip). No apparent complications. 2. Ultrasound guided biopsy of the RIGHT breast mass at the 12 o'clock axis, 3 cm from the nipple (coil shaped clip). No apparent complications. Electronically Signed: By: Bary Richard M.D. On: 09/22/2022 12:11  [place] RT BREAST BX W LOC DEV 1ST LESION IMG BX SPEC US GUIDE  Addendum Date: 09/24/2022   ADDENDUM REPORT: 09/24/2022 08:53 ADDENDUM: Pathology revealed GRADE II INVASIVE MAMMARY (MORPHOLOGICALLY FAVOR LOBULAR; E-CADHERIN PENDING) CARCINOMA, CALCIFICATIONS: PRESENT of the RIGHT breast, 1:00 o'clock, 3 cmfn, (wing clip). This was found to be concordant by Dr. Bary Richard. Pathology revealed GRADE III INVASIVE MAMMARY (E-CADHERIN PENDING) CARCINOMA of the RIGHT breast, 12:00 o'clock, 3 cmfn, (coil clip). This was found to be concordant by Dr. Bary Richard. Pathology results were discussed with the patient by telephone. The patient reported doing well after the biopsy with tenderness at the site. Post biopsy instructions and care were reviewed and questions were answered. The patient was encouraged to call [facility] for any additional concerns. My direct phone number was provided. The patient was referred to [facility] on Sep 30, 2022. NOTE: If breast conservation is a consideration, additional findings at the 11 o'clock position 1 and 3 cm from the nipple will need to be biopsied. Consider further evaluation with breast MRI to demonstrate extent of disease. Pathology results reported by Rene Kocher, RN on 09/23/2022. Electronically Signed   By: Bary Richard M.D.   On: 09/24/2022 08:53    Result Date: 09/24/2022 CLINICAL DATA:  Patient presents today for ultrasound-guided core biopsies of highly suspicious masses within the RIGHT breast at the 1 o'clock axis, 3 cm from the nipple and at the 12 o'clock axis, 3 cm from the nipple. EXAM: ULTRASOUND GUIDED RIGHT BREAST CORE NEEDLE BIOPSY x2 COMPARISON:  Previous exam(s). PROCEDURE: I met with the patient and we discussed the procedure of ultrasound-guided biopsy, including benefits and alternatives. We discussed the high likelihood of a successful procedure. We discussed the risks of the procedure, including infection, bleeding, tissue injury, clip migration, and inadequate sampling. Informed written consent was given. The usual time-out protocol was performed immediately prior to the procedure. Site 1: Lesion quadrant: Upper inner quadrant Using sterile technique and 1% Lidocaine as local anesthetic, under direct ultrasound visualization, a 12 gauge spring-loaded device was used to perform biopsy of the RIGHT breast mass at the 1 o'clock axis, 3 cm from the nipple, using a inferomedial approach. At the conclusion of the procedure , a wing shaped tissue marker clip was deployed into the biopsy cavity. Site 2: Lesion quadrant: 12 o'clock Using sterile technique and 1% Lidocaine as local anesthetic, under direct ultrasound visualization, a 12 gauge spring-loaded device was used to perform biopsy of the RIGHT breast mass at the 12 o'clock axis, 3 cm from the nipple, using a inferomedial approach. At the conclusion of the procedure , a coil shaped tissue marker clip was deployed into the biopsy cavity. Follow up 2 view mammogram was performed and dictated separately. IMPRESSION: 1. Ultrasound guided biopsy of the RIGHT breast mass at the 1 o'clock axis, 3 cm from the nipple (wing shaped clip). No apparent complications. 2. Ultrasound guided biopsy of the RIGHT breast mass at the 12 o'clock axis, 3 cm from the nipple (coil shaped clip). No  apparent  complications. Electronically Signed: By: Bary Richard M.D. On: 09/22/2022 12:11  MM CLIP PLACEMENT RIGHT  Result Date: 09/22/2022 CLINICAL DATA:  Status post ultrasound-guided biopsies for masses in the RIGHT breast at the 1 o'clock axis and 12 o'clock axis. EXAM: 3D DIAGNOSTIC RIGHT MAMMOGRAM POST ULTRASOUND BIOPSY x2 COMPARISON:  Previous exam(s). FINDINGS: 3D Mammographic images were obtained following ultrasound guided biopsy of a RIGHT breast mass at the 1 o'clock axis, 3 cm from nipple and then a RIGHT breast mass at the 12 o'clock axis, 3 cm from the nipple. The biopsy marking clips are expected position at the sites of biopsy. IMPRESSION: 1. Appropriate positioning of the wing shaped biopsy marking clip at the site of biopsy in the upper inner quadrant of the RIGHT breast corresponding to the targeted mass at the 1 o'clock axis. 2. Appropriate positioning of the coil shaped biopsy marking clip at the site of biopsy in the upper RIGHT breast corresponding to the targeted mass at the 12 o'clock axis. Final Assessment: Post Procedure Mammograms for Marker Placement Electronically Signed   By: Bary Richard M.D.   On: 09/22/2022 12:25  MM 3D DIAGNOSTIC MAMMOGRAM BILATERAL BREAST  Result Date: 09/18/2022 CLINICAL DATA:  Right nipple inversion for several months with an associated palpable. EXAM: DIGITAL DIAGNOSTIC BILATERAL MAMMOGRAM WITH TOMOSYNTHESIS; ULTRASOUND RIGHT BREAST LIMITED TECHNIQUE: Bilateral digital diagnostic mammography and breast tomosynthesis was performed.; Targeted ultrasound examination of the right breast was performed COMPARISON:  Previous exam(s). ACR Breast Density Category c: The breasts are heterogeneously dense, which may obscure small masses. FINDINGS: A radiopaque BB was placed at the site of the patient's right breast palpable lump. A large, hyperdense spiculated mass is seen just deep radiopaque BB. A similar appearing mass is seen slightly more posterior and medial.  There is overlying skin retraction and inversion of the nipple. No mammographic evidence of malignancy within the left breast. Targeted ultrasound is performed, showing an irregular, hypoechoic mass at the 12 o'clock position 3 cm from the nipple. It measures 2.1 x 2.0 x 1.5 cm with peripheral vascularity. A similar appearing mass is demonstrated at the 1 o'clock position 3 cm from the nipple. It measures 0.9 x 0.5 x 0.9 cm. A questioned, more vague but similar appearing mass is demonstrated at the 11 o'clock position 3 cm from the nipple. It measures 6 x 5 x 5 mm. A multi-cystic mass is seen originating from the primary mass and extending to the level of the nipple at the 11 o'clock position 1 cm from the nipple. Overall, it measures 3.0 x 2.1 x 1.3 cm. This may represent a focally dilated duct. Evaluation of the right axilla demonstrates no suspicious lymphadenopathy. IMPRESSION: 1. Highly suspicious right breast masses at the 12 o'clock position 3 cm from the nipple and 1 o'clock position 3 cm from the nipple. Recommend ultrasound-guided biopsy. 2. Question and third focal mass at the 11 o'clock position 3 cm from the nipple. Additional multi-cystic mass extending to the nipple at the 11 o'clock position 1 cm from the nipple. This may represent a focally dilated duct. 3. No suspicious right axillary lymphadenopathy. 4. No mammographic evidence of malignancy on the left. RECOMMENDATION: 1. Two area ultrasound-guided biopsy of the right breast at the 12 o'clock and 1 o'clock position. 2. Pending biopsy results, consider further evaluation with breast MRI to demonstrate extent of disease. If breast conservation is a consideration, additional findings at the 11 o'clock position 1 and 3 cm from the nipple will  need to be biopsied. I have discussed the findings and recommendations with the patient. If applicable, a reminder letter will be sent to the patient regarding the next appointment. BI-RADS CATEGORY  5: Highly  suggestive of malignancy. Electronically Signed   By: Sande Brothers M.D.   On: 09/18/2022 16:27  [place] LIMITED ULTRASOUND INCLUDING AXILLA RIGHT BREAST  Result Date: 09/18/2022 CLINICAL DATA:  Right nipple inversion for several months with an associated palpable. EXAM: DIGITAL DIAGNOSTIC BILATERAL MAMMOGRAM WITH TOMOSYNTHESIS; ULTRASOUND RIGHT BREAST LIMITED TECHNIQUE: Bilateral digital diagnostic mammography and breast tomosynthesis was performed.; Targeted ultrasound examination of the right breast was performed COMPARISON:  Previous exam(s). ACR Breast Density Category c: The breasts are heterogeneously dense, which may obscure small masses. FINDINGS: A radiopaque BB was placed at the site of the patient's right breast palpable lump. A large, hyperdense spiculated mass is seen just deep radiopaque BB. A similar appearing mass is seen slightly more posterior and medial. There is overlying skin retraction and inversion of the nipple. No mammographic evidence of malignancy within the left breast. Targeted ultrasound is performed, showing an irregular, hypoechoic mass at the 12 o'clock position 3 cm from the nipple. It measures 2.1 x 2.0 x 1.5 cm with peripheral vascularity. A similar appearing mass is demonstrated at the 1 o'clock position 3 cm from the nipple. It measures 0.9 x 0.5 x 0.9 cm. A questioned, more vague but similar appearing mass is demonstrated at the 11 o'clock position 3 cm from the nipple. It measures 6 x 5 x 5 mm. A multi-cystic mass is seen originating from the primary mass and extending to the level of the nipple at the 11 o'clock position 1 cm from the nipple. Overall, it measures 3.0 x 2.1 x 1.3 cm. This may represent a focally dilated duct. Evaluation of the right axilla demonstrates no suspicious lymphadenopathy. IMPRESSION: 1. Highly suspicious right breast masses at the 12 o'clock position 3 cm from the nipple and 1 o'clock position 3 cm from the nipple. Recommend ultrasound-guided  biopsy. 2. Question and third focal mass at the 11 o'clock position 3 cm from the nipple. Additional multi-cystic mass extending to the nipple at the 11 o'clock position 1 cm from the nipple. This may represent a focally dilated duct. 3. No suspicious right axillary lymphadenopathy. 4. No mammographic evidence of malignancy on the left. RECOMMENDATION: 1. Two area ultrasound-guided biopsy of the right breast at the 12 o'clock and 1 o'clock position. 2. Pending biopsy results, consider further evaluation with breast MRI to demonstrate extent of disease. If breast conservation is a consideration, additional findings at the 11 o'clock position 1 and 3 cm from the nipple will need to be biopsied. I have discussed the findings and recommendations with the patient. If applicable, a reminder letter will be sent to the patient regarding the next appointment. BI-RADS CATEGORY  5: Highly suggestive of malignancy. Electronically Signed   By: Sande Brothers M.D.   On: 09/18/2022 16:27      IMPRESSION/PLAN: 1.  Stage IIA, cT2N0M0, grade 3, ER/PR positive invasive lobular carcinoma at 12:00,*** with synchronous Stage IA, cT1bN0M0, grade 2, ER  positive, PR negative invasive lobular carcinoma at 1:00*** of the right breast. Dr. Mitzi Hansen discusses the pathology findings and reviews the nature of *** breast disease. The consensus from the breast conference includes breast conservation with lumpectomy with *** sentinel node biopsy. Depending on the size of the final tumor measurements rendered by pathology, the tumor may be tested for Oncotype Dx score  to determine a role for systemic therapy. Provided that chemotherapy is not indicated, the patient's course would then be followed by external radiotherapy to the breast  to reduce risks of local recurrence followed by antiestrogen therapy. We discussed the risks, benefits, short, and long term effects of radiotherapy, as well as the curative intent, and the patient is interested in  proceeding. Dr. Mitzi Hansen discusses the delivery and logistics of radiotherapy and anticipates a course of *** weeks of radiotherapy. We will see her back a few weeks after surgery to discuss the simulation process and anticipate we starting radiotherapy about 4-6 weeks after surgery.  2. Possible genetic predisposition to malignancy. The patient is a candidate for genetic testing given *** personal and family history. She will meet with our geneticist today in clinic.   In a visit lasting *** minutes, greater than 50% of the time was spent face to face reviewing her case, as well as in preparation of, discussing, and coordinating the patient's care.  The above documentation reflects my direct findings during this shared patient visit. Please see the separate note by Dr. Mitzi Hansen on this date for the remainder of the patient's plan of care.    Osker Mason, [facility]    **Disclaimer: This note was dictated with voice recognition software. Similar sounding words can inadvertently be transcribed and this note may contain transcription errors which may not have been corrected upon publication of note.**

## 2022-09-30 ENCOUNTER — Other Ambulatory Visit: Payer: Self-pay | Admitting: General Surgery

## 2022-09-30 ENCOUNTER — Inpatient Hospital Stay (HOSPITAL_BASED_OUTPATIENT_CLINIC_OR_DEPARTMENT_OTHER): Payer: Medicare PPO | Admitting: Genetic Counselor

## 2022-09-30 ENCOUNTER — Inpatient Hospital Stay: Payer: Medicare PPO | Admitting: Licensed Clinical Social Worker

## 2022-09-30 ENCOUNTER — Ambulatory Visit
Admission: RE | Admit: 2022-09-30 | Discharge: 2022-09-30 | Disposition: A | Discharge status: Home / Self Care | Payer: Medicare PPO | Source: Ambulatory Visit | Attending: Radiation Oncology | Admitting: Radiation Oncology

## 2022-09-30 ENCOUNTER — Inpatient Hospital Stay (HOSPITAL_BASED_OUTPATIENT_CLINIC_OR_DEPARTMENT_OTHER): Payer: Medicare PPO | Admitting: Hematology and Oncology

## 2022-09-30 ENCOUNTER — Ambulatory Visit: Payer: Medicare PPO | Attending: General Surgery | Admitting: Physical Therapy

## 2022-09-30 ENCOUNTER — Inpatient Hospital Stay: Payer: Medicare PPO | Attending: Hematology and Oncology

## 2022-09-30 ENCOUNTER — Encounter: Payer: Self-pay | Admitting: Physical Therapy

## 2022-09-30 ENCOUNTER — Other Ambulatory Visit: Payer: Self-pay

## 2022-09-30 ENCOUNTER — Encounter: Payer: Self-pay | Admitting: Hematology and Oncology

## 2022-09-30 VITALS — BP 136/75 | HR 64 | Temp 97.7°F | Resp 18 | Ht 63.0 in | Wt 151.2 lb

## 2022-09-30 DIAGNOSIS — C50811 Malignant neoplasm of overlapping sites of right female breast: Secondary | ICD-10-CM | POA: Insufficient documentation

## 2022-09-30 DIAGNOSIS — Z803 Family history of malignant neoplasm of breast: Secondary | ICD-10-CM

## 2022-09-30 DIAGNOSIS — Z7983 Long term (current) use of bisphosphonates: Secondary | ICD-10-CM | POA: Diagnosis not present

## 2022-09-30 DIAGNOSIS — Z170 Estrogen receptor positive status [ER+]: Secondary | ICD-10-CM | POA: Diagnosis not present

## 2022-09-30 DIAGNOSIS — Z79899 Other long term (current) drug therapy: Secondary | ICD-10-CM | POA: Insufficient documentation

## 2022-09-30 DIAGNOSIS — Z820 Family history of epilepsy and other diseases of the nervous system: Secondary | ICD-10-CM | POA: Diagnosis not present

## 2022-09-30 DIAGNOSIS — N6322 Unspecified lump in the left breast, upper inner quadrant: Secondary | ICD-10-CM | POA: Insufficient documentation

## 2022-09-30 DIAGNOSIS — Z8249 Family history of ischemic heart disease and other diseases of the circulatory system: Secondary | ICD-10-CM

## 2022-09-30 DIAGNOSIS — N6321 Unspecified lump in the left breast, upper outer quadrant: Secondary | ICD-10-CM | POA: Insufficient documentation

## 2022-09-30 DIAGNOSIS — Z818 Family history of other mental and behavioral disorders: Secondary | ICD-10-CM

## 2022-09-30 DIAGNOSIS — Z823 Family history of stroke: Secondary | ICD-10-CM | POA: Diagnosis not present

## 2022-09-30 DIAGNOSIS — G20A1 Parkinson's disease without dyskinesia, without mention of fluctuations: Secondary | ICD-10-CM

## 2022-09-30 DIAGNOSIS — R293 Abnormal posture: Secondary | ICD-10-CM | POA: Insufficient documentation

## 2022-09-30 DIAGNOSIS — Z8349 Family history of other endocrine, nutritional and metabolic diseases: Secondary | ICD-10-CM

## 2022-09-30 DIAGNOSIS — C50211 Malignant neoplasm of upper-inner quadrant of right female breast: Secondary | ICD-10-CM | POA: Insufficient documentation

## 2022-09-30 DIAGNOSIS — Z8262 Family history of osteoporosis: Secondary | ICD-10-CM | POA: Diagnosis not present

## 2022-09-30 DIAGNOSIS — H9313 Tinnitus, bilateral: Secondary | ICD-10-CM | POA: Insufficient documentation

## 2022-09-30 DIAGNOSIS — Z8042 Family history of malignant neoplasm of prostate: Secondary | ICD-10-CM

## 2022-09-30 LAB — CBC WITH DIFFERENTIAL (CANCER CENTER ONLY)
Abs Immature Granulocytes: 0.02 10*3/uL (ref 0.00–0.07)
Basophils Absolute: 0.1 10*3/uL (ref 0.0–0.1)
Basophils Relative: 1 %
Eosinophils Absolute: 0.2 10*3/uL (ref 0.0–0.5)
Eosinophils Relative: 2 %
HCT: 42.3 % (ref 36.0–46.0)
Hemoglobin: 14.4 g/dL (ref 12.0–15.0)
Immature Granulocytes: 0 %
Lymphocytes Relative: 19 %
Lymphs Abs: 1.4 10*3/uL (ref 0.7–4.0)
MCH: 30.6 pg (ref 26.0–34.0)
MCHC: 34 g/dL (ref 30.0–36.0)
MCV: 90 fL (ref 80.0–100.0)
Monocytes Absolute: 0.7 10*3/uL (ref 0.1–1.0)
Monocytes Relative: 10 %
Neutro Abs: 5 10*3/uL (ref 1.7–7.7)
Neutrophils Relative %: 68 %
Platelet Count: 155 10*3/uL (ref 150–400)
RBC: 4.7 MIL/uL (ref 3.87–5.11)
RDW: 13.1 % (ref 11.5–15.5)
WBC Count: 7.4 10*3/uL (ref 4.0–10.5)
nRBC: 0 % (ref 0.0–0.2)

## 2022-09-30 LAB — CMP (CANCER CENTER ONLY)
ALT: <5 U/L (ref 0–44)
AST: 15 U/L (ref 15–41)
Albumin: 4.3 g/dL (ref 3.5–5.0)
Alkaline Phosphatase: 42 U/L (ref 38–126)
Anion gap: 5 (ref 5–15)
BUN: 16 mg/dL (ref 8–23)
CO2: 28 mmol/L (ref 22–32)
Calcium: 9.4 mg/dL (ref 8.9–10.3)
Chloride: 103 mmol/L (ref 98–111)
Creatinine: 0.67 mg/dL (ref 0.44–1.00)
GFR, Estimated: >60 mL/min (ref >60)
Glucose, Bld: 105 mg/dL — ABNORMAL HIGH (ref 70–99)
Potassium: 4.4 mmol/L (ref 3.5–5.1)
Sodium: 136 mmol/L (ref 135–145)
Total Bilirubin: 0.8 mg/dL (ref 0.3–1.2)
Total Protein: 6.9 g/dL (ref 6.5–8.1)

## 2022-09-30 LAB — GENETIC SCREENING ORDER

## 2022-09-30 NOTE — Progress Notes (Signed)
[facility]cer Center CONSULT NOTE  Patient Care Team: Lorenda Ishihara, MD as PCP - General (Internal Medicine) Pershing Proud, RN as Oncology Nurse Navigator Donnelly Angelica, RN as Oncology Nurse Navigator Rachel Moulds, MD as Consulting Physician (Hematology and Oncology) [name], MD as Consulting Physician (General Surgery) Dorothy Puffer, MD as Consulting Physician (Radiation Oncology)  CHIEF COMPLAINTS/PURPOSE OF CONSULTATION:  New diagnosis of breast cancer  ASSESSMENT & PLAN:  This is a very pleasant 82 year old female patient with past medical history significant for diverticulitis and parkinsonism referred to breast [facility] for additional recommendations given new diagnosis of right-sided breast cancer.  She felt new onset nipple inversion in early 2024 and seek to medical attention because of this.  She has chronic left-sided nipple inversion, breast-feeding injury.  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  We have discussed about the pathology which showed invasive lobular carcinoma, the biopsy sites show grade 2 and grade 3 invasive lobular carcinoma respectively.  Both of these areas are estrogen positive and HER2 negative.  We have discussed about considering MRI for further evaluation given the lobular histology and likely larger tumor based on mammographic findings.  Given her age and her ongoing comorbidities, I do not believe she will be a good candidate for aggressive chemotherapy.  We have discussed about Oncotype Dx, patient wants to think if she even wants to pursue this Oncotype DX testing.  She tells me that she may not be strong enough to tolerate chemotherapy although she is mentally tough. In case she decides to not proceed with Oncotype DX, we have discussed about considering antiestrogen therapy for 10 years.  I have reviewed mechanism of action of aromatase inhibitors, adverse effects including but not limited to postmenopausal  symptoms such as hot flashes, vaginal dryness, arthralgias, bone density loss etc.  She is already on Fosamax weekly for osteopenia/osteoporosis.  All her questions were answered to the best my knowledge.  Thank you for consulting [place] in the care of this patient.  Please not hesitate to contact us with any additional questions or concerns.   HISTORY OF PRESENTING ILLNESS:  Jodi Medina 82 y.o. female is here because of new diagnosis of breast cancer  This is a very pleasant 82 year old female patient with new diagnosis of right breast cancer.  Oncology History  Malignant neoplasm of overlapping sites of right female breast (HCC)  09/15/2022 Mammogram   Patient had bilateral diagnostic mammogram given palpable lump and associated right nipple inversion for several months.  This showed highly suspicious right breast mass at 12 o'clock position 3 cm from the nipple and 1 o'clock position 3 cm from the nipple.  Recommend ultrasound-guided biopsy.  There is a questionable third focal mass at 11:00 3 cm from the nipple.  Additional multicystic mass extending to the nipple at 11 o'clock position 1 cm from the nipple which may represent a focally dilated duct.  No suspicious right axillary lymphadenopathy.  No mammographic evidence of malignancy on the left.   09/22/2022 Pathology Results   Right breast needle core biopsy showed invasive mammary carcinoma, identified as lobular based on loss of E-cadherin, the 1:00 mass 3 cm from the nipple is grade 2 ER/PR positive HER2 negative Ki-67 of 20%.  The right breast needle core biopsy at 12:00 3 cm from the nipple also identified as invasive lobular, high-grade ER positive PR negative HER2 negative with Ki-67 of 5%   09/28/2022 Initial Diagnosis   Malignant neoplasm of overlapping sites  of right female breast (HCC)   09/28/2022 Cancer Staging   Staging form: Breast, AJCC 8th Edition - Clinical stage from 09/28/2022: Stage IIA (cT2, cN0, cM0, G3, ER+, PR+, HER2-)  - Signed by Ronny Bacon, PA-C on 09/28/2022 Stage prefix: Initial diagnosis Method of lymph node assessment: Clinical Histologic grading system: 3 grade system    Patient arrived to the appointment today to the breast [facility].  Her past medical history significant for diverticulitis, parkinsonism and related tremor.  She is a retired Soil scientist.  She has 3 kids, her oldest Ms. Herbert Seta is here with her today along with her husband.  At home she walks her dog twice a day, maybe about a mile a day.  She tells me that after her knee replacement when she was prescribed anti-inflammatories, she had real trouble healing with major diverticulitis related issues. Rest of the pertinent 10 point ROS reviewed and negative  REVIEW OF SYSTEMS:   Constitutional: Denies fevers, chills or abnormal night sweats Eyes: Denies blurriness of vision, double vision or watery eyes Ears, nose, mouth, throat, and face: Denies mucositis or sore throat Respiratory: Denies cough, dyspnea or wheezes Cardiovascular: Denies palpitation, chest discomfort or lower extremity swelling Gastrointestinal:  Denies nausea, heartburn or change in bowel habits Skin: Denies abnormal skin rashes Lymphatics: Denies new lymphadenopathy or easy bruising Neurological:Denies numbness, tingling or new weaknesses Behavioral/Psych: Mood is stable, no new changes  All other systems were reviewed with the patient and are negative.  MEDICAL HISTORY:  Past Medical History:  Diagnosis Date   Diverticulitis    High cholesterol    High triglycerides    Lactose intolerance    Skin cancer    Torn meniscus    bilateral   Tremor     SURGICAL HISTORY: Past Surgical History:  Procedure Laterality Date   BREAST BIOPSY Right 09/22/2022   [place] RT BREAST BX W LOC DEV 1ST LESION IMG BX SPEC US GUIDE 09/22/2022 [facility]   BREAST BIOPSY Right 09/22/2022   [place] RT BREAST BX W LOC DEV EA ADD LESION IMG BX SPEC US GUIDE  09/22/2022 [facility]   BREAST EXCISIONAL BIOPSY Left 1990   DENTAL SURGERY  2017   INJECTION KNEE Right 2020   "gel"   microscopic knee surgery Right 12/2017   for torn meniscus   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   skin cancer removal  1990s   TONSILLECTOMY     as a child   WISDOM TOOTH EXTRACTION      SOCIAL HISTORY: Social History   Socioeconomic History   Marital status: Married    Spouse name: Not on file   Number of children: 3   Years of education: Not on file   Highest education level: Master's degree (e.g., MA, MS, MEng, MEd, MSW, MBA)  Occupational History   Not on file  Tobacco Use   Smoking status: Never   Smokeless tobacco: Never  Vaping Use   Vaping Use: Never used  Substance and Sexual Activity   Alcohol use: Yes    Comment: occ   Drug use: No   Sexual activity: Not on file  Other Topics Concern   Not on file  Social History Narrative   Married, lives at home with husband   Daughter in [place]- 1 grandson   Daughter in [place]- no children   Son- IT works on Community education officer, travel in an RV, lives in [employer] degree  Retired Therapist, sports.      Right handed   Caffeine: 1 cup decaf coffee or herbal tea in the mornings; 2 cups daily in the winter of decaf.   Social Determinants of Health   Financial Resource Strain: Not on file  Food Insecurity: Not on file  Transportation Needs: Not on file  Physical Activity: Not on file  Stress: Not on file  Social Connections: Not on file  Intimate Partner Violence: Not on file    FAMILY HISTORY: Family History  Problem Relation Age of Onset   Hyperlipidemia Mother    Tremor Mother    Osteoporosis Mother    Dementia Mother    Heart failure Mother    Alzheimer's disease Father    Cancer Maternal Aunt        breast   Breast cancer Maternal Aunt    Stroke Maternal Grandmother    Heart disease Maternal Uncle    Heart disease Paternal Uncle      ALLERGIES:  is allergic to lactose intolerance (gi).  MEDICATIONS:  Current Outpatient Medications  Medication Sig Dispense Refill   alendronate (FOSAMAX) 70 MG/75ML solution Take 70 mg by mouth every 7 (seven) days. Take with a full glass of water on an empty stomach.     aspirin EC 81 MG tablet Take 81 mg by mouth daily. Swallow whole.     atorvastatin (LIPITOR) 10 MG tablet Take 5 mg by mouth daily.      carbidopa-levodopa (SINEMET IR) 25-100 MG tablet Take 1 tablet by mouth 3 (three) times daily.     diclofenac Sodium (VOLTAREN) 1 % GEL Apply 4 g topically 4 (four) times daily. 100 g 0   Probiotic Product (ALIGN PO) Take 1 tablet by mouth daily.     propranolol (INDERAL) 10 MG tablet TAKE 1 TABLET(10 MG) BY MOUTH THREE TIMES DAILY AS NEEDED 270 tablet 4   B Complex-Folic Acid (B COMPLEX-VITAMIN B12 PO) Take 1 tablet by mouth daily.     gabapentin (NEURONTIN) 100 MG capsule TAKE 1 CAPSULE(100 MG) BY MOUTH THREE TIMES DAILY 90 capsule 6   gabapentin (NEURONTIN) 100 MG capsule Take 1 capsule (100 mg total) by mouth 3 (three) times daily. 30 capsule 0   morphine (MSIR) 15 MG tablet Take 0.5 tablets (7.5 mg total) by mouth every 4 (four) hours as needed for severe pain. 7 tablet 0   No current facility-administered medications for this visit.     PHYSICAL EXAMINATION: ECOG PERFORMANCE STATUS: 0 - Asymptomatic  Vitals:   09/30/22 0850  BP: 136/75  Pulse: 64  Resp: 18  Temp: 97.7 F (36.5 C)  SpO2: 97%   Filed Weights   09/30/22 0850  Weight: 151 lb 3.2 oz (68.6 kg)   GENERAL:alert, no distress and comfortable Both breasts inspected and palpated.  Right breast with some postbiopsy changes and a palpable mass in the lower inner quadrant.  No palpable regional adenopathy No lower extremity edema.  Mask face and Parkinson tremor noted  LABORATORY DATA:  I have reviewed the data as listed Lab Results  Component Value Date   WBC 7.4 09/30/2022   HGB 14.4 09/30/2022   HCT  42.3 09/30/2022   MCV 90.0 09/30/2022   PLT 155 09/30/2022     Chemistry      Component Value Date/Time   NA 136 09/30/2022 0825   K 4.4 09/30/2022 0825   CL 103 09/30/2022 0825   CO2 28 09/30/2022 0825   BUN 16 09/30/2022  0825   CREATININE 0.67 09/30/2022 0825      Component Value Date/Time   CALCIUM 9.4 09/30/2022 0825   ALKPHOS 42 09/30/2022 0825   AST 15 09/30/2022 0825   ALT <5 09/30/2022 0825   BILITOT 0.8 09/30/2022 0825       RADIOGRAPHIC STUDIES: I have personally reviewed the radiological images as listed and agreed with the findings in the report. [place] RT BREAST BX W LOC DEV EA ADD LESION IMG BX SPEC US GUIDE  Addendum Date: 09/24/2022   ADDENDUM REPORT: 09/24/2022 08:53 ADDENDUM: Pathology revealed GRADE II INVASIVE MAMMARY (MORPHOLOGICALLY FAVOR LOBULAR; E-CADHERIN PENDING) CARCINOMA, CALCIFICATIONS: PRESENT of the RIGHT breast, 1:00 o'clock, 3 cmfn, (wing clip). This was found to be concordant by Dr. Bary Richard. Pathology revealed GRADE III INVASIVE MAMMARY (E-CADHERIN PENDING) CARCINOMA of the RIGHT breast, 12:00 o'clock, 3 cmfn, (coil clip). This was found to be concordant by Dr. Bary Richard. Pathology results were discussed with the patient by telephone. The patient reported doing well after the biopsy with tenderness at the site. Post biopsy instructions and care were reviewed and questions were answered. The patient was encouraged to call [facility] for any additional concerns. My direct phone number was provided. The patient was referred to [facility] on Sep 30, 2022. NOTE: If breast conservation is a consideration, additional findings at the 11 o'clock position 1 and 3 cm from the nipple will need to be biopsied. Consider further evaluation with breast MRI to demonstrate extent of disease. Pathology results reported by Rene Kocher, RN on 09/23/2022. Electronically  Signed   By: Bary Richard M.D.   On: 09/24/2022 08:53   Result Date: 09/24/2022 CLINICAL DATA:  Patient presents today for ultrasound-guided core biopsies of highly suspicious masses within the RIGHT breast at the 1 o'clock axis, 3 cm from the nipple and at the 12 o'clock axis, 3 cm from the nipple. EXAM: ULTRASOUND GUIDED RIGHT BREAST CORE NEEDLE BIOPSY x2 COMPARISON:  Previous exam(s). PROCEDURE: I met with the patient and we discussed the procedure of ultrasound-guided biopsy, including benefits and alternatives. We discussed the high likelihood of a successful procedure. We discussed the risks of the procedure, including infection, bleeding, tissue injury, clip migration, and inadequate sampling. Informed written consent was given. The usual time-out protocol was performed immediately prior to the procedure. Site 1: Lesion quadrant: Upper inner quadrant Using sterile technique and 1% Lidocaine as local anesthetic, under direct ultrasound visualization, a 12 gauge spring-loaded device was used to perform biopsy of the RIGHT breast mass at the 1 o'clock axis, 3 cm from the nipple, using a inferomedial approach. At the conclusion of the procedure , a wing shaped tissue marker clip was deployed into the biopsy cavity. Site 2: Lesion quadrant: 12 o'clock Using sterile technique and 1% Lidocaine as local anesthetic, under direct ultrasound visualization, a 12 gauge spring-loaded device was used to perform biopsy of the RIGHT breast mass at the 12 o'clock axis, 3 cm from the nipple, using a inferomedial approach. At the conclusion of the procedure , a coil shaped tissue marker clip was deployed into the biopsy cavity. Follow up 2 view mammogram was performed and dictated separately. IMPRESSION: 1. Ultrasound guided biopsy of the RIGHT breast mass at the 1 o'clock axis, 3 cm from the nipple (wing shaped clip). No apparent complications. 2. Ultrasound guided biopsy of the RIGHT breast mass at the 12 o'clock axis, 3 cm  from the nipple (coil shaped clip). No apparent complications. Electronically Signed: By: Bary Richard M.D. On: 09/22/2022 12:11  [place] RT BREAST BX W LOC DEV 1ST LESION IMG BX SPEC US GUIDE  Addendum Date: 09/24/2022   ADDENDUM REPORT: 09/24/2022 08:53 ADDENDUM: Pathology revealed GRADE II INVASIVE MAMMARY (MORPHOLOGICALLY FAVOR LOBULAR; E-CADHERIN PENDING) CARCINOMA, CALCIFICATIONS: PRESENT of the RIGHT breast, 1:00 o'clock, 3 cmfn, (wing clip). This was found to be concordant by Dr. Bary Richard. Pathology revealed GRADE III INVASIVE MAMMARY (E-CADHERIN PENDING) CARCINOMA of the RIGHT breast, 12:00 o'clock, 3 cmfn, (coil clip). This was found to be concordant by Dr. Bary Richard. Pathology results were discussed with the patient by telephone. The patient reported doing well after the biopsy with tenderness at the site. Post biopsy instructions and care were reviewed and questions were answered. The patient was encouraged to call [facility] for any additional concerns. My direct phone number was provided. The patient was referred to [facility] on Sep 30, 2022. NOTE: If breast conservation is a consideration, additional findings at the 11 o'clock position 1 and 3 cm from the nipple will need to be biopsied. Consider further evaluation with breast MRI to demonstrate extent of disease. Pathology results reported by Rene Kocher, RN on 09/23/2022. Electronically Signed   By: Bary Richard M.D.   On: 09/24/2022 08:53   Result Date: 09/24/2022 CLINICAL DATA:  Patient presents today for ultrasound-guided core biopsies of highly suspicious masses within the RIGHT breast at the 1 o'clock axis, 3 cm from the nipple and at the 12 o'clock axis, 3 cm from the nipple. EXAM: ULTRASOUND GUIDED RIGHT BREAST CORE NEEDLE BIOPSY x2 COMPARISON:  Previous exam(s). PROCEDURE: I met with the patient and we discussed the procedure of  ultrasound-guided biopsy, including benefits and alternatives. We discussed the high likelihood of a successful procedure. We discussed the risks of the procedure, including infection, bleeding, tissue injury, clip migration, and inadequate sampling. Informed written consent was given. The usual time-out protocol was performed immediately prior to the procedure. Site 1: Lesion quadrant: Upper inner quadrant Using sterile technique and 1% Lidocaine as local anesthetic, under direct ultrasound visualization, a 12 gauge spring-loaded device was used to perform biopsy of the RIGHT breast mass at the 1 o'clock axis, 3 cm from the nipple, using a inferomedial approach. At the conclusion of the procedure , a wing shaped tissue marker clip was deployed into the biopsy cavity. Site 2: Lesion quadrant: 12 o'clock Using sterile technique and 1% Lidocaine as local anesthetic, under direct ultrasound visualization, a 12 gauge spring-loaded device was used to perform biopsy of the RIGHT breast mass at the 12 o'clock axis, 3 cm from the nipple, using a inferomedial approach. At the conclusion of the procedure , a coil shaped tissue marker clip was deployed into the biopsy cavity. Follow up 2 view mammogram was performed and dictated separately. IMPRESSION: 1. Ultrasound guided biopsy of the RIGHT breast mass at the 1 o'clock axis, 3 cm from the nipple (wing shaped clip). No apparent complications. 2. Ultrasound guided biopsy of the RIGHT breast mass at the 12 o'clock axis, 3 cm from the nipple (coil shaped clip). No apparent complications. Electronically Signed: By: Bary Richard M.D. On: 09/22/2022 12:11  MM CLIP PLACEMENT RIGHT  Result Date: 09/22/2022 CLINICAL DATA:  Status post ultrasound-guided biopsies for masses in the RIGHT breast at the 1 o'clock axis and 12 o'clock axis. EXAM:  3D DIAGNOSTIC RIGHT MAMMOGRAM POST ULTRASOUND BIOPSY x2 COMPARISON:  Previous exam(s). FINDINGS: 3D Mammographic images were obtained  following ultrasound guided biopsy of a RIGHT breast mass at the 1 o'clock axis, 3 cm from nipple and then a RIGHT breast mass at the 12 o'clock axis, 3 cm from the nipple. The biopsy marking clips are expected position at the sites of biopsy. IMPRESSION: 1. Appropriate positioning of the wing shaped biopsy marking clip at the site of biopsy in the upper inner quadrant of the RIGHT breast corresponding to the targeted mass at the 1 o'clock axis. 2. Appropriate positioning of the coil shaped biopsy marking clip at the site of biopsy in the upper RIGHT breast corresponding to the targeted mass at the 12 o'clock axis. Final Assessment: Post Procedure Mammograms for Marker Placement Electronically Signed   By: Bary Richard M.D.   On: 09/22/2022 12:25  MM 3D DIAGNOSTIC MAMMOGRAM BILATERAL BREAST  Result Date: 09/18/2022 CLINICAL DATA:  Right nipple inversion for several months with an associated palpable. EXAM: DIGITAL DIAGNOSTIC BILATERAL MAMMOGRAM WITH TOMOSYNTHESIS; ULTRASOUND RIGHT BREAST LIMITED TECHNIQUE: Bilateral digital diagnostic mammography and breast tomosynthesis was performed.; Targeted ultrasound examination of the right breast was performed COMPARISON:  Previous exam(s). ACR Breast Density Category c: The breasts are heterogeneously dense, which may obscure small masses. FINDINGS: A radiopaque BB was placed at the site of the patient's right breast palpable lump. A large, hyperdense spiculated mass is seen just deep radiopaque BB. A similar appearing mass is seen slightly more posterior and medial. There is overlying skin retraction and inversion of the nipple. No mammographic evidence of malignancy within the left breast. Targeted ultrasound is performed, showing an irregular, hypoechoic mass at the 12 o'clock position 3 cm from the nipple. It measures 2.1 x 2.0 x 1.5 cm with peripheral vascularity. A similar appearing mass is demonstrated at the 1 o'clock position 3 cm from the nipple. It measures  0.9 x 0.5 x 0.9 cm. A questioned, more vague but similar appearing mass is demonstrated at the 11 o'clock position 3 cm from the nipple. It measures 6 x 5 x 5 mm. A multi-cystic mass is seen originating from the primary mass and extending to the level of the nipple at the 11 o'clock position 1 cm from the nipple. Overall, it measures 3.0 x 2.1 x 1.3 cm. This may represent a focally dilated duct. Evaluation of the right axilla demonstrates no suspicious lymphadenopathy. IMPRESSION: 1. Highly suspicious right breast masses at the 12 o'clock position 3 cm from the nipple and 1 o'clock position 3 cm from the nipple. Recommend ultrasound-guided biopsy. 2. Question and third focal mass at the 11 o'clock position 3 cm from the nipple. Additional multi-cystic mass extending to the nipple at the 11 o'clock position 1 cm from the nipple. This may represent a focally dilated duct. 3. No suspicious right axillary lymphadenopathy. 4. No mammographic evidence of malignancy on the left. RECOMMENDATION: 1. Two area ultrasound-guided biopsy of the right breast at the 12 o'clock and 1 o'clock position. 2. Pending biopsy results, consider further evaluation with breast MRI to demonstrate extent of disease. If breast conservation is a consideration, additional findings at the 11 o'clock position 1 and 3 cm from the nipple will need to be biopsied. I have discussed the findings and recommendations with the patient. If applicable, a reminder letter will be sent to the patient regarding the next appointment. BI-RADS CATEGORY  5: Highly suggestive of malignancy. Electronically Signed   By: Casimer Bilis  Marisa Sprinkles M.D.   On: 09/18/2022 16:27  [place] LIMITED ULTRASOUND INCLUDING AXILLA RIGHT BREAST  Result Date: 09/18/2022 CLINICAL DATA:  Right nipple inversion for several months with an associated palpable. EXAM: DIGITAL DIAGNOSTIC BILATERAL MAMMOGRAM WITH TOMOSYNTHESIS; ULTRASOUND RIGHT BREAST LIMITED TECHNIQUE: Bilateral digital diagnostic  mammography and breast tomosynthesis was performed.; Targeted ultrasound examination of the right breast was performed COMPARISON:  Previous exam(s). ACR Breast Density Category c: The breasts are heterogeneously dense, which may obscure small masses. FINDINGS: A radiopaque BB was placed at the site of the patient's right breast palpable lump. A large, hyperdense spiculated mass is seen just deep radiopaque BB. A similar appearing mass is seen slightly more posterior and medial. There is overlying skin retraction and inversion of the nipple. No mammographic evidence of malignancy within the left breast. Targeted ultrasound is performed, showing an irregular, hypoechoic mass at the 12 o'clock position 3 cm from the nipple. It measures 2.1 x 2.0 x 1.5 cm with peripheral vascularity. A similar appearing mass is demonstrated at the 1 o'clock position 3 cm from the nipple. It measures 0.9 x 0.5 x 0.9 cm. A questioned, more vague but similar appearing mass is demonstrated at the 11 o'clock position 3 cm from the nipple. It measures 6 x 5 x 5 mm. A multi-cystic mass is seen originating from the primary mass and extending to the level of the nipple at the 11 o'clock position 1 cm from the nipple. Overall, it measures 3.0 x 2.1 x 1.3 cm. This may represent a focally dilated duct. Evaluation of the right axilla demonstrates no suspicious lymphadenopathy. IMPRESSION: 1. Highly suspicious right breast masses at the 12 o'clock position 3 cm from the nipple and 1 o'clock position 3 cm from the nipple. Recommend ultrasound-guided biopsy. 2. Question and third focal mass at the 11 o'clock position 3 cm from the nipple. Additional multi-cystic mass extending to the nipple at the 11 o'clock position 1 cm from the nipple. This may represent a focally dilated duct. 3. No suspicious right axillary lymphadenopathy. 4. No mammographic evidence of malignancy on the left. RECOMMENDATION: 1. Two area ultrasound-guided biopsy of the right  breast at the 12 o'clock and 1 o'clock position. 2. Pending biopsy results, consider further evaluation with breast MRI to demonstrate extent of disease. If breast conservation is a consideration, additional findings at the 11 o'clock position 1 and 3 cm from the nipple will need to be biopsied. I have discussed the findings and recommendations with the patient. If applicable, a reminder letter will be sent to the patient regarding the next appointment. BI-RADS CATEGORY  5: Highly suggestive of malignancy. Electronically Signed   By: Sande Brothers M.D.   On: 09/18/2022 16:27   All questions were answered. The patient knows to call the clinic with any problems, questions or concerns. I spent 45 minutes in the care of this patient including H and P, review of records, counseling and coordination of care.     Rachel Moulds, MD 09/30/2022 9:14 AM

## 2022-09-30 NOTE — Research (Signed)
Trial:  [employer] 2021-05 - Specimen Collection Study to Evaluate Biomarkers in Subjects with Cancer  Patient Jodi Medina was identified by Dr Al Pimple as a potential candidate for the above listed study.  This Clinical Research Nurse met with Daria Pastures, [id number], on 09/30/22 in a manner and location that ensures patient privacy to discuss participation in the above listed research study.  Patient is Accompanied by her husband and daughter .  A copy of the informed consent document with embedded HIPAA language was provided to the patient.  Patient reads, speaks, and understands [place].   Patient was provided with the business card of this Nurse and encouraged to contact the research team with any questions.  Approximately 10 minutes were spent with the patient reviewing the informed consent documents.  Patient was provided the option of taking informed consent documents home to review and was encouraged to review at their convenience with their support network, including other care providers. Patient took the consent documents home to review. Plan follow up by phone next week.  Margret Chance Atley Neubert, RN, BSN, [facility] She  Her  Hers Clinical Research Nurse [facility] Direct Dial [phone]  Pager [phone] 09/30/2022 12:41 PM

## 2022-09-30 NOTE — Therapy (Signed)
OUTPATIENT PHYSICAL THERAPY BREAST CANCER BASELINE EVALUATION   Patient Name: Jodi Medina MRN: [id number] DOB:1940/09/17, 82 y.o., female Today's Date: 09/30/2022  END OF SESSION:  PT End of Session - 09/30/22 1248     Visit Number 1    Number of Visits 2    Date for PT Re-Evaluation 11/25/22    PT Start Time 1154    PT Stop Time 1222    PT Time Calculation (min) 28 min    Activity Tolerance Patient tolerated treatment well    Behavior During Therapy WFL for tasks assessed/performed             Past Medical History:  Diagnosis Date   Breast cancer (HCC)    Diverticulitis    High cholesterol    High triglycerides    Lactose intolerance    Skin cancer    Torn meniscus    bilateral   Tremor    Past Surgical History:  Procedure Laterality Date   BREAST BIOPSY Right 09/22/2022   [place] RT BREAST BX W LOC DEV 1ST LESION IMG BX SPEC US GUIDE 09/22/2022 [facility]   BREAST BIOPSY Right 09/22/2022   [place] RT BREAST BX W LOC DEV EA ADD LESION IMG BX SPEC US GUIDE 09/22/2022 [facility]   BREAST EXCISIONAL BIOPSY Left 1990   DENTAL SURGERY  2017   INJECTION KNEE Right 2020   "gel"   microscopic knee surgery Right 12/2017   for torn meniscus   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 [name]  REFERRING DIAG: Right breast cancer  THERAPY DIAG:  Malignant neoplasm of upper-inner quadrant of right breast in female, estrogen receptor positive  (HCC)  Abnormal posture  Rationale for Evaluation and Treatment: Rehabilitation  ONSET DATE: 09/18/2022  SUBJECTIVE:                                                                                                                                                                                           SUBJECTIVE STATEMENT: Patient reports she is here today to be seen by her  medical team for her newly diagnosed right breast cancer.   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 It measures 2.1 cm and 9 mm and is located in the upper inner quadrant. It is ER positive in 1 mass and ER negative in another mass. Both areas are PR positive and HER2 negative with a Ki67 of 5-20%. She has Parkinson's and a deep brain stimulator for reducing tremors. She also has a hx of cerebellar CVA.  PATIENT GOALS:   reduce lymphedema risk and learn post op HEP.   PAIN:  Are you having pain? No  PRECAUTIONS: Active CA   HAND DOMINANCE: right  WEIGHT BEARING RESTRICTIONS: No  FALLS:  Has patient fallen in last 6 months? No  LIVING ENVIRONMENT: Patient lives with: her husband Lives in: House/apartment Has following equipment at home: None  OCCUPATION: Retired  LEISURE: She walks twice a day to take her dog for walks  PRIOR LEVEL OF FUNCTION: Independent   OBJECTIVE:  COGNITION: Overall cognitive status: Within functional limits for tasks assessed    POSTURE:  Forward head and rounded shoulders posture  UPPER EXTREMITY AROM/PROM:  A/PROM RIGHT   eval   Shoulder extension 46  Shoulder flexion 135  Shoulder abduction 165  Shoulder internal rotation 71  Shoulder external rotation 79    (Blank rows = not tested)  A/PROM LEFT   eval  Shoulder extension 36  Shoulder flexion 143  Shoulder abduction 178  Shoulder internal rotation 75  Shoulder external rotation 85    (Blank rows = not tested)  CERVICAL AROM: All  within normal limits  UPPER EXTREMITY STRENGTH: WFL   LYMPHEDEMA ASSESSMENTS:   LANDMARK RIGHT   eval  10 cm proximal to olecranon process 27  Olecranon process 24.6  10 cm proximal to ulnar styloid process 22.7  Just proximal to ulnar styloid process 16.2  Across hand at thumb web space 17.3  At base of 2nd digit 5.9  (Blank rows = not tested)  LANDMARK LEFT   eval  10 cm proximal to olecranon process 26.5  Olecranon process 24.9  10 cm proximal to ulnar styloid process 22.5  Just proximal to ulnar styloid process 15.8  Across hand at thumb web space 16.9  At base of 2nd digit 5.9  (Blank rows = not tested)  L-DEX LYMPHEDEMA SCREENING:  The patient was assessed using the L-Dex machine today to produce a lymphedema index baseline score. The patient will be reassessed on a regular basis (typically every 3 months) to obtain new L-Dex scores. If the score is > 6.5 points away from his/her baseline score indicating onset of subclinical lymphedema, it will be recommended to wear a compression garment for 4 weeks, 12 hours per day and then be reassessed. If the score continues to be > 6.5 points from baseline at reassessment, we will initiate lymphedema treatment. Assessing in this manner has a 95% rate of preventing clinically significant lymphedema.   L-DEX FLOWSHEETS - 09/30/22 1200       L-DEX LYMPHEDEMA SCREENING   Measurement Type --   Unable to do as pt has a brain stimulator            QUICK DASH SURVEY:  Neldon Mc - 09/30/22 0001     Open a tight or new jar Mild difficulty    Do heavy household chores (wash walls, wash floors) Mild difficulty    Carry a shopping bag or briefcase No difficulty  Wash your back Moderate difficulty    Use a knife to cut food Moderate difficulty    Recreational activities in which you take some force or impact through your arm, shoulder, or hand (golf, hammering, tennis) Unable    During the past week, to what extent has your arm,  shoulder or hand problem interfered with your normal social activities with family, friends, neighbors, or groups? Not at all    During the past week, to what extent has your arm, shoulder or hand problem limited your work or other regular daily activities Not at all    Arm, shoulder, or hand pain. None    Tingling (pins and needles) in your arm, shoulder, or hand None    Difficulty Sleeping No difficulty    DASH Score 22.73 %              PATIENT EDUCATION:  Education details: Lymphedema risk reduction and post op shoulder/posture HEP Person educated: Patient Education method: Explanation, Demonstration, Handout Education comprehension: Patient verbalized understanding and returned demonstration  HOME EXERCISE PROGRAM: Patient was instructed today in a home exercise program today for post op shoulder range of motion. These included active assist shoulder flexion in sitting, scapular retraction, wall walking with shoulder abduction, and hands behind head external rotation.  She was encouraged to do these twice a day, holding 3 seconds and repeating 5 times when permitted by her physician.   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 It measures 2.1 cm and 9 mm and is located in the upper inner quadrant. It is ER positive in 1 mass and ER negative in another mass. Both areas are PR positive and HER2 negative with a Ki67 of 5-20%. She has Parkinson's and a deep brain stimulator for reducing tremors. She also has a hx of cerebellar CVA.Her multidisciplinary medical team met prior to her assessments to determine a recommended treatment plan. She is planning to have a right mastectomy and sentinel node biopsy followed by Oncotype testing and anti-estrogen therapy. She will benefit from a post op PT reassessment to determine needs and from L-Dex screens every 3 months for 2 years to detect subclinical  lymphedema.  Pt will benefit from skilled therapeutic intervention to improve on the following deficits: Decreased knowledge of precautions, impaired UE functional use, pain, decreased ROM, postural dysfunction.   PT treatment/interventions: ADL/self-care home management, pt/family education, therapeutic exercise  REHAB POTENTIAL: Excellent  CLINICAL DECISION MAKING: Stable/uncomplicated  EVALUATION COMPLEXITY: Low   GOALS: Goals reviewed with patient? YES  LONG TERM GOALS: (STG=LTG)    Name Target Date Goal status  1 Pt will be able to verbalize understanding of pertinent lymphedema risk reduction practices relevant to her dx specifically related to skin care.  Baseline:  No knowledge 09/30/2022 Achieved at eval  2 Pt will be able to return demo and/or verbalize understanding of the post op HEP related to regaining shoulder ROM. Baseline:  No knowledge 09/30/2022 Achieved at eval  3 Pt will be able to verbalize understanding of the importance of attending the post op After Breast CA Class for further lymphedema risk reduction education and therapeutic exercise.  Baseline:  No knowledge 09/30/2022 Achieved at eval  4 Pt will demo she has regained full shoulder ROM and function post operatively compared to baselines.  Baseline: See objective measurements taken today. 11/25/2022     PLAN:  PT FREQUENCY/DURATION: EVAL and 1 follow up appointment.   PLAN FOR  NEXT SESSION: will reassess 3-4 weeks post op to determine needs.   Patient will follow up at outpatient cancer rehab 3-4 weeks following surgery.  If the patient requires physical therapy at that time, a specific plan will be dictated and sent to the referring physician for approval. The patient was educated today on appropriate basic range of motion exercises to begin post operatively and the importance of attending the After Breast Cancer class following surgery.  Patient was educated today on lymphedema risk reduction practices as it  pertains to recommendations that will benefit the patient immediately following surgery.  She verbalized good understanding.    Physical Therapy Information for After Breast Cancer Surgery/Treatment:  Lymphedema is a swelling condition that you may be at risk for in your arm if you have lymph nodes removed from the armpit area.  After a sentinel node biopsy, the risk is approximately 5-9% and is higher after an axillary node dissection.  There is treatment available for this condition and it is not life-threatening.  Contact your physician or physical therapist with concerns. You may begin the 4 shoulder/posture exercises (see additional sheet) when permitted by your physician (typically a week after surgery).  If you have drains, you may need to wait until those are removed before beginning range of motion exercises.  A general recommendation is to not lift your arms above shoulder height until drains are removed.  These exercises should be done to your tolerance and gently.  This is not a "no pain/no gain" type of recovery so listen to your body and stretch into the range of motion that you can tolerate, stopping if you have pain.  If you are having immediate reconstruction, ask your plastic surgeon about doing exercises as he or she may want you to wait. We encourage you to attend the free one time ABC (After Breast Cancer) class offered by [facility].  You will learn information related to lymphedema risk, prevention and treatment and additional exercises to regain mobility following surgery.  You can call [phone] for more information.  This is offered the 1st and 3rd Monday of each month.  You only attend the class one time. While undergoing any medical procedure or treatment, try to avoid blood pressure being taken or needle sticks from occurring on the arm on the side of cancer.   This recommendation begins after surgery and continues for the rest of your life.  This may  help reduce your risk of getting lymphedema (swelling in your arm). An excellent resource for those seeking information on lymphedema is the National Lymphedema Network's web site. It can be accessed at www.lymphnet.org If you notice swelling in your hand, arm or breast at any time following surgery (even if it is many years from now), please contact your doctor or physical therapist to discuss this.  Lymphedema can be treated at any time but it is easier for you if it is treated early on.  If you feel like your shoulder motion is not returning to normal in a reasonable amount of time, please contact your surgeon or physical therapist.  [facility] [phone]. [address], [place] [address]  ABC CLASS After Breast Cancer Class  After Breast Cancer Class is a specially designed exercise class to assist you in a safe recover after having breast cancer surgery.  In this class you will learn how to get back to full function whether your drains were just removed  or if you had surgery a month ago.  This one-time class is held the 1st and 3rd Monday of every month from 11:00 a.m. until 12:00 noon virtually.  This class is FREE and space is limited. For more information or to register for the next available class, call [phone].  Class Goals  Understand specific stretches to improve the flexibility of you chest and shoulder. Learn ways to safely strengthen your upper body and improve your posture. Understand the warning signs of infection and why you may be at risk for an arm infection. Learn about Lymphedema and prevention.  ** You do not attend this class until after surgery.  Drains must be removed to participate  Patient was instructed today in a home exercise program today for post op shoulder range of motion. These included active assist shoulder flexion in sitting, scapular retraction, wall walking with shoulder abduction, and hands  behind head external rotation.  She was encouraged to do these twice a day, holding 3 seconds and repeating 5 times when permitted by her physician.   Bethann Punches, [place] 09/30/22 2:44 PM

## 2022-10-01 ENCOUNTER — Encounter: Payer: Self-pay | Admitting: Genetic Counselor

## 2022-10-01 NOTE — Progress Notes (Signed)
REFERRING PROVIDER: Rachel Moulds, MD [address] [place],  [place] [address]  PRIMARY PROVIDER:  Lorenda Ishihara, MD  PRIMARY REASON FOR VISIT:  1. Malignant neoplasm of overlapping sites of right breast in female, estrogen receptor positive (HCC)   2. Family history of breast cancer   3. Family history of prostate cancer     HISTORY OF PRESENT ILLNESS:   Jodi Medina, a 82 y.o. female, was seen for a Experiment cancer genetics consultation at the request of Dr. Al Pimple due to a personal and family history of breast cancer.  Jodi Medina presents to clinic today to discuss the possibility of a hereditary predisposition to cancer, to discuss genetic testing, and to further clarify her future cancer risks, as well as potential cancer risks for family members.   In April 2024, at the age of 11, Jodi Medina was diagnosed with invasive lobular carcinoma of the right breast. The treatment plan is pending.    CANCER HISTORY:  Oncology History  Malignant neoplasm of overlapping sites of right female breast (HCC)  09/15/2022 Mammogram   Patient had bilateral diagnostic mammogram given palpable lump and associated right nipple inversion for several months.  This showed highly suspicious right breast mass at 12 o'clock position 3 cm from the nipple and 1 o'clock position 3 cm from the nipple.  Recommend ultrasound-guided biopsy.  There is a questionable third focal mass at 11:00 3 cm from the nipple.  Additional multicystic mass extending to the nipple at 11 o'clock position 1 cm from the nipple which may represent a focally dilated duct.  No suspicious right axillary lymphadenopathy.  No mammographic evidence of malignancy on the left.   09/22/2022 Pathology Results   Right breast needle core biopsy showed invasive mammary carcinoma, identified as lobular based on loss of E-cadherin, the 1:00 mass 3 cm from the nipple is grade 2 ER/PR positive HER2 negative Ki-67 of 20%.  The right breast needle core  biopsy at 12:00 3 cm from the nipple also identified as invasive lobular, high-grade ER positive PR negative HER2 negative with Ki-67 of 5%   09/28/2022 Initial Diagnosis   Malignant neoplasm of overlapping sites of right female breast (HCC)   09/28/2022 Cancer Staging   Staging form: Breast, AJCC 8th Edition - Clinical stage from 09/28/2022: Stage IIA (cT2, cN0, cM0, G3, ER+, PR+, HER2-) - Signed by Ronny Bacon, PA-C on 09/28/2022 Stage prefix: Initial diagnosis Method of lymph node assessment: Clinical Histologic grading system: 3 grade system     Past Medical History:  Diagnosis Date   Breast cancer (HCC)    Diverticulitis    High cholesterol    High triglycerides    Lactose intolerance    Skin cancer    Torn meniscus    bilateral   Tremor     Past Surgical History:  Procedure Laterality Date   BREAST BIOPSY Right 09/22/2022   [place] RT BREAST BX W LOC DEV 1ST LESION IMG BX SPEC US GUIDE 09/22/2022 [facility]   BREAST BIOPSY Right 09/22/2022   [place] RT BREAST BX W LOC DEV EA ADD LESION IMG BX SPEC US GUIDE 09/22/2022 [facility]   BREAST EXCISIONAL BIOPSY Left 1990   DENTAL SURGERY  2017   INJECTION KNEE Right 2020   "gel"   microscopic knee surgery Right 12/2017   for torn meniscus   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  Significant diagnoses are listed below: Family History  Problem Relation Age of Onset   Prostate cancer Brother        dx >46   Breast cancer Maternal Aunt        dx 73s   Melanoma Daughter        dx 50s; leg; s/p excision   Colon cancer Nephew 55       metastatic     Jodi Medina is unaware of previous family history of genetic testing for hereditary cancer risks.  There is no reported Ashkenazi Jewish ancestry. There is no known consanguinity.  GENETIC  COUNSELING ASSESSMENT: Jodi Medina is a 82 y.o. female with a personal and family history which is somewhat suggestive of a hereditary cancer syndrome and predisposition to cancer given the presence of related cancers in the family (breast, prostate, melanoma, etc). We, therefore, discussed and recommended the following at today's visit.   DISCUSSION: We discussed that 5 - 10% of cancer is hereditary.  Most cases of hereditary breast cancer is associated with mutations in BRCA1/2.  There are other genes that can be associated with hereditary breast, prostate, or melanoma cancer syndromes.  We discussed that testing is beneficial for several reasons including knowing how to follow individuals for their cancer risks and understanding if other family members could be at risk for cancer and allowing them to undergo genetic testing.   We reviewed the characteristics, features and inheritance patterns of hereditary cancer syndromes. We also discussed genetic testing, including the appropriate family members to test, the process of testing, insurance coverage and turn-around-time for results. We discussed the implications of a negative, positive, carrier and/or variant of uncertain significant result. We recommended Jodi Medina pursue genetic testing for a panel that includes genes associated with breast, prostate, melanoma and other cancer genes.    Based on Ms. Tsutsui's personal and family history of breast cancer, she meets medical criteria for genetic testing.   PLAN: Jodi Medina did not wish to pursue genetic testing at today's visit.  She is not overly concerned about a hereditary cancer syndrome at this point given her large family.  She plans to speak with her children before deciding about genetic testing. We understand this decision and remain available to coordinate genetic testing at any time in the future. We, therefore, recommend Jodi Medina continue to follow the cancer screening guidelines given by her  primary healthcare provider.  Jodi Medina questions were answered to her satisfaction today. Our contact information was provided should additional questions or concerns arise. Thank you for the referral and allowing [place] to share in the care of your patient.   Jodi Medina M. Rennie Plowman, MS, [facility] Genetic Counselor [email or web address] (P) [phone]  The patient was seen for a total of 10 minutes in face-to-face genetic counseling.  The was accompanied by her daughter, Herbert Seta, and her husband.  Dr. Al Pimple was available to discuss this case as needed.    _______________________________________________________________________ For Office Staff:  Number of people involved in session: 3 Was an Intern/ student involved with case: no

## 2022-10-05 ENCOUNTER — Telehealth: Payer: Self-pay

## 2022-10-05 NOTE — Telephone Encounter (Signed)
[employer] 2021-05 - Specimen Collection Study to Evaluate Biomarkers in Subjects with Cancer   Called patient to follow up on research study interest following [facility] last week. Patient is having surgery next week and feels like her hands are full. Will not pursue study involvement at this time.  Margret Chance Lucan Riner, RN, BSN, [facility] She  Her  [place]ers Clinical Research Nurse [facility] Direct Dial [phone]  Pager [phone] 10/05/2022 3:03 PM

## 2022-10-05 NOTE — Pre-Procedure Instructions (Signed)
Surgical Instructions    Your procedure is scheduled on Oct 13, 2022.  Report to [facility] Main Entrance "A" at 10:45 A.M., then check in with the Admitting office.  Call this number if you have problems the morning of surgery:  [phone]  If you have any questions prior to your surgery date call [phone]: Open Monday-Friday 8am-4pm If you experience any cold or flu symptoms such as cough, fever, chills, shortness of breath, etc. between now and your scheduled surgery, please notify us at the above number.     Remember:  Do not eat after midnight the night before your surgery  You may drink clear liquids until 9:45 AM the morning of your surgery.   Clear liquids allowed are: Water, Non-Citrus Juices (without pulp), Carbonated Beverages, Clear Tea, Black Coffee Only (NO MILK, CREAM OR POWDERED CREAMER of any kind), and Gatorade.  Patient Instructions  The night before surgery:  No food after midnight. ONLY clear liquids after midnight  The day of surgery (if you do NOT have diabetes):  Drink ONE (1) Pre-Surgery Clear Ensure by 9:45 AM the morning of surgery. Drink in one sitting. Do not sip.  This drink was given to you during your hospital  pre-op appointment visit.  Nothing else to drink after completing the  Pre-Surgery Clear Ensure.         If you have questions, please contact your surgeon's office.     Take these medicines the morning of surgery with A SIP OF WATER:  atorvastatin (LIPITOR)   carbidopa-levodopa (SINEMET IR)   propranolol (INDERAL)   carboxymethylcellulose ophthalmic solution - may take if needed   Follow your surgeon's instructions on when to stop Aspirin.  If no instructions were given by your surgeon then you will need to call the office to get those instructions.     As of today, STOP taking any Aleve, Naproxen, Ibuprofen, Motrin, Advil, Goody's, BC's, all herbal medications, fish oil, and all vitamins. This includes your medication:  diclofenac Sodium (VOLTAREN) GEL                      Do NOT Smoke (Tobacco/Vaping) for 24 hours prior to your procedure.  If you use a CPAP at night, you may bring your mask/headgear for your overnight stay.   Contacts, glasses, piercing's, hearing aid's, dentures or partials may not be worn into surgery, please bring cases for these belongings.    For patients admitted to the hospital, discharge time will be determined by your treatment team.   Patients discharged the day of surgery will not be allowed to drive home, and someone needs to stay with them for 24 hours.  SURGICAL WAITING ROOM VISITATION Patients having surgery or a procedure may have no more than 2 support people in the waiting area - these visitors may rotate.   Children under the age of 10 must have an adult with them who is not the patient. If the patient needs to stay at the hospital during part of their recovery, the visitor guidelines for inpatient rooms apply. Pre-op nurse will coordinate an appropriate time for 1 support person to accompany patient in pre-op.  This support person may not rotate.   Please refer to the [facility] website for the visitor guidelines for Inpatients (after your surgery is over and you are in a regular room).    Special instructions:   [place]- Preparing For Surgery  Before surgery, you can play an important role. Because  skin is not sterile, your skin needs to be as free of germs as possible. You can reduce the number of germs on your skin by washing with CHG (chlorahexidine gluconate) Soap before surgery.  CHG is an antiseptic cleaner which kills germs and bonds with the skin to continue killing germs even after washing.    Oral Hygiene is also important to reduce your risk of infection.  Remember - BRUSH YOUR TEETH THE MORNING OF SURGERY WITH YOUR REGULAR TOOTHPASTE  Please do not use if you have an allergy to CHG or antibacterial soaps. If your skin becomes reddened/irritated stop  using the CHG.  Do not shave (including legs and underarms) for at least 48 hours prior to first CHG shower. It is OK to shave your face.  Please follow these instructions carefully.   Shower the NIGHT BEFORE SURGERY and the MORNING OF SURGERY  If you chose to wash your hair, wash your hair first as usual with your normal shampoo.  After you shampoo, rinse your hair and body thoroughly to remove the shampoo.  Use CHG Soap as you would any other liquid soap. You can apply CHG directly to the skin and wash gently with a scrungie or a clean washcloth.   Apply the CHG Soap to your body ONLY FROM THE NECK DOWN.  Do not use on open wounds or open sores. Avoid contact with your eyes, ears, mouth and genitals (private parts). Wash Face and genitals (private parts)  with your normal soap.   Wash thoroughly, paying special attention to the area where your surgery will be performed.  Thoroughly rinse your body with warm water from the neck down.  DO NOT shower/wash with your normal soap after using and rinsing off the CHG Soap.  Pat yourself dry with a CLEAN TOWEL.  Wear CLEAN PAJAMAS to bed the night before surgery  Place CLEAN SHEETS on your bed the night before your surgery  DO NOT SLEEP WITH PETS.   Day of Surgery: Take a shower with CHG soap. Do not wear jewelry or makeup Do not wear lotions, powders, perfumes/colognes, or deodorant. Do not shave 48 hours prior to surgery.  Men may shave face and neck. Do not bring valuables to the hospital.  [facility] is not responsible for any belongings or valuables. Do not wear nail polish, gel polish, artificial nails, or any other type of covering on natural nails (fingers and toes) If you have artificial nails or gel coating that need to be removed by a nail salon, please have this removed prior to surgery. Artificial nails or gel coating may interfere with anesthesia's ability to adequately monitor your vital signs.  Wear Clean/Comfortable  clothing the morning of surgery Remember to brush your teeth WITH YOUR REGULAR TOOTHPASTE.   Please read over the following fact sheets that you were given.    If you received a COVID test during your pre-op visit  it is requested that you wear a mask when out in public, stay away from anyone that may not be feeling well and notify your surgeon if you develop symptoms. If you have been in contact with anyone that has tested positive in the last 10 days please notify you surgeon.

## 2022-10-06 ENCOUNTER — Other Ambulatory Visit: Payer: Self-pay

## 2022-10-06 ENCOUNTER — Encounter (HOSPITAL_COMMUNITY): Payer: Self-pay

## 2022-10-06 ENCOUNTER — Encounter (HOSPITAL_COMMUNITY)
Admission: RE | Admit: 2022-10-06 | Discharge: 2022-10-06 | Disposition: A | Discharge status: Home / Self Care | Payer: Medicare PPO | Source: Ambulatory Visit | Attending: General Surgery | Admitting: General Surgery

## 2022-10-06 DIAGNOSIS — Z85828 Personal history of other malignant neoplasm of skin: Secondary | ICD-10-CM | POA: Insufficient documentation

## 2022-10-06 DIAGNOSIS — E7800 Pure hypercholesterolemia, unspecified: Secondary | ICD-10-CM | POA: Diagnosis not present

## 2022-10-06 DIAGNOSIS — Z01818 Encounter for other preprocedural examination: Secondary | ICD-10-CM | POA: Diagnosis not present

## 2022-10-06 DIAGNOSIS — C50911 Malignant neoplasm of unspecified site of right female breast: Secondary | ICD-10-CM | POA: Diagnosis not present

## 2022-10-06 HISTORY — DX: Unspecified osteoarthritis, unspecified site: M19.90

## 2022-10-06 NOTE — Progress Notes (Signed)
PCP - Lorenda Ishihara, MD Cardiologist - Denies Oncologist: Rachel Moulds, MD  PPM/ICD - Denies   Chest x-ray - Denies EKG - Denies Stress Test - Denies ECHO - Denies Cardiac Cath - Denies  Sleep Study - Denies  DM: Denies  Blood Thinner Instructions:N/A Aspirin Instructions:  ERAS Protcol - Yes PRE-SURGERY Ensure or G2- No drink  COVID TEST- N/A   Anesthesia review: Yes, per surgeons orders  Patient denies shortness of breath, fever, cough and chest pain at PAT appointment   All instructions explained to the patient, with a verbal understanding of the material. Patient agrees to go over the instructions while at home for a better understanding.The opportunity to ask questions was provided.

## 2022-10-07 NOTE — Progress Notes (Addendum)
Anesthesia Chart Review:  Case: [id number] Date/Time: 10/13/22 1230   Procedure: RIGHT MASTECTOMY WITH AXILLARY SENTINEL LYMPH NODE BIOPSY (Right: Breast)   Anesthesia type: General   Pre-op diagnosis: RIGHT BREAST CANCER   Location: [facility] OR ROOM 09 / [facility] OR   Surgeons: [name], MD       DISCUSSION: Patient is a 82 year old female scheduled for the above procedure.   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   She reportedly has a remote she can bring to turn of her deep brain stimulator temporarily for surgery.   She called on 10/07/22 to speak with one of our RNs about taking cold medication. Upon further questioning she reported a fever and sore throat on 10/01/22 that resolved within 24 hours. She subsequently developed a productive cough with clear or yellow sputum which sounds like has been improving. She has biopsy proven right breast cancer, so will plan to follow-up with her prior to surgery to reassess for resolution/improvement of symptoms.    ADDENDUM 10/12/22 9:28 AM: I previously reviewed with anesthesiologist Autumn Patty, MD. Patient patient had reported URI symptoms within 14 days of surgery, but was improving as of last week. She has breast cancer, so surgery is times sensitive. If she felt recovered from her URI then anticipated plan was to proceed as scheduled. On 10/12/22, she reported that she feels better. No fever, SOB, wheezing. No persistent cough--occasional clear phlegm but mostly only occasional dry cough. She did not sound hoarse or congested on the phone. No persisted coughing noted. She confirmed she will bring her deep brain stimulator remote to temporarily turn it off for surgery.    She normally takes Sinemet IR and propranolol TID (around meal  times) for her tremor. She will be due for her afternoon dose about the time her surgery is scheduled to begin. Will defer to anesthesiologist if he/she prefers patient take in [place] or post-operatively.  Patient denied issues with GERD or gastroparesis.   VS: BP 129/63   Pulse 64   Temp 36.5 C   Resp 16   Ht 5\' 3"  (1.6 m)   Wt 67.5 kg   SpO2 97%   BMI 26.36 kg/m    PROVIDERS: Lorenda Ishihara, MD is PCP [facility]) Hulen Shouts, MD is neurologist [facility]) Rachel Moulds, MD is HEM-[facility] Dorothy Puffer, MD is RAD-[facility]   LABS: Most recent lab results in [facility] include: Lab Results  Component Value Date   WBC 7.4 09/30/2022   HGB 14.4 09/30/2022   HCT 42.3 09/30/2022   PLT 155 09/30/2022   GLUCOSE 105 (H) 09/30/2022   ALT <5 09/30/2022   AST 15 09/30/2022   NA 136 09/30/2022   K 4.4 09/30/2022   CL 103 09/30/2022   CREATININE 0.67 09/30/2022   BUN 16 09/30/2022   CO2 28 09/30/2022   TSH 3.070 07/28/2017    EKG: N/A   CV: US Carotid 05/14/22 ([facility]): IMPRESSION:  1. No hemodynamically significant stenosis on either side.   2.  Both vertebral arteries are patent with antegrade flow.  3.  There are tiny nonspecific subcentimeter echogenic lesions adjacent to the thyroid lobes bilaterally. Differential includes prominent fat, artifact, ectopic thyroid tissue. A CT scan of the neck with intravenous contrast might be helpful in further characterization.     Past Medical History:  Diagnosis Date  Arthritis    Breast cancer (HCC)    Diverticulitis    High cholesterol    High triglycerides    Lactose intolerance    Skin cancer    Torn meniscus    bilateral   Tremor     Past Surgical History:  Procedure Laterality Date   BREAST BIOPSY Right 09/22/2022   [place] RT BREAST BX W LOC DEV 1ST LESION IMG BX SPEC US GUIDE 09/22/2022 [facility] MAMMOGRAPHY   BREAST BIOPSY Right 09/22/2022   [place] RT BREAST BX W LOC DEV EA ADD LESION IMG BX SPEC US GUIDE 09/22/2022  [facility] MAMMOGRAPHY   BREAST EXCISIONAL BIOPSY Left 1990   CATARACT EXTRACTION Right 05/2022   CATARACT EXTRACTION Left 08/2022   COLONOSCOPY  2019   DEEP BRAIN STIMULATOR PLACEMENT  01/2021   DEEP BRAIN STIMULATOR PLACEMENT  02/2021   DENTAL SURGERY  2017   DILATION AND CURETTAGE OF UTERUS  1990   INJECTION KNEE Right 2020   "gel"   microscopic knee surgery Left 12/2017   for torn meniscus   MULTIPLE TOOTH EXTRACTIONS  07/2022   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   skin cancer removal  1990s   TONSILLECTOMY     as a child   WISDOM TOOTH EXTRACTION      MEDICATIONS:  alendronate (FOSAMAX) 70 MG tablet   aspirin EC 81 MG tablet   atorvastatin (LIPITOR) 10 MG tablet   CALCIUM CITRATE-VITAMIN D3 PO   carbidopa-levodopa (SINEMET IR) 25-100 MG tablet   carboxymethylcellulose 1 % ophthalmic solution   cholecalciferol (VITAMIN D3) 25 MCG (1000 UNIT) tablet   clobetasol (TEMOVATE) 0.05 % external solution   Coenzyme Q10 (CO Q 10) 100 MG CAPS   diclofenac Sodium (VOLTAREN) 1 % GEL   lactase (LACTAID) 3000 units tablet   Probiotic Product (ALIGN) 4 MG CAPS   propranolol (INDERAL) 10 MG tablet   vitamin C (ASCORBIC ACID) 250 MG tablet   Wheat Dextrin (BENEFIBER DRINK MIX PO)   No current facility-administered medications for this encounter.    Shonna Chock, PA-C Surgical Short Stay/Anesthesiology [facility] Phone [phone] [facility] Phone [phone] 10/07/2022 7:52 PM

## 2022-10-07 NOTE — Progress Notes (Signed)
Patient called to review what medications she could take and she inquired about cold medicine. RN asked if she was experiencing any cold symptoms currently and pt said yes. She had a fever May 9th that resolved with Tylenol, a sore throat May 10th that resolved within 24 hours, and she now endorses a productive cough with clear/yellow phlegm. Discussed with Shonna Chock, PA-C. Pt feels her symptoms are improving. RN instructed pt to notify us if her symptoms were to get any worse. If he symptoms continue to improve, she does not need to call us and one of our anesthesia APPs will reach out to her and follow-up on her symptoms. She was also instructed that she can take OTC medication for cold symptoms, but not to take any the morning of surgery. Pt understood all instructions.

## 2022-10-08 ENCOUNTER — Telehealth: Payer: Self-pay | Admitting: *Deleted

## 2022-10-08 ENCOUNTER — Telehealth: Payer: Self-pay | Admitting: Hematology and Oncology

## 2022-10-08 ENCOUNTER — Encounter: Payer: Self-pay | Admitting: *Deleted

## 2022-10-08 NOTE — Telephone Encounter (Signed)
Spoke to pt concerning [facility]C from 09/30/22. Denies questions or concerns regarding dx or treatment care plan. Encourage pt to call with needs. Received verbal understanding.

## 2022-10-08 NOTE — Telephone Encounter (Signed)
Spoke with patient confirming upcoming appointment  

## 2022-10-12 NOTE — Anesthesia Preprocedure Evaluation (Addendum)
Anesthesia Evaluation  Patient identified by MRN, date of birth, ID band Patient awake    Reviewed: Allergy & Precautions, NPO status , Patient's Chart, lab work & pertinent test results, reviewed documented beta blocker date and time   Airway Mallampati: II       Dental no notable dental hx. (+) Dental Advisory Given, Teeth Intact   Pulmonary neg pulmonary ROS   Pulmonary exam normal breath sounds clear to auscultation       Cardiovascular negative cardio ROS Normal cardiovascular exam Rhythm:Regular Rate:Normal     Neuro/Psych Parkinson's Disease  Hx/o placement of deep brain stimulator  negative psych ROS   GI/Hepatic Neg liver ROS,,,Hx/o divertulitis   Endo/Other  Hyperlipidemia Right breast Ca  Renal/GU negative Renal ROS  negative genitourinary   Musculoskeletal  (+) Arthritis , Osteoarthritis,    Abdominal   Peds  Hematology negative hematology ROS (+)   Anesthesia Other Findings   Reproductive/Obstetrics                              Anesthesia Physical Anesthesia Plan  ASA: 3  Anesthesia Plan: General   Post-op Pain Management: Regional block* and Minimal or no pain anticipated   Induction: Intravenous  PONV Risk Score and Plan: 4 or greater and Treatment may vary due to age or medical condition and Ondansetron  Airway Management Planned: LMA  Additional Equipment: None  Intra-op Plan:   Post-operative Plan: Extubation in OR  Informed Consent: I have reviewed the patients History and Physical, chart, labs and discussed the procedure including the risks, benefits and alternatives for the proposed anesthesia with the patient or authorized representative who has indicated his/her understanding and acceptance.     Dental advisory given  Plan Discussed with: CRNA and Anesthesiologist  Anesthesia Plan Comments: (PAT note written by Shonna Chock, PA-C. She will  bring her deep brain stimulator remote.  )        Anesthesia Quick Evaluation

## 2022-10-13 ENCOUNTER — Observation Stay (HOSPITAL_COMMUNITY)
Admission: RE | Admit: 2022-10-13 | Discharge: 2022-10-14 | Disposition: A | Discharge status: Home / Self Care | Payer: Medicare PPO | Attending: General Surgery | Admitting: General Surgery

## 2022-10-13 ENCOUNTER — Ambulatory Visit (HOSPITAL_COMMUNITY): Payer: Medicare PPO | Admitting: Vascular Surgery

## 2022-10-13 ENCOUNTER — Ambulatory Visit (HOSPITAL_BASED_OUTPATIENT_CLINIC_OR_DEPARTMENT_OTHER): Payer: Medicare PPO | Admitting: Certified Registered Nurse Anesthetist

## 2022-10-13 ENCOUNTER — Encounter (HOSPITAL_COMMUNITY): Payer: Self-pay | Admitting: General Surgery

## 2022-10-13 ENCOUNTER — Encounter (HOSPITAL_COMMUNITY)
Admission: RE | Disposition: A | Discharge status: Home / Self Care | Payer: Self-pay | Source: Home / Self Care | Attending: General Surgery

## 2022-10-13 ENCOUNTER — Other Ambulatory Visit: Payer: Self-pay

## 2022-10-13 DIAGNOSIS — Z8673 Personal history of transient ischemic attack (TIA), and cerebral infarction without residual deficits: Secondary | ICD-10-CM | POA: Diagnosis not present

## 2022-10-13 DIAGNOSIS — C50811 Malignant neoplasm of overlapping sites of right female breast: Secondary | ICD-10-CM | POA: Diagnosis not present

## 2022-10-13 DIAGNOSIS — G20A1 Parkinson's disease without dyskinesia, without mention of fluctuations: Secondary | ICD-10-CM | POA: Diagnosis not present

## 2022-10-13 DIAGNOSIS — N6091 Unspecified benign mammary dysplasia of right breast: Secondary | ICD-10-CM | POA: Diagnosis not present

## 2022-10-13 DIAGNOSIS — M1990 Unspecified osteoarthritis, unspecified site: Secondary | ICD-10-CM | POA: Diagnosis not present

## 2022-10-13 DIAGNOSIS — Z170 Estrogen receptor positive status [ER+]: Secondary | ICD-10-CM | POA: Insufficient documentation

## 2022-10-13 DIAGNOSIS — Z9011 Acquired absence of right breast and nipple: Secondary | ICD-10-CM

## 2022-10-13 DIAGNOSIS — C50911 Malignant neoplasm of unspecified site of right female breast: Secondary | ICD-10-CM | POA: Diagnosis not present

## 2022-10-13 DIAGNOSIS — N6081 Other benign mammary dysplasias of right breast: Secondary | ICD-10-CM | POA: Diagnosis not present

## 2022-10-13 DIAGNOSIS — Z85828 Personal history of other malignant neoplasm of skin: Secondary | ICD-10-CM | POA: Diagnosis not present

## 2022-10-13 DIAGNOSIS — D241 Benign neoplasm of right breast: Secondary | ICD-10-CM | POA: Diagnosis not present

## 2022-10-13 DIAGNOSIS — G8918 Other acute postprocedural pain: Secondary | ICD-10-CM | POA: Diagnosis not present

## 2022-10-13 DIAGNOSIS — E785 Hyperlipidemia, unspecified: Secondary | ICD-10-CM | POA: Diagnosis not present

## 2022-10-13 HISTORY — PX: MASTECTOMY W/ SENTINEL NODE BIOPSY: SHX2001

## 2022-10-13 SURGERY — MASTECTOMY WITH SENTINEL LYMPH NODE BIOPSY
Anesthesia: General | Site: Breast | Laterality: Right

## 2022-10-13 MED ORDER — PHENYLEPHRINE 80 MCG/ML (10ML) SYRINGE FOR IV PUSH (FOR BLOOD PRESSURE SUPPORT)
PREFILLED_SYRINGE | INTRAVENOUS | Status: DC | PRN
  Administered 2022-10-13 (×2): 160 ug via INTRAVENOUS

## 2022-10-13 MED ORDER — ENSURE PRE-SURGERY PO LIQD: 296.0000 mL | Freq: Once | ORAL | Status: DC

## 2022-10-13 MED ORDER — METHOCARBAMOL 1000 MG/10ML IJ SOLN: 500.0000 mg | Freq: Three times a day (TID) | INTRAVENOUS | Status: DC | PRN

## 2022-10-13 MED ORDER — FENTANYL CITRATE (PF) 100 MCG/2ML IJ SOLN
INTRAMUSCULAR | Status: AC
  Administered 2022-10-13: 50 ug via INTRAVENOUS
  Filled 2022-10-13: qty 2

## 2022-10-13 MED ORDER — SIMETHICONE 80 MG PO CHEW: 40.0000 mg | CHEWABLE_TABLET | Freq: Four times a day (QID) | ORAL | Status: DC | PRN

## 2022-10-13 MED ORDER — ROCURONIUM BROMIDE 10 MG/ML (PF) SYRINGE
PREFILLED_SYRINGE | INTRAVENOUS | Status: AC
  Filled 2022-10-13: qty 10

## 2022-10-13 MED ORDER — LIDOCAINE 2% (20 MG/ML) 5 ML SYRINGE
INTRAMUSCULAR | Status: AC
  Filled 2022-10-13: qty 5

## 2022-10-13 MED ORDER — SODIUM CHLORIDE 0.9 % IV SOLN: INTRAVENOUS | Status: DC

## 2022-10-13 MED ORDER — 0.9 % SODIUM CHLORIDE (POUR BTL) OPTIME
TOPICAL | Status: DC | PRN
  Administered 2022-10-13: 1000 mL

## 2022-10-13 MED ORDER — PROPOFOL 10 MG/ML IV BOLUS
INTRAVENOUS | Status: DC | PRN
  Administered 2022-10-13: 20 mg via INTRAVENOUS
  Administered 2022-10-13: 50 mg via INTRAVENOUS
  Administered 2022-10-13: 20 mg via INTRAVENOUS
  Administered 2022-10-13: 130 mg via INTRAVENOUS
  Administered 2022-10-13: 20 mg via INTRAVENOUS

## 2022-10-13 MED ORDER — CHLORHEXIDINE GLUCONATE CLOTH 2 % EX PADS: 6.0000 | MEDICATED_PAD | Freq: Once | CUTANEOUS | Status: DC

## 2022-10-13 MED ORDER — PHENYLEPHRINE HCL-NACL 20-0.9 MG/250ML-% IV SOLN
INTRAVENOUS | Status: DC | PRN
  Administered 2022-10-13: 45 ug/min via INTRAVENOUS

## 2022-10-13 MED ORDER — SUGAMMADEX SODIUM 200 MG/2ML IV SOLN
INTRAVENOUS | Status: DC | PRN
  Administered 2022-10-13: 200 mg via INTRAVENOUS

## 2022-10-13 MED ORDER — BUPIVACAINE HCL (PF) 0.5 % IJ SOLN
INTRAMUSCULAR | Status: DC | PRN
  Administered 2022-10-13: 20 mL via PERINEURAL

## 2022-10-13 MED ORDER — LIDOCAINE 2% (20 MG/ML) 5 ML SYRINGE
INTRAMUSCULAR | Status: DC | PRN
  Administered 2022-10-13: 40 mg via INTRAVENOUS

## 2022-10-13 MED ORDER — ONDANSETRON 4 MG PO TBDP: 4.0000 mg | ORAL_TABLET | Freq: Four times a day (QID) | ORAL | Status: DC | PRN

## 2022-10-13 MED ORDER — OXYCODONE HCL 5 MG/5ML PO SOLN: 5.0000 mg | Freq: Once | ORAL | Status: DC | PRN

## 2022-10-13 MED ORDER — ACETAMINOPHEN 500 MG PO TABS: 1000.0000 mg | ORAL_TABLET | ORAL | Status: AC

## 2022-10-13 MED ORDER — OXYCODONE HCL 5 MG PO TABS: 5.0000 mg | ORAL_TABLET | Freq: Once | ORAL | Status: DC | PRN

## 2022-10-13 MED ORDER — PHENYLEPHRINE 80 MCG/ML (10ML) SYRINGE FOR IV PUSH (FOR BLOOD PRESSURE SUPPORT)
PREFILLED_SYRINGE | INTRAVENOUS | Status: AC
  Filled 2022-10-13: qty 20

## 2022-10-13 MED ORDER — ONDANSETRON HCL 4 MG/2ML IJ SOLN: 4.0000 mg | Freq: Four times a day (QID) | INTRAMUSCULAR | Status: DC | PRN

## 2022-10-13 MED ORDER — CHLORHEXIDINE GLUCONATE 0.12 % MT SOLN
OROMUCOSAL | Status: AC
  Administered 2022-10-13: 15 mL via OROMUCOSAL
  Filled 2022-10-13: qty 15

## 2022-10-13 MED ORDER — ONDANSETRON HCL 4 MG/2ML IJ SOLN
INTRAMUSCULAR | Status: AC
  Filled 2022-10-13: qty 2

## 2022-10-13 MED ORDER — ACETAMINOPHEN 500 MG PO TABS
1000.0000 mg | ORAL_TABLET | Freq: Four times a day (QID) | ORAL | Status: DC
  Administered 2022-10-13 – 2022-10-14 (×4): 1000 mg via ORAL
  Filled 2022-10-13 (×4): qty 2

## 2022-10-13 MED ORDER — POLYVINYL ALCOHOL 1.4 % OP SOLN: 1.0000 [drp] | Freq: Two times a day (BID) | OPHTHALMIC | Status: DC | PRN

## 2022-10-13 MED ORDER — CEFAZOLIN SODIUM-DEXTROSE 2-4 GM/100ML-% IV SOLN
2.0000 g | INTRAVENOUS | Status: AC
  Administered 2022-10-13: 2 g via INTRAVENOUS
  Filled 2022-10-13: qty 100

## 2022-10-13 MED ORDER — LACTATED RINGERS IV SOLN: INTRAVENOUS | Status: DC

## 2022-10-13 MED ORDER — ACETAMINOPHEN 500 MG PO TABS
ORAL_TABLET | ORAL | Status: AC
  Administered 2022-10-13: 1000 mg via ORAL
  Filled 2022-10-13: qty 2

## 2022-10-13 MED ORDER — TRAMADOL HCL 50 MG PO TABS: 50.0000 mg | ORAL_TABLET | Freq: Four times a day (QID) | ORAL | Status: DC | PRN

## 2022-10-13 MED ORDER — BUPIVACAINE LIPOSOME 1.3 % IJ SUSP
INTRAMUSCULAR | Status: DC | PRN
  Administered 2022-10-13: 10 mL via PERINEURAL

## 2022-10-13 MED ORDER — FENTANYL CITRATE (PF) 250 MCG/5ML IJ SOLN
INTRAMUSCULAR | Status: AC
  Filled 2022-10-13: qty 5

## 2022-10-13 MED ORDER — FENTANYL CITRATE (PF) 100 MCG/2ML IJ SOLN: 50.0000 ug | Freq: Once | INTRAMUSCULAR | Status: AC

## 2022-10-13 MED ORDER — PHENYLEPHRINE HCL (PRESSORS) 10 MG/ML IV SOLN
INTRAVENOUS | Status: AC
  Filled 2022-10-13: qty 1

## 2022-10-13 MED ORDER — MIDAZOLAM HCL 2 MG/2ML IJ SOLN
INTRAMUSCULAR | Status: AC
  Filled 2022-10-13: qty 2

## 2022-10-13 MED ORDER — CHLORHEXIDINE GLUCONATE 0.12 % MT SOLN: 15.0000 mL | Freq: Once | OROMUCOSAL | Status: AC

## 2022-10-13 MED ORDER — ONDANSETRON HCL 4 MG/2ML IJ SOLN
INTRAMUSCULAR | Status: DC | PRN
  Administered 2022-10-13: 4 mg via INTRAVENOUS

## 2022-10-13 MED ORDER — METHOCARBAMOL 500 MG PO TABS: 500.0000 mg | ORAL_TABLET | Freq: Three times a day (TID) | ORAL | Status: DC | PRN

## 2022-10-13 MED ORDER — HEMOSTATIC AGENTS (NO CHARGE) OPTIME
TOPICAL | Status: DC | PRN
  Administered 2022-10-13: 3 via TOPICAL

## 2022-10-13 MED ORDER — OXYCODONE HCL 5 MG PO TABS: 5.0000 mg | ORAL_TABLET | ORAL | Status: DC | PRN

## 2022-10-13 MED ORDER — DEXAMETHASONE SODIUM PHOSPHATE 10 MG/ML IJ SOLN
INTRAMUSCULAR | Status: DC | PRN
  Administered 2022-10-13: 5 mg via INTRAVENOUS

## 2022-10-13 MED ORDER — PROPOFOL 10 MG/ML IV BOLUS
INTRAVENOUS | Status: AC
  Filled 2022-10-13: qty 20

## 2022-10-13 MED ORDER — MAGTRACE LYMPHATIC TRACER
INTRAMUSCULAR | Status: DC | PRN
  Administered 2022-10-13: 2 mL via INTRAMUSCULAR

## 2022-10-13 MED ORDER — ONDANSETRON HCL 4 MG/2ML IJ SOLN: 4.0000 mg | Freq: Once | INTRAMUSCULAR | Status: DC | PRN

## 2022-10-13 MED ORDER — FENTANYL CITRATE (PF) 250 MCG/5ML IJ SOLN
INTRAMUSCULAR | Status: DC | PRN
  Administered 2022-10-13 (×2): 50 ug via INTRAVENOUS

## 2022-10-13 MED ORDER — CARBIDOPA-LEVODOPA 25-100 MG PO TABS
1.0000 | ORAL_TABLET | Freq: Three times a day (TID) | ORAL | Status: DC
  Administered 2022-10-13 – 2022-10-14 (×2): 1 via ORAL
  Filled 2022-10-13 (×3): qty 1

## 2022-10-13 MED ORDER — ROCURONIUM BROMIDE 100 MG/10ML IV SOLN
INTRAVENOUS | Status: DC | PRN
  Administered 2022-10-13: 30 mg via INTRAVENOUS

## 2022-10-13 MED ORDER — TRANEXAMIC ACID 1000 MG/10ML IV SOLN
2000.0000 mg | Freq: Once | INTRAVENOUS | Status: AC
  Administered 2022-10-13: 2000 mg via TOPICAL
  Filled 2022-10-13: qty 20

## 2022-10-13 MED ORDER — FENTANYL CITRATE (PF) 100 MCG/2ML IJ SOLN: 25.0000 ug | INTRAMUSCULAR | Status: DC | PRN

## 2022-10-13 MED ORDER — ORAL CARE MOUTH RINSE: 15.0000 mL | Freq: Once | OROMUCOSAL | Status: AC

## 2022-10-13 MED ORDER — PROPRANOLOL HCL 20 MG PO TABS
10.0000 mg | ORAL_TABLET | Freq: Three times a day (TID) | ORAL | Status: DC
  Administered 2022-10-14: 10 mg via ORAL
  Filled 2022-10-13: qty 1

## 2022-10-13 MED ORDER — ATORVASTATIN CALCIUM 10 MG PO TABS
10.0000 mg | ORAL_TABLET | Freq: Every day | ORAL | Status: DC
  Filled 2022-10-13: qty 1

## 2022-10-13 MED ORDER — DEXAMETHASONE SODIUM PHOSPHATE 10 MG/ML IJ SOLN
INTRAMUSCULAR | Status: AC
  Filled 2022-10-13: qty 1

## 2022-10-13 MED ORDER — BUPIVACAINE LIPOSOME 1.3 % IJ SUSP
INTRAMUSCULAR | Status: AC
  Filled 2022-10-13: qty 10

## 2022-10-13 SURGICAL SUPPLY — 62 items
ADH SKN CLS APL DERMABOND .7 (GAUZE/BANDAGES/DRESSINGS) ×2
APL PRP STRL LF DISP 70% ISPRP (MISCELLANEOUS) ×1
APPLIER CLIP 9.375 MED OPEN (MISCELLANEOUS) ×1
APR CLP MED 9.3 20 MLT OPN (MISCELLANEOUS) ×1
BAG COUNTER SPONGE SURGICOUNT (BAG) ×1 IMPLANT
BAG SPNG CNTER NS LX DISP (BAG) ×1
BINDER BREAST LRG (GAUZE/BANDAGES/DRESSINGS) IMPLANT
BINDER BREAST XLRG (GAUZE/BANDAGES/DRESSINGS) IMPLANT
BIOPATCH RED 1 DISK 7.0 (GAUZE/BANDAGES/DRESSINGS) IMPLANT
CANISTER SUCT 3000ML PPV (MISCELLANEOUS) ×1 IMPLANT
CHLORAPREP W/TINT 26 (MISCELLANEOUS) ×1 IMPLANT
CLIP APPLIE 9.375 MED OPEN (MISCELLANEOUS) IMPLANT
CNTNR URN SCR LID CUP LEK RST (MISCELLANEOUS) ×1 IMPLANT
CONT SPEC 4OZ STRL OR WHT (MISCELLANEOUS) ×1
COVER PROBE W GEL 5X96 (DRAPES) ×1 IMPLANT
COVER SURGICAL LIGHT HANDLE (MISCELLANEOUS) ×1 IMPLANT
DERMABOND ADVANCED .7 DNX12 (GAUZE/BANDAGES/DRESSINGS) ×1 IMPLANT
DRAIN CHANNEL 19F RND (DRAIN) ×1 IMPLANT
DRAPE CHEST BREAST 15X10 FENES (DRAPES) IMPLANT
DRAPE TOP ARMCOVERS (MISCELLANEOUS) ×1 IMPLANT
DRAPE U-SHAPE 76X120 STRL (DRAPES) ×1 IMPLANT
DRSG TEGADERM 4X4.75 (GAUZE/BANDAGES/DRESSINGS) IMPLANT
ELECT BLADE 4.0 EZ CLEAN MEGAD (MISCELLANEOUS) ×1
ELECT CAUTERY BLADE 6.4 (BLADE) ×1 IMPLANT
ELECT REM PT RETURN 9FT ADLT (ELECTROSURGICAL) ×1
ELECTRODE BLDE 4.0 EZ CLN MEGD (MISCELLANEOUS) ×1 IMPLANT
ELECTRODE REM PT RTRN 9FT ADLT (ELECTROSURGICAL) ×1 IMPLANT
EVACUATOR SILICONE 100CC (DRAIN) ×1 IMPLANT
GAUZE SPONGE 4X4 12PLY STRL (GAUZE/BANDAGES/DRESSINGS) ×1 IMPLANT
GLOVE BIO SURGEON STRL SZ7 (GLOVE) ×1 IMPLANT
GLOVE BIOGEL PI IND STRL 7.5 (GLOVE) ×1 IMPLANT
GOWN STRL REUS W/ TWL LRG LVL3 (GOWN DISPOSABLE) ×3 IMPLANT
GOWN STRL REUS W/TWL LRG LVL3 (GOWN DISPOSABLE) ×3
HEMOSTAT ARISTA ABSORB 3G PWDR (HEMOSTASIS) IMPLANT
KIT BASIN OR (CUSTOM PROCEDURE TRAY) ×1 IMPLANT
KIT TURNOVER KIT B (KITS) ×1 IMPLANT
MARKER SKIN DUAL TIP RULER LAB (MISCELLANEOUS) ×1 IMPLANT
NDL 18GX1X1/2 (RX/OR ONLY) (NEEDLE) IMPLANT
NDL FILTER BLUNT 18X1 1/2 (NEEDLE) IMPLANT
NDL HYPO 25GX1X1/2 BEV (NEEDLE) IMPLANT
NEEDLE 18GX1X1/2 (RX/OR ONLY) (NEEDLE) IMPLANT
NEEDLE FILTER BLUNT 18X1 1/2 (NEEDLE) ×1 IMPLANT
NEEDLE HYPO 25GX1X1/2 BEV (NEEDLE) ×1 IMPLANT
NS IRRIG 1000ML POUR BTL (IV SOLUTION) ×1 IMPLANT
PACK GENERAL/GYN (CUSTOM PROCEDURE TRAY) ×1 IMPLANT
PAD ARMBOARD 7.5X6 YLW CONV (MISCELLANEOUS) ×1 IMPLANT
PENCIL SMOKE EVACUATOR (MISCELLANEOUS) ×1 IMPLANT
PIN SAFETY STERILE (MISCELLANEOUS) ×1 IMPLANT
SPECIMEN JAR X LARGE (MISCELLANEOUS) ×1 IMPLANT
STAPLER VISISTAT 35W (STAPLE) ×1 IMPLANT
STRIP CLOSURE SKIN 1/2X4 (GAUZE/BANDAGES/DRESSINGS) ×1 IMPLANT
STRIP CLOSURE SKIN 1/4X4 (GAUZE/BANDAGES/DRESSINGS) IMPLANT
SUT ETHILON 2 0 FS 18 (SUTURE) ×1 IMPLANT
SUT MNCRL AB 4-0 PS2 18 (SUTURE) ×1 IMPLANT
SUT SILK 2 0 SH (SUTURE) IMPLANT
SUT VIC AB 3-0 54X BRD REEL (SUTURE) ×1 IMPLANT
SUT VIC AB 3-0 BRD 54 (SUTURE)
SUT VIC AB 3-0 SH 18 (SUTURE) ×1 IMPLANT
SUT VIC AB 3-0 SH 8-18 (SUTURE) ×1 IMPLANT
SYR CONTROL 10ML LL (SYRINGE) IMPLANT
TOWEL GREEN STERILE (TOWEL DISPOSABLE) ×1 IMPLANT
TOWEL GREEN STERILE FF (TOWEL DISPOSABLE) ×1 IMPLANT

## 2022-10-13 NOTE — Anesthesia Procedure Notes (Signed)
Procedure Name: Intubation Date/Time: 10/13/2022 1:34 PM  Performed by: Alease Medina, CRNAPre-anesthesia Checklist: Patient identified, Emergency Drugs available, Suction available and Patient being monitored Patient Re-evaluated:Patient Re-evaluated prior to induction Oxygen Delivery Method: Circle system utilized Preoxygenation: Pre-oxygenation with 100% oxygen Induction Type: IV induction Ventilation: Mask ventilation without difficulty LMA: LMA inserted LMA Size: 4.0 Number of attempts: 1 Airway Equipment and Method: Oral airway Placement Confirmation: positive ETCO2, breath sounds checked- equal and bilateral and CO2 detector Tube secured with: Tape Dental Injury: Teeth and Oropharynx as per pre-operative assessment

## 2022-10-13 NOTE — Transfer of Care (Signed)
Immediate Anesthesia Transfer of Care Note  Patient: Jodi Medina  Procedure(s) Performed: RIGHT MASTECTOMY WITH AXILLARY SENTINEL LYMPH NODE BIOPSY (Right: Breast)  Patient Location: PACU  Anesthesia Type:General  Level of Consciousness: awake  Airway & Oxygen Therapy: Patient Spontanous Breathing and Patient connected to face mask oxygen  Post-op Assessment: Report given to RN and Post -op Vital signs reviewed and stable  Post vital signs: Reviewed and stable  Last Vitals:  Vitals Value Taken Time  BP    Temp    Pulse    Resp    SpO2      Last Pain:  Vitals:   10/13/22 1255  TempSrc:   PainSc: 0-No pain         Complications: No notable events documented.

## 2022-10-13 NOTE — H&P (Signed)
20 yof with Parkinsons and DBS implanted who has fh in a maternal aunt. She noted a palpable breast mass with nipple inversion in January. No discharged. She has retroareolar mass with small satellite. On [place] at 12 oclock there is a 2.1x1.2x1.5 cm mass and at 1 oclock there is a 9x9x5 mm mass noted. Axillary [place] is negative. Clips are 2.3 cm apart but this is not the entire area. 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 She is here with her daughter and husband to discuss options.  Review of Systems: A complete review of systems was obtained from the patient. I have reviewed this information and discussed as appropriate with the patient. See HPI as well for other ROS.  Review of Systems  All other systems reviewed and are negative.  Medical History: Past Medical History:  Diagnosis Date  History of cancer  History of stroke  Hyperlipidemia   Patient Active Problem List  Diagnosis  Back pain  Colon adenomas  Diverticulosis of colon  Essential tremor  History of diverticulitis  FHx: dementia  Hyperlipidemia  Malignant neoplasm of overlapping sites of right female breast (CMS/HHS-HCC)  S/P deep brain stimulator placement  Enteritis due to Clostridium difficile  Dry skin  Tinnitus of both ears   Past Surgical History:  Procedure Laterality Date  .breast biopsy N/A 1990  dental surgery 2017  knee surgery - microscopic 12/2017  DEEP BRAIN STIMULATOR PHASE 1 ELECTRODE PLACEMENT 02/20/2021  DEEP BRAIN STIMULATOR PHASE 2 GENERATOR PLACEMENT 03/13/2021  right breast biopsy 09/22/2022  EXCISION PAROTID GLAND/TUMOR  1980's  skin cancer removal  1990's    Allergies  Allergen Reactions  Lactose Diarrhea   Current Outpatient Medications on File Prior to Visit  Medication Sig Dispense Refill  aspirin 81 MG EC tablet Take 81 mg by mouth once daily  atorvastatin (LIPITOR) 10 MG tablet Take 1 tablet by mouth once  daily  calcium citrate-vitamin D3 (CITRACAL+D) 315 mg-6.25 mcg (250 unit) tablet Take 1 tablet by mouth once daily  carbidopa-levodopa (SINEMET) 25-100 mg tablet Take 1 tablet by mouth 3 (three) times daily  cholecalciferol (VITAMIN D3) 1000 unit tablet Take 1,000 Units by mouth once daily  gabapentin (NEURONTIN) 100 MG capsule Take 100 mg by mouth 3 (three) times daily  morphine (MSIR) 15 MG immediate release tablet Take 0.5 tablets by mouth every 4 (four) hours as needed for Pain  propranoloL (INDERAL) 10 MG tablet Take 1 tablet by mouth 3 (three) times daily  propylene glycoL (SYSTANE BALANCE) 0.6 % ophthalmic drops Place 1 drop into both eyes at bedtime as needed  vitamin B complex TbER Take 1 tablet by mouth once daily   Family History  Problem Relation Age of Onset  Hyperlipidemia (Elevated cholesterol) Mother    Social History   Tobacco Use  Smoking Status Never  Smokeless Tobacco Never  Marital status: Married  Tobacco Use  Smoking status: Never  Smokeless tobacco: Never  Vaping Use  Vaping status: Never Used  Substance and Sexual Activity  Alcohol use: Yes  Comment: socially - not a lot  Drug use: Never     Objective:    Physical Exam Constitutional:  Appearance: Normal appearance.  Chest:  Breasts: Right: Mass present. No inverted nipple or nipple discharge.  Left: Inverted nipple (longstanding since birth of a child) present. No mass or nipple discharge.  Comments: Right breast with nipple  inversion and large mass that is alot of hematoma right now Lymphadenopathy:  Upper Body:  Right upper body: No supraclavicular or axillary adenopathy.  Left upper body: No supraclavicular or axillary adenopathy.  Neurological:  Mental Status: She is alert.     Assessment and Plan:   Malignant neoplasm of overlapping sites of right breast in female, estrogen receptor positive (CMS/HHS-HCC)  Right mastectomy, right axillary sn biopsy  We discussed the staging and  pathophysiology of breast cancer. We discussed all of the different options for treatment for breast cancer including surgery, chemotherapy, radiation therapy, Herceptin, and antiestrogen therapy.  She has given me the instructions for surgery with DBS and has the remote control with her. Will follow these at surgery and turn off  We discussed a sentinel lymph node biopsy as she does not appear to having lymph node involvement right now. We discussed the performance of that with injection of Magtrace. We discussed that there is a chance of having a positive node with a sentinel lymph node biopsy and we will await the permanent pathology to make any other first further decisions in terms of her treatment. We discussed up to a 5% risk lifetime of chronic shoulder pain as well as lymphedema associated with a sentinel lymph node biopsy.  We discussed the options for treatment of the breast cancer which included lumpectomy versus a mastectomy. We discussed the performance of the lumpectomy with radioactive seed placement. I think if we were to do lumpectomy would be nac given mass and imaging as well as the entire central part of her breast. I think best option would be mastectomy for her. We discussed mastectomy and the postoperative care for that as well. She does not want eo do reconstruction. The decision for lumpectomy vs mastectomy has no impact on decision for chemotherapy.   We discussed the risks of operation including bleeding, infection, possible reoperation. She understands her further therapy will be based on what her stages at the time of her operation.

## 2022-10-13 NOTE — Anesthesia Procedure Notes (Signed)
Procedure Name: Intubation Date/Time: 10/13/2022 2:16 PM  Performed by: Alease Medina, CRNAPre-anesthesia Checklist: Patient identified, Emergency Drugs available, Suction available and Patient being monitored Patient Re-evaluated:Patient Re-evaluated prior to induction Oxygen Delivery Method: Circle system utilized Preoxygenation: Pre-oxygenation with 100% oxygen Induction Type: IV induction Ventilation: Mask ventilation without difficulty Laryngoscope Size: Mac and 3 Grade View: Grade II Tube type: Oral Tube size: 7.0 mm Number of attempts: 1 Airway Equipment and Method: Stylet and Oral airway Placement Confirmation: ETT inserted through vocal cords under direct vision, positive ETCO2 and breath sounds checked- equal and bilateral Tube secured with: Tape Dental Injury: Teeth and Oropharynx as per pre-operative assessment

## 2022-10-13 NOTE — Plan of Care (Signed)
Breast cancer Bag given to pt and discussed with pt and family who are at the bedside. Talked about JP drain and keeping a record to take to the Dr. Dwain Sarna follow up appointment with them. Offered to show the husband how to empty the drain but he deferred until tomorrow as pt had only been in the room about an hour.

## 2022-10-13 NOTE — Interval H&P Note (Signed)
History and Physical Interval Note:  10/13/2022 12:20 PM  Jodi Medina  has presented today for surgery, with the diagnosis of RIGHT BREAST CANCER.  The various methods of treatment have been discussed with the patient and family. After consideration of risks, benefits and other options for treatment, the patient has consented to  Procedure(s): RIGHT MASTECTOMY WITH AXILLARY SENTINEL LYMPH NODE BIOPSY (Right) as a surgical intervention.  The patient's history has been reviewed, patient examined, no change in status, stable for surgery.  I have reviewed the patient's chart and labs.  Questions were answered to the patient's satisfaction.     [name]

## 2022-10-13 NOTE — Op Note (Signed)
Preoperative diagnosis:clinical stage II right breast cancer Postoperative diagnosis: same as above, Procedure: Right mastectomy Injection magtrace for sentinel node identificatoin Right deep axillary sentinel lymph node biopsy Surgeon: Dr. Harden Mo Anesthesia: General with pectoral block Estimated blood loss: 100 cc Specimens: 1.  Right breast tissue marked short superior, long lateral 2.  Left deep axillary sentinel lymph nodes with highest count of 125 Complications: None Drains: 19 Fr Blake drain to both sides Sponge and needle count correct Dispo recovery stable.    Indications: 39 yof with Parkinsons and DBS implanted who has fh in a maternal aunt. She noted a palpable breast mass with nipple inversion in January. No discharged. She has retroareolar mass with small satellite. On [place] at 12 oclock there is a 2.1x1.2x1.5 cm mass and at 1 oclock there is a 9x9x5 mm mass noted. Axillary [place] is negative. Clips are 2.3 cm apart but this is not the entire area. 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 We discussed proceeding with mastectomy and sn biopsy.    Procedure: After informed consent was obtained she first underwent a pectoral block.  She was given antibiotics.  SCDs were placed.  She was placed under general anesthesia without complication.  She was prepped and draped in the standard sterile surgical fashion.  Surgical timeout was then performed.   I first injected 1.5 cc of mag trace on the right side in the upper outer quadrant as due to a very large tumor this was not able to be injected subareolar.  I massaged this for 5 minutes.  By the probe it appeared to track to the lymph nodes already.     I  made a large elliptical incision to encompass the NAC>  I then created flaps to the clavicle, parasternal area, obliterated the inframammary fold and went well below this as well as to the latissimus laterally.   I then removed the breast tissue including the fascia.  This was a large palpable tumor.  This was marked and passed off the table.  I removed all the fat overlying the latissimus as well as some excess fat in the axilla.  I then identified an area with what appeared to be a couple nodes that had activity.  I removed these from the axilla and there was no other brown, active tissue in the axilla. Once I had done this I placed a TXA soaked sponge for 5 minutes.  I then reexamined the area.  Hemostasis was observed.  I placed a 19 [place] Blake drain and secured this with 2-0 nylon suture. I also placed Arista in the cavity as this was very friable.   I then proceeded to close this with 3-0 Vicryl suture after tacking it to make flat.  It appeared very flat.  I closed the dermis with 3-0 Vicryl.  The skin was closed with 4-0 Monocryl glue and Steri-Strips were eventually applied.she tolerated well, was transferred to recovery stable.

## 2022-10-13 NOTE — Anesthesia Procedure Notes (Signed)
Anesthesia Regional Block: Pectoralis block   Pre-Anesthetic Checklist: , timeout performed,  Correct Patient, Correct Site, Correct Laterality,  Correct Procedure, Correct Position, site marked,  Risks and benefits discussed,  Surgical consent,  Pre-op evaluation,  At surgeon's request and post-op pain management  Laterality: Right  Prep: chloraprep       Needles:  Injection technique: Single-shot  Needle Type: Echogenic Stimulator Needle     Needle Length: 10cm  Needle Gauge: 21   Needle insertion depth: 7 cm   Additional Needles:   Procedures:,,,, ultrasound used (permanent image in chart),,    Narrative:  Start time: 10/13/2022 12:48 PM End time: 10/13/2022 12:53 PM Injection made incrementally with aspirations every 5 mL.  Performed by: Personally  Anesthesiologist: Mal Amabile, MD  Additional Notes: Timeout performed. Patient sedated. Relevant anatomy ID'd using [place]. Incremental 2-5ml injection of LA with frequent aspiration. Patient tolerated procedure well.     Right Pectoralis Block

## 2022-10-14 ENCOUNTER — Encounter (HOSPITAL_COMMUNITY): Payer: Self-pay | Admitting: General Surgery

## 2022-10-14 DIAGNOSIS — C50811 Malignant neoplasm of overlapping sites of right female breast: Secondary | ICD-10-CM | POA: Diagnosis not present

## 2022-10-14 DIAGNOSIS — C50911 Malignant neoplasm of unspecified site of right female breast: Secondary | ICD-10-CM | POA: Diagnosis not present

## 2022-10-14 MED ORDER — PROPRANOLOL HCL 20 MG PO TABS: 10.0000 mg | ORAL_TABLET | Freq: Three times a day (TID) | ORAL | Status: DC

## 2022-10-14 MED ORDER — PROPRANOLOL HCL 20 MG PO TABS
10.0000 mg | ORAL_TABLET | Freq: Three times a day (TID) | ORAL | Status: DC
  Administered 2022-10-14: 10 mg via ORAL
  Filled 2022-10-14: qty 1

## 2022-10-14 MED ORDER — CARBIDOPA-LEVODOPA 25-100 MG PO TABS
1.0000 | ORAL_TABLET | Freq: Three times a day (TID) | ORAL | Status: DC
  Administered 2022-10-14: 1 via ORAL
  Filled 2022-10-14: qty 1

## 2022-10-14 MED ORDER — CARBIDOPA-LEVODOPA 25-100 MG PO TABS: 1.0000 | ORAL_TABLET | Freq: Three times a day (TID) | ORAL | Status: DC

## 2022-10-14 MED ORDER — METHOCARBAMOL 500 MG PO TABS: 500.0000 mg | ORAL_TABLET | Freq: Three times a day (TID) | ORAL | 0 refills | Status: DC | PRN

## 2022-10-14 NOTE — Anesthesia Postprocedure Evaluation (Signed)
Anesthesia Post Note  Patient: Daria Pastures  Procedure(s) Performed: RIGHT MASTECTOMY WITH AXILLARY SENTINEL LYMPH NODE BIOPSY (Right: Breast)     Patient location during evaluation: PACU Anesthesia Type: General Level of consciousness: awake and alert, oriented and patient cooperative Pain management: pain level controlled Vital Signs Assessment: post-procedure vital signs reviewed and stable Respiratory status: spontaneous breathing, nonlabored ventilation and respiratory function stable Cardiovascular status: blood pressure returned to baseline and stable Postop Assessment: no apparent nausea or vomiting Anesthetic complications: no   No notable events documented.  Last Vitals:  Vitals:   10/14/22 0440 10/14/22 0903  BP: 100/63 110/62  Pulse: 61 66  Resp:  17  Temp: 36.6 C 36.7 C  SpO2: 94% 95%    Last Pain:  Vitals:   10/14/22 0903  TempSrc: Oral  PainSc:                  Lannie Fields

## 2022-10-14 NOTE — Progress Notes (Signed)
1 Day Post-Op   Subjective/Chief Complaint: Dumped a lot of fluid overnight and this am but less now, pain controlled, has been up , tol diet   Objective: Vital signs in last 24 hours: Temp:  [97.6 F (36.4 C)-98.1 F (36.7 C)] 98.1 F (36.7 C) (05/22 0903) Pulse Rate:  [56-66] 66 (05/22 0903) Resp:  [11-23] 17 (05/22 0903) BP: (94-163)/(58-85) 110/62 (05/22 0903) SpO2:  [92 %-99 %] 95 % (05/22 0903) Weight:  [67.1 kg] 67.1 kg (05/21 1102) Last BM Date : 10/12/22  Intake/Output from previous day: 05/21 0701 - 05/22 0700 In: 900.6 [I.V.:900.6] Out: 325 [Drains:225; Blood:100] Intake/Output this shift: Total I/O In: 120 [P.O.:120] Out: -   General nad Cv regular Drain serosanguinous (thin), inferior flap with some ecchymosis and small amount hematoma, rest without any hematoma  Anti-infectives: Anti-infectives (From admission, onward)    Start     Dose/Rate Route Frequency Ordered Stop   10/13/22 1115  ceFAZolin (ANCEF) IVPB 2g/100 mL premix        2 g 200 mL/hr over 30 Minutes Intravenous On call to O.R. 10/13/22 1100 10/13/22 1333       Assessment/Plan: POD 1 right mastectomy/sn biopsy -I don't think she is bleeding currently. Had a lot of fluid out but thin, not concerned about exam right now either -if does well during the day today-needs to ambulate- she can likely go home later today -pathology pending   Jodi Medina 10/14/2022

## 2022-10-14 NOTE — Discharge Instructions (Signed)
[facility], [place] [phone]  MASTECTOMY: POST OP INSTRUCTIONS Take 400 mg of ibuprofen every 8 hours or 650 mg tylenol every 6 hours for next 72 hours then as needed. Use ice several times daily also.  A prescription for pain medication may be given to you upon discharge.  Take your pain medication as prescribed, if needed.  If narcotic pain medicine is not needed, then you may take acetaminophen (Tylenol), naprosyn (Alleve) or ibuprofen (Advil) as needed. Take your usually prescribed medications unless otherwise directed. If you need a refill on your pain medication, please contact your pharmacy.  They will contact our office to request authorization.  Prescriptions will not be filled after 5pm or on week-ends. You should follow a light diet the first 24 hours after surgery.  Resume your normal diet the day after surgery. Most patients will experience some swelling and bruising on the chest and underarm.  Ice packs will help.  Swelling and bruising can take several days to resolve. Wear the bra for 72 hours day and night. After night please at least wear during the day. You may shower with drain in place 48 hours after surgery It is common to experience some constipation if taking pain medication after surgery.  Increasing fluid intake and taking a stool softener (such as Colace) will usually help or prevent this problem from occurring.  A mild laxative (Milk of Magnesia or Miralax) should be taken according to package instructions if there are no bowel movements after 48 hours. There is glue and steristrips on your incision. They will come off in the next few weeks.  You may take a shower 48 hours after surgery.  Any sutures will be removed at an office visit DRAINS:  If you have drains in place, it is important to keep a list of the amount of drainage produced each day in your drains.  Before leaving the hospital, you should be instructed on drain care.  Call our office if you have  any questions about your drains. I will remove your drains when they put out less than 30 cc or ml for 2 consecutive days. ACTIVITIES:  You may resume regular (light) daily activities beginning the next day--such as daily self-care, walking, climbing stairs--gradually increasing activities as tolerated.  Refrain from any heavy lifting or straining until approved by your doctor. You may drive when you are no longer taking prescription pain medication, you can comfortably wear a seatbelt, and you can safely maneuver your car and apply brakes. RETURN TO WORK:  __________________________________________________________ Bonita Quin should see your doctor in the office for a follow-up appointment approximately 3-5 days after your surgery.  Your doctor's nurse will typically make your follow-up appointment when she calls you with your pathology report.  Expect your pathology report 3-4business days after surgery. OTHER INSTRUCTIONS: ______________________________________________________________________________________________ ____________________________________________________________________________________________ WHEN TO CALL YOUR DR Lile Mccurley: Fever over 101.0 Nausea and/or vomiting Extreme swelling or bruising Continued bleeding from incision. Increased pain, redness, or drainage from the incision. The clinic staff is available to answer your questions during regular business hours.  Please don't hesitate to call and ask to speak to one of the nurses for clinical concerns.  If you have a medical emergency, go to the nearest emergency room or call 911.  A surgeon from [facility] is always on call at the hospital. [address], [place], [place]  [address] ? [address] [address], [place], [place]   [address] [phone] ? [phone] ? [phone] Web site: [email or web address]

## 2022-10-14 NOTE — Care Management Obs Status (Signed)
MEDICARE OBSERVATION STATUS NOTIFICATION   Patient Details  Name: Jodi Medina MRN: [id number] Date of Birth: 1941/05/23   Medicare Observation Status Notification Given:       Harriet Masson, RN 10/14/2022, 2:51 PM

## 2022-10-15 NOTE — Discharge Summary (Signed)
Physician Discharge Summary  Patient ID: Jodi Medina MRN: [id number] DOB/AGE: 1941/05/25 82 y.o.  Admit date: 10/13/2022 Discharge date: 10/15/2022  Admission Diagnoses: Right breast cancer  Discharge Diagnoses:  Principal Problem:   S/P mastectomy, right   Discharged Condition: good  Hospital Course: 42 yof who underwent right mastectomy and sn biopsy. Doing well following day and ready for dc  Consults: None  Significant Diagnostic Studies: none  Treatments: surgery  Discharge Exam: Blood pressure 110/62, pulse 66, temperature 98.1 F (36.7 C), temperature source Oral, resp. rate 17, height 5\' 3"  (1.6 m), weight 67.1 kg, SpO2 95 %. Ecchymosis over inferior flap, minimal fluid, drain serosang as expected  Disposition: Discharge disposition: 01-Home or Self Care        Allergies as of 10/14/2022       Reactions   Lactose Intolerance (gi)         Medication List     TAKE these medications    alendronate 70 MG tablet Commonly known as: FOSAMAX Take 70 mg by mouth every 7 (seven) days. Take with a full glass of water on an empty stomach.   Align 4 MG Caps Take 4 mg by mouth daily.   aspirin EC 81 MG tablet Take 81 mg by mouth daily. Swallow whole.   atorvastatin 10 MG tablet Commonly known as: LIPITOR Take 10 mg by mouth daily.   BENEFIBER DRINK MIX PO Take by mouth daily.   CALCIUM CITRATE-VITAMIN D3 PO Take 200 mg by mouth daily.   carbidopa-levodopa 25-100 MG tablet Commonly known as: SINEMET IR Take 1 tablet by mouth 3 (three) times daily.   carboxymethylcellulose 1 % ophthalmic solution Place 1 drop into both eyes 2 (two) times daily as needed (Dry eyes). Refresh   cholecalciferol 25 MCG (1000 UNIT) tablet Commonly known as: VITAMIN D3 Take 1,000 Units by mouth daily.   clobetasol 0.05 % external solution Commonly known as: TEMOVATE Apply 1 Application topically daily as needed (itching scalp).   Co Q 10 100 MG Caps Take 100 mg  by mouth.   diclofenac Sodium 1 % Gel Commonly known as: VOLTAREN Apply 4 g topically 4 (four) times daily. What changed:  when to take this reasons to take this   lactase 3000 units tablet Commonly known as: LACTAID Take 3,000 Units by mouth daily as needed (lactose).   methocarbamol 500 MG tablet Commonly known as: ROBAXIN Take 1 tablet (500 mg total) by mouth every 8 (eight) hours as needed for muscle spasms.   propranolol 10 MG tablet Commonly known as: INDERAL TAKE 1 TABLET(10 MG) BY MOUTH THREE TIMES DAILY AS NEEDED What changed: See the new instructions.   vitamin C 250 MG tablet Commonly known as: ASCORBIC ACID Take 250 mg by mouth daily.        Follow-up Information     [name], MD Follow up in 2 week(s).   Specialty: General Surgery Contact information: [address] [place] [address] [phone]                 Signed: [name] 10/15/2022, 8:08 AM

## 2022-10-20 LAB — SURGICAL PATHOLOGY

## 2022-10-21 ENCOUNTER — Encounter: Payer: Self-pay | Admitting: *Deleted

## 2022-10-21 DIAGNOSIS — G250 Essential tremor: Secondary | ICD-10-CM | POA: Diagnosis not present

## 2022-10-21 DIAGNOSIS — Z9682 Presence of neurostimulator: Secondary | ICD-10-CM | POA: Diagnosis not present

## 2022-10-21 DIAGNOSIS — G20A1 Parkinson's disease without dyskinesia, without mention of fluctuations: Secondary | ICD-10-CM | POA: Diagnosis not present

## 2022-10-21 DIAGNOSIS — Z9689 Presence of other specified functional implants: Secondary | ICD-10-CM | POA: Diagnosis not present

## 2022-11-02 ENCOUNTER — Other Ambulatory Visit: Payer: Medicare PPO

## 2022-11-04 ENCOUNTER — Encounter: Payer: Self-pay | Admitting: *Deleted

## 2022-11-04 ENCOUNTER — Telehealth: Payer: Self-pay | Admitting: *Deleted

## 2022-11-04 DIAGNOSIS — Z170 Estrogen receptor positive status [ER+]: Secondary | ICD-10-CM

## 2022-11-04 NOTE — Telephone Encounter (Signed)
Spoke with patient to follow up regarding oncotype. Patient does not want this test done. She is aware of her follow up with Dr. Al Pimple 6/13. Encouraged her to call should she have any future questions.

## 2022-11-05 ENCOUNTER — Other Ambulatory Visit: Payer: Self-pay

## 2022-11-05 ENCOUNTER — Inpatient Hospital Stay: Payer: Medicare PPO | Attending: Hematology and Oncology | Admitting: Hematology and Oncology

## 2022-11-05 VITALS — BP 128/75 | HR 69 | Temp 97.7°F | Resp 18 | Ht 63.0 in | Wt 144.6 lb

## 2022-11-05 DIAGNOSIS — Z85828 Personal history of other malignant neoplasm of skin: Secondary | ICD-10-CM | POA: Insufficient documentation

## 2022-11-05 DIAGNOSIS — Z818 Family history of other mental and behavioral disorders: Secondary | ICD-10-CM | POA: Diagnosis not present

## 2022-11-05 DIAGNOSIS — C50811 Malignant neoplasm of overlapping sites of right female breast: Secondary | ICD-10-CM | POA: Insufficient documentation

## 2022-11-05 DIAGNOSIS — Z8349 Family history of other endocrine, nutritional and metabolic diseases: Secondary | ICD-10-CM | POA: Insufficient documentation

## 2022-11-05 DIAGNOSIS — Z170 Estrogen receptor positive status [ER+]: Secondary | ICD-10-CM | POA: Insufficient documentation

## 2022-11-05 DIAGNOSIS — Z800 Family history of malignant neoplasm of digestive organs: Secondary | ICD-10-CM | POA: Insufficient documentation

## 2022-11-05 DIAGNOSIS — N6322 Unspecified lump in the left breast, upper inner quadrant: Secondary | ICD-10-CM | POA: Insufficient documentation

## 2022-11-05 DIAGNOSIS — Z8042 Family history of malignant neoplasm of prostate: Secondary | ICD-10-CM | POA: Insufficient documentation

## 2022-11-05 DIAGNOSIS — Z8249 Family history of ischemic heart disease and other diseases of the circulatory system: Secondary | ICD-10-CM | POA: Insufficient documentation

## 2022-11-05 DIAGNOSIS — Z803 Family history of malignant neoplasm of breast: Secondary | ICD-10-CM | POA: Diagnosis not present

## 2022-11-05 DIAGNOSIS — Z823 Family history of stroke: Secondary | ICD-10-CM | POA: Diagnosis not present

## 2022-11-05 DIAGNOSIS — Z8262 Family history of osteoporosis: Secondary | ICD-10-CM | POA: Insufficient documentation

## 2022-11-05 DIAGNOSIS — Z79899 Other long term (current) drug therapy: Secondary | ICD-10-CM | POA: Diagnosis not present

## 2022-11-05 MED ORDER — ANASTROZOLE 1 MG PO TABS: 1.0000 mg | ORAL_TABLET | Freq: Every day | ORAL | 3 refills | Status: DC

## 2022-11-05 NOTE — Progress Notes (Signed)
Jodi Medina  Patient Care Team: Jodi Ishihara, MD as PCP - General (Internal Medicine) Jodi Proud, RN as Oncology Nurse Navigator Jodi Angelica, RN as Oncology Nurse Navigator Jodi Moulds, MD as Consulting Physician (Hematology and Oncology) Jodi Loron, MD as Consulting Physician (General Surgery) Jodi Puffer, MD as Consulting Physician (Radiation Oncology)  CHIEF COMPLAINTS/PURPOSE OF CONSULTATION:  New diagnosis of breast cancer  ASSESSMENT & PLAN:  This is a very pleasant 82 year old female patient with past medical history significant for diverticulitis and parkinsonism referred to breast [facility] for additional recommendations given new diagnosis of right-sided breast cancer.  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  She will not be proceeding with adjuvant radiation.  She declined Oncotype DX testing.  She is here to talk about antiestrogen therapy but appears to be very hesitant to try something.  We have today discussed about options for antiestrogen therapy including tamoxifen versus aromatase inhibitors.  Given her relatively sedentary habitus, we have focused our discussion mostly on aromatase inhibitors.  I have reviewed the mechanism of action, adverse effects including but not limited to postmenopausal symptoms such as hot flashes, vaginal dryness, arthralgias, bone density loss.  She is already on Fosamax for osteopenia with fracture hence osteoporosis.  I have recommended that she try it for 3 months and return to clinic for follow-up.  If she cannot tolerate it because of side effects, we can consider switching.  She is also given the option of just proceeding  with surveillance.  She is willing to try the medication.   All her questions were answered to the best my knowledge.  Thank you for consulting [place] in the care of this patient.  Please not hesitate to contact us with any additional questions or concerns.   HISTORY OF PRESENTING ILLNESS:  Jodi Medina 82 y.o. female is here because of new diagnosis of breast cancer  This is a very pleasant 82 year old female patient with new diagnosis of right breast cancer.  Oncology History  Malignant neoplasm of overlapping sites of right female breast (HCC)  09/15/2022 Mammogram   Patient had bilateral diagnostic mammogram given palpable lump and associated right nipple inversion for several months.  This showed highly suspicious right breast mass at 12 o'clock position 3 cm from the nipple and 1 o'clock position 3 cm from the nipple.  Recommend ultrasound-guided biopsy.  There is a questionable third focal mass at 11:00 3 cm from the nipple.  Additional multicystic mass extending to the nipple at 11 o'clock position 1 cm from the nipple which may represent a focally dilated duct.  No suspicious right axillary lymphadenopathy.  No mammographic evidence of malignancy on the left.   09/22/2022 Pathology Results   Right breast needle core biopsy showed invasive mammary carcinoma, identified as lobular based on loss of E-cadherin, the 1:00 mass 3 cm from the nipple is grade 2 ER/PR positive HER2 negative Ki-67 of 20%.  The right breast needle core biopsy at 12:00 3 cm from the nipple also identified as invasive lobular, high-grade ER positive PR negative HER2 negative with Ki-67 of 5%   09/28/2022 Initial Diagnosis   Malignant neoplasm of overlapping sites of right female breast (HCC)   09/28/2022 Cancer Staging   Staging  form: Breast, AJCC 8th Edition - Clinical stage from 09/28/2022: Stage IIA (cT2, cN0, cM0, G3, ER+, PR+, HER2-) - Signed by Ronny Bacon, PA-C on 09/28/2022 Stage prefix: Initial  diagnosis Method of lymph node assessment: Clinical Histologic grading system: 3 grade system    Patient arrived to the appointment today after mastectomy.  She says she is healing well, the surgeon is also very happy with the recovery so far.  Her daughter and her husband were with her today.  REVIEW OF SYSTEMS:   Constitutional: Denies fevers, chills or abnormal night sweats Eyes: Denies blurriness of vision, double vision or watery eyes Ears, nose, mouth, throat, and face: Denies mucositis or sore throat Respiratory: Denies cough, dyspnea or wheezes Cardiovascular: Denies palpitation, chest discomfort or lower extremity swelling Gastrointestinal:  Denies nausea, heartburn or change in bowel habits Skin: Denies abnormal skin rashes Lymphatics: Denies new lymphadenopathy or easy bruising Neurological:Denies numbness, tingling or new weaknesses Behavioral/Psych: Mood is stable, no new changes  All other systems were reviewed with the patient and are negative.  MEDICAL HISTORY:  Past Medical History:  Diagnosis Date   Arthritis    Breast cancer (HCC)    Diverticulitis    High cholesterol    High triglycerides    Lactose intolerance    Skin cancer    Torn meniscus    bilateral   Tremor     SURGICAL HISTORY: Past Surgical History:  Procedure Laterality Date   BREAST BIOPSY Right 09/22/2022   [place] RT BREAST BX W LOC DEV 1ST LESION IMG BX SPEC US GUIDE 09/22/2022 [facility]   BREAST BIOPSY Right 09/22/2022   [place] RT BREAST BX W LOC DEV EA ADD LESION IMG BX SPEC US GUIDE 09/22/2022 [facility]   BREAST EXCISIONAL BIOPSY Left 1990   CATARACT EXTRACTION Right 05/2022   CATARACT EXTRACTION Left 08/2022   COLONOSCOPY  2019   DEEP BRAIN STIMULATOR PLACEMENT  01/2021   DEEP BRAIN STIMULATOR PLACEMENT  02/2021   DENTAL SURGERY  2017   DILATION AND CURETTAGE OF UTERUS  1990   INJECTION KNEE Right 2020   "gel"   MASTECTOMY W/ SENTINEL NODE BIOPSY Right 10/13/2022    Procedure: RIGHT MASTECTOMY WITH AXILLARY SENTINEL LYMPH NODE BIOPSY;  Surgeon: Jodi Loron, MD;  Location: [facility] OR;  Service: General;  Laterality: Right;   microscopic knee surgery Left 12/2017   for torn meniscus   MULTIPLE TOOTH EXTRACTIONS  07/2022   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   skin cancer removal  1990s   TONSILLECTOMY     as a child   WISDOM TOOTH EXTRACTION      SOCIAL HISTORY: Social History   Socioeconomic History   Marital status: Married    Spouse name: Not on file   Number of children: 3   Years of education: Not on file   Highest education level: Master's degree (e.g., MA, MS, MEng, MEd, MSW, MBA)  Occupational History   Not on file  Tobacco Use   Smoking status: Never   Smokeless tobacco: Never  Vaping Use   Vaping Use: Never used  Substance and Sexual Activity   Alcohol use: Yes    Comment: socially   Drug use: No   Sexual activity: Not on file  Other Topics Concern   Not on file  Social History Narrative   Married, lives at home with husband   Daughter in [place]- 1 grandson   Daughter in [place]- no children  Son- IT works on Community education officer, travel in an RV, lives in [place] degree   Retired Therapist, sports.      Right handed   Caffeine: 1 cup decaf coffee or herbal tea in the mornings; 2 cups daily in the winter of decaf.   Social Determinants of Health   Financial Resource Strain: Not on file  Food Insecurity: Not on file  Transportation Needs: Not on file  Physical Activity: Not on file  Stress: Not on file  Social Connections: Not on file  Intimate Partner Violence: Not on file    FAMILY HISTORY: Family History  Problem Relation Age of Onset   Hyperlipidemia Mother    Tremor Mother    Osteoporosis Mother    Dementia Mother    Heart failure Mother    Alzheimer's disease Father    Prostate cancer Brother        dx >45   Breast cancer Maternal Aunt        dx 35s   Heart disease  Maternal Uncle    Heart disease Paternal Uncle    Stroke Maternal Grandmother    Melanoma Daughter        dx 62s; leg; s/p excision   Colon cancer Nephew 58       metastatic    ALLERGIES:  is allergic to lactose intolerance (gi).  MEDICATIONS:  Current Outpatient Medications  Medication Sig Dispense Refill   alendronate (FOSAMAX) 70 MG tablet Take 70 mg by mouth every 7 (seven) days. Take with a full glass of water on an empty stomach.     aspirin EC 81 MG tablet Take 81 mg by mouth daily. Swallow whole.     atorvastatin (LIPITOR) 10 MG tablet Take 10 mg by mouth daily.     CALCIUM CITRATE-VITAMIN D3 PO Take 200 mg by mouth daily.     carbidopa-levodopa (SINEMET IR) 25-100 MG tablet Take 1 tablet by mouth 3 (three) times daily.     carboxymethylcellulose 1 % ophthalmic solution Place 1 drop into both eyes 2 (two) times daily as needed (Dry eyes). Refresh     cholecalciferol (VITAMIN D3) 25 MCG (1000 UNIT) tablet Take 1,000 Units by mouth daily.     clobetasol (TEMOVATE) 0.05 % external solution Apply 1 Application topically daily as needed (itching scalp).     Coenzyme Q10 (CO Q 10) 100 MG CAPS Take 100 mg by mouth.     diclofenac Sodium (VOLTAREN) 1 % GEL Apply 4 g topically 4 (four) times daily. (Patient taking differently: Apply 4 g topically daily as needed (Arthritis).) 100 g 0   lactase (LACTAID) 3000 units tablet Take 3,000 Units by mouth daily as needed (lactose).     methocarbamol (ROBAXIN) 500 MG tablet Take 1 tablet (500 mg total) by mouth every 8 (eight) hours as needed for muscle spasms. 10 tablet 0   Probiotic Product (ALIGN) 4 MG CAPS Take 4 mg by mouth daily.     propranolol (INDERAL) 10 MG tablet TAKE 1 TABLET(10 MG) BY MOUTH THREE TIMES DAILY AS NEEDED (Patient taking differently: Take 10 mg by mouth 3 (three) times daily.) 270 tablet 4   vitamin C (ASCORBIC ACID) 250 MG tablet Take 250 mg by mouth daily.     Wheat Dextrin (BENEFIBER DRINK MIX PO) Take by mouth daily.      No current facility-administered medications for this visit.     PHYSICAL EXAMINATION: ECOG PERFORMANCE STATUS: 0 - Asymptomatic  Vitals:  11/05/22 1401  BP: 128/75  Pulse: 69  Resp: 18  Temp: 97.7 F (36.5 C)  SpO2: 95%   Filed Weights   11/05/22 1401  Weight: 144 lb 9.6 oz (65.6 kg)   GENERAL:alert, no distress and comfortable  LABORATORY DATA:  I have reviewed the data as listed Lab Results  Component Value Date   WBC 7.4 09/30/2022   HGB 14.4 09/30/2022   HCT 42.3 09/30/2022   MCV 90.0 09/30/2022   PLT 155 09/30/2022     Chemistry      Component Value Date/Time   NA 136 09/30/2022 0825   K 4.4 09/30/2022 0825   CL 103 09/30/2022 0825   CO2 28 09/30/2022 0825   BUN 16 09/30/2022 0825   CREATININE 0.67 09/30/2022 0825      Component Value Date/Time   CALCIUM 9.4 09/30/2022 0825   ALKPHOS 42 09/30/2022 0825   AST 15 09/30/2022 0825   ALT <5 09/30/2022 0825   BILITOT 0.8 09/30/2022 0825       RADIOGRAPHIC STUDIES: I have personally reviewed the radiological images as listed and agreed with the findings in the report. No results found.  All questions were answered. The patient knows to call the clinic with any problems, questions or concerns. I spent 30 minutes in the care of this patient including H and P, review of records, counseling and coordination of care.     Jodi Moulds, MD 11/05/2022 2:03 PM

## 2022-11-06 ENCOUNTER — Ambulatory Visit: Payer: Medicare PPO | Attending: General Surgery | Admitting: Physical Therapy

## 2022-11-06 DIAGNOSIS — M25611 Stiffness of right shoulder, not elsewhere classified: Secondary | ICD-10-CM | POA: Diagnosis not present

## 2022-11-06 DIAGNOSIS — C50211 Malignant neoplasm of upper-inner quadrant of right female breast: Secondary | ICD-10-CM | POA: Insufficient documentation

## 2022-11-06 DIAGNOSIS — R293 Abnormal posture: Secondary | ICD-10-CM | POA: Diagnosis not present

## 2022-11-06 DIAGNOSIS — Z170 Estrogen receptor positive status [ER+]: Secondary | ICD-10-CM | POA: Diagnosis not present

## 2022-11-06 DIAGNOSIS — Z483 Aftercare following surgery for neoplasm: Secondary | ICD-10-CM | POA: Diagnosis not present

## 2022-11-06 NOTE — Therapy (Signed)
OUTPATIENT PHYSICAL THERAPY BREAST CANCER BASELINE EVALUATION   Patient Name: Jodi Medina MRN: [id number] DOB:1940/11/23, 82 y.o., female Today's Date: 11/06/2022  END OF SESSION:  PT End of Session - 11/06/22 0957     Visit Number 2    Number of Visits 10    Date for PT Re-Evaluation 01/06/23    PT Start Time 0900    PT Stop Time 0945    PT Time Calculation (min) 45 min    Activity Tolerance Patient tolerated treatment well    Behavior During Therapy WFL for tasks assessed/performed              Past Medical History:  Diagnosis Date   Arthritis    Breast cancer (HCC)    Diverticulitis    High cholesterol    High triglycerides    Lactose intolerance    Skin cancer    Torn meniscus    bilateral   Tremor    Past Surgical History:  Procedure Laterality Date   BREAST BIOPSY Right 09/22/2022   [place] RT BREAST BX W LOC DEV 1ST LESION IMG BX SPEC US GUIDE 09/22/2022 [facility]   BREAST BIOPSY Right 09/22/2022   [place] RT BREAST BX W LOC DEV EA ADD LESION IMG BX SPEC US GUIDE 09/22/2022 [facility]   BREAST EXCISIONAL BIOPSY Left 1990   CATARACT EXTRACTION Right 05/2022   CATARACT EXTRACTION Left 08/2022   COLONOSCOPY  2019   DEEP BRAIN STIMULATOR PLACEMENT  01/2021   DEEP BRAIN STIMULATOR PLACEMENT  02/2021   DENTAL SURGERY  2017   DILATION AND CURETTAGE OF UTERUS  1990   INJECTION KNEE Right 2020   "gel"   MASTECTOMY W/ SENTINEL NODE BIOPSY Right 10/13/2022   Procedure: RIGHT MASTECTOMY WITH AXILLARY SENTINEL LYMPH NODE BIOPSY;  Surgeon: [name], MD;  Location: [facility];  Service: General;  Laterality: Right;   microscopic knee surgery Left 12/2017   for torn meniscus   MULTIPLE TOOTH EXTRACTIONS  07/2022   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 [name]  REFERRING DIAG: Right breast cancer  THERAPY DIAG:  Malignant neoplasm of upper-inner quadrant of right breast in female, estrogen receptor positive (HCC)  Abnormal posture  Stiffness of right shoulder, not elsewhere classified  Aftercare following surgery for neoplasm  Rationale for Evaluation and Treatment: Rehabilitation  ONSET DATE: 09/18/2022  SUBJECTIVE:  SUBJECTIVE STATEMENT: "I'm still  stiff.  I just got the drains out about a week ago. "I still have a flap there." [facility] on [facility].    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 It is ER positive in 1 mass and ER negative in another mass. Both areas are PR positive and HER2 negative with a Ki67 of 5-20%. She has Parkinson's and a deep brain stimulator for reducing tremors. She also has a hx of cerebellar CVA. On 5/ 21/2014 she had right breast mastectomy with removal of  2 lymph nodes negative for metastatic carcinoma. She will not be proceeding with adjuvant radiaiton   PATIENT GOALS:   reduce  lymphedema risk and learn post op HEP.   PAIN:  Are you having pain? no  PRECAUTIONS: at risk for lymphedema  HAND DOMINANCE: right  WEIGHT BEARING RESTRICTIONS: No  FALLS:  Has patient fallen in last 6 months? No  LIVING ENVIRONMENT: Patient lives with: her husband Lives in: House/apartment Has following equipment at home: None  OCCUPATION: Retired  LEISURE: She walks twice a day to take her dog for walks  PRIOR LEVEL OF FUNCTION: Independent   OBJECTIVE: OBSERVATION :  pt with well approximated healing incsion across right chest.  She has an area of fullness at lateral aspect with incision deep inside a skin fold.  There is no drainage or odor. Pt instructed to adjust compression bra so that is covers and does not "dig in" to this area and limit the decongestion of the skin fold.       COGNITION: Overall cognitive status: Within functional limits for tasks assessed    POSTURE:  Forward head and rounded shoulders posture  UPPER EXTREMITY AROM/PROM:  A/PROM RIGHT   eval  Right  11/06/2022  Shoulder extension 46   Shoulder flexion 135 135  Shoulder abduction 165 90  Shoulder internal rotation 71 60  Shoulder external rotation 79 90    (Blank rows = not tested)  A/PROM LEFT   eval  Shoulder extension 36  Shoulder flexion 143  Shoulder abduction 178  Shoulder internal rotation 75  Shoulder external rotation 85    (Blank rows = not tested)  CERVICAL AROM: All within normal limits  UPPER EXTREMITY STRENGTH: WFL   LYMPHEDEMA ASSESSMENTS:   LANDMARK RIGHT   eval Right 11/06/2022  10 cm proximal to olecranon process 27 27  Olecranon process 24.6 24.5  10 cm proximal to ulnar styloid process 22.7 23  Just proximal to ulnar styloid process 16.2 16.3  Across hand at thumb web space 17.3 17.5  At base of 2nd digit 5.9 5.9  (Blank rows = not tested)  LANDMARK LEFT   eval  10 cm proximal to olecranon process 26.5  Olecranon process 24.9  10 cm  proximal to ulnar styloid process 22.5  Just proximal to ulnar styloid process 15.8  Across hand at thumb web space 16.9  At base of 2nd digit 5.9  (Blank rows = not tested)  L-DEX LYMPHEDEMA SCREENING:     QUICK DASH SURVEY:     PATIENT EDUCATION:  Education details: Lymphedema risk reduction and post op  shoulder/posture HEP Person educated: Patient Education method: Explanation, Demonstration, Handout Education comprehension: Patient verbalized understanding and returned demonstration  HOME EXERCISE PROGRAM: Patient was instructed today in a home exercise program today for post op shoulder range of motion. These included active assist shoulder flexion in sitting, scapular retraction, wall walking with shoulder abduction, and hands behind head external rotation.  She was encouraged to do these twice a day, holding 3 seconds and repeating 5 times when permitted by her physician. TODAY'S TREATMENT:   Reassessment post op and discussion with pt that she would benefit from PT to regain ROM and strength as she wants it to recover as it is her "good arm" that doesn't have a tremor.  She is healing well, but does have limited ROM especially in abduction. She has a congested skin fold at axilla and compression bra was adjusted to cover that with compression.  Performed gentle active and active assisted exercise today in supine for  right shoulder external and internal rotation, elevation. Hand to ceiling form small controlled circles in each direction and scapular protraction and retraction.  Then to sideling for deltoid strengthening by lifting long arm  up about 30 degrees.  Pt did well with all exercise and is agreeable to continue PT   ASSESSMENT:  Pt is healing well from surgery but will benefit from PT to improved ROM and strength , promote decrease of post op fluid around incision so that she can return to previous function   CLINICAL IMPRESSION: Pt is healing well from surgery but will  benefit from PT to improve ROM and strength , promote decrease of post op fluid around incision so that she can return to previous function   Pt will benefit from skilled therapeutic intervention to improve on the following deficits: Decreased knowledge of precautions, impaired UE functional use, pain, decreased ROM, postural dysfunction.   PT treatment/interventions: ADL/self-care home management, pt/family education, therapeutic exercise  REHAB POTENTIAL: Excellent  CLINICAL DECISION MAKING: Stable/uncomplicated  EVALUATION COMPLEXITY: Low   GOALS: Goals reviewed with patient? YES  LONG TERM GOALS: (STG=LTG)    Name Target Date Goal status  1 Pt will be able to verbalize understanding of pertinent lymphedema risk reduction practices relevant to her dx specifically related to skin care.  Baseline:  No knowledge 11/06/2022 Achieved at eval  2 Pt will be able to return demo and/or verbalize understanding of the post op HEP related to regaining shoulder ROM. Baseline:  No knowledge 11/06/2022 Achieved at eval  3 Pt will be able to verbalize understanding of the importance of attending the post op After Breast CA Class for further lymphedema risk reduction education and therapeutic exercise.  Baseline:  No knowledge 11/06/2022 Achieved at eval  4 Pt will demo she has regained full shoulder ROM and function post operatively compared to baselines.  Baseline: See objective measurements taken today. 12/26/2023 Ongoing   5  Pt will be improve functional strength of her right arm so that she able to put dishes on the second shelf of her cabinet without pain  12/26/2022 New    PLAN:  PT FREQUENCY/DURATION: 2 times a week for 4 weeks  PLAN FOR NEXT SESSION: Therapeutic exercise for ROM and strength of right shoulder, manual work to decrease the fullness at left axilla, make sure pt is signed up for ABC class, perform Quick DASH score.    Patient Details  Name: Jodi Medina MRN: [id number] Date  of Birth: 04/05/1941 Referring Provider:  [name], MD  Encounter Date: 11/06/2022  Bayard Hugger. Manson Passey PT  Donnetta Hail, PT 11/06/2022, 11:25 AM  [facility] [address] [place], [place], [address] Phone: [phone]   Fax:  [phone]

## 2022-11-10 ENCOUNTER — Ambulatory Visit: Payer: Medicare PPO

## 2022-11-10 DIAGNOSIS — M25611 Stiffness of right shoulder, not elsewhere classified: Secondary | ICD-10-CM | POA: Diagnosis not present

## 2022-11-10 DIAGNOSIS — C50211 Malignant neoplasm of upper-inner quadrant of right female breast: Secondary | ICD-10-CM | POA: Diagnosis not present

## 2022-11-10 DIAGNOSIS — R293 Abnormal posture: Secondary | ICD-10-CM | POA: Diagnosis not present

## 2022-11-10 DIAGNOSIS — Z170 Estrogen receptor positive status [ER+]: Secondary | ICD-10-CM | POA: Diagnosis not present

## 2022-11-10 DIAGNOSIS — Z483 Aftercare following surgery for neoplasm: Secondary | ICD-10-CM | POA: Diagnosis not present

## 2022-11-10 NOTE — Therapy (Signed)
OUTPATIENT PHYSICAL THERAPY BREAST CANCER TREATMENT   Patient Name: Jodi Medina MRN: [id number] DOB:[birth year], 82 y.o., female Today's Date: 11/10/2022  END OF SESSION:  PT End of Session - 11/10/22 1011     Visit Number 3    Number of Visits 10    Date for PT Re-Evaluation 01/06/23    PT Start Time 1008    PT Stop Time 1104    PT Time Calculation (min) 56 min    Activity Tolerance Patient tolerated treatment well    Behavior During Therapy WFL for tasks assessed/performed              Past Medical History:  Diagnosis Date   Arthritis    Breast cancer (HCC)    Diverticulitis    High cholesterol    High triglycerides    Lactose intolerance    Skin cancer    Torn meniscus    bilateral   Tremor    Past Surgical History:  Procedure Laterality Date   BREAST BIOPSY Right 09/22/2022   [place] RT BREAST BX W LOC DEV 1ST LESION IMG BX SPEC US GUIDE 09/22/2022 [facility]   BREAST BIOPSY Right 09/22/2022   [place] RT BREAST BX W LOC DEV EA ADD LESION IMG BX SPEC US GUIDE 09/22/2022 [facility]   BREAST EXCISIONAL BIOPSY Left 1990   CATARACT EXTRACTION Right 05/2022   CATARACT EXTRACTION Left 08/2022   COLONOSCOPY  2019   DEEP BRAIN STIMULATOR PLACEMENT  01/2021   DEEP BRAIN STIMULATOR PLACEMENT  02/2021   DENTAL SURGERY  2017   DILATION AND CURETTAGE OF UTERUS  1990   INJECTION KNEE Right 2020   "gel"   MASTECTOMY W/ SENTINEL NODE BIOPSY Right 10/13/2022   Procedure: RIGHT MASTECTOMY WITH AXILLARY SENTINEL LYMPH NODE BIOPSY;  Surgeon: [name], MD;  Location: [facility] OR;  Service: General;  Laterality: Right;   microscopic knee surgery Left 12/2017   for torn meniscus   MULTIPLE TOOTH EXTRACTIONS  07/2022   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 [name]  REFERRING DIAG: Right breast cancer  THERAPY DIAG:  Malignant neoplasm of upper-inner quadrant of right breast in female, estrogen receptor positive (HCC)  Abnormal posture  Stiffness of right shoulder, not elsewhere classified  Aftercare following surgery for neoplasm  Rationale for Evaluation and Treatment: Rehabilitation  ONSET DATE: 09/18/2022  SUBJECTIVE:  SUBJECTIVE STATEMENT: I went to [facility] and they wouldn't give me the prosthesis because she was worried I wasn't healed enough with the skin flap so she didn't want to have me wear that yet thinking it would make me worse. But Dr. Dwain Sarna said I could wear whatever bra I wanted because I was healed.   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 It is ER positive in 1 mass and ER negative in another mass. Both areas are PR positive and HER2 negative with a Ki67 of 5-20%. She has Parkinson's and a deep brain stimulator for reducing tremors. She also has a hx of cerebellar CVA. On 5/ 21/2014 she had right breast mastectomy with removal of  2  lymph nodes negative for metastatic carcinoma. She will not be proceeding with adjuvant radiaiton   PATIENT GOALS:   reduce lymphedema risk and learn post op HEP.   PAIN:  Are you having pain? no  PRECAUTIONS: at risk for lymphedema  HAND DOMINANCE: right  WEIGHT BEARING RESTRICTIONS: No  FALLS:  Has patient fallen in last 6 months? No  LIVING ENVIRONMENT: Patient lives with: her husband Lives in: House/apartment Has following equipment at home: None  OCCUPATION: Retired  LEISURE: She walks twice a day to take her dog for walks  PRIOR LEVEL OF FUNCTION: Independent   OBJECTIVE: OBSERVATION :  pt with well approximated healing incsion across right chest.  She has an area of fullness at lateral aspect with incision deep inside a skin fold.  There is no drainage or odor. Pt instructed to adjust compression bra so that is covers and does not "dig in" to this area and limit the decongestion of the skin fold.       COGNITION: Overall cognitive status: Within functional limits for tasks assessed    POSTURE:  Forward head and rounded shoulders posture  UPPER EXTREMITY AROM/PROM:  A/PROM RIGHT   eval  Right  11/06/2022  Shoulder extension 46   Shoulder flexion 135 135  Shoulder abduction 165 90  Shoulder internal rotation 71 60  Shoulder external rotation 79 90    (Blank rows = not tested)  A/PROM LEFT   eval  Shoulder extension 36  Shoulder flexion 143  Shoulder abduction 178  Shoulder internal rotation 75  Shoulder external rotation 85    (Blank rows = not tested)  CERVICAL AROM: All within normal limits  UPPER EXTREMITY STRENGTH: WFL   LYMPHEDEMA ASSESSMENTS:   LANDMARK RIGHT   eval Right 11/06/2022  10 cm proximal to olecranon process 27 27  Olecranon process 24.6 24.5  10 cm proximal to ulnar styloid process 22.7 23  Just proximal to ulnar styloid process 16.2 16.3  Across hand at thumb web space 17.3 17.5  At base of 2nd digit 5.9 5.9  (Blank  rows = not tested)  LANDMARK LEFT   eval  10 cm proximal to olecranon process 26.5  Olecranon process 24.9  10 cm proximal to ulnar styloid process 22.5  Just proximal to ulnar styloid process 15.8  Across hand at thumb web space 16.9  At base of 2nd digit 5.9  (Blank rows = not tested)  L-DEX  LYMPHEDEMA SCREENING:     QUICK DASH SURVEY: 11/11/22: 36.36   TODAY'S TREATMENT:    11/11/22: Therapeutic Exercises Pulleys into flexion x 4 mins and then scaption x 1 min with pt returning correct demo and did well with decreasing scapular compensations after cuing Roll yellow ball up wall into flexion x 15 and then  Rt UE abd x 5 returning therapist demo for each Manual therapy P/ROM of Rt shoulder into flexion, abd and D2  to pts tolerance, multiple VC's initially to relax but this improved towards end of session and pt had improved motion MFR to Rt chest wall and cording in medial upper arm (very tender initially but this improved during session) during P/ROM  11/06/22: Reassessment post op and discussion with pt that she would benefit from PT to regain ROM and strength as she wants it to recover as it is her "good arm" that doesn't have a tremor.  She is healing well, but does have limited ROM especially in abduction. She has a congested skin fold at axilla and compression bra was adjusted to cover that with compression.  Performed gentle active and active assisted exercise today in supine for  right shoulder external and internal rotation, elevation. Hand to ceiling form small controlled circles in each direction and scapular protraction and retraction.  Then to sideling for deltoid strengthening by lifting long arm  up about 30 degrees.  Pt did well with all exercise and is agreeable to continue PT   Quick Dash - 11/10/22 0001     Open a tight or new jar Mild difficulty    Do heavy household chores (wash walls, wash floors) Moderate difficulty    Carry a shopping bag or briefcase No  difficulty    Wash your back Moderate difficulty    Use a knife to cut food Moderate difficulty    Recreational activities in which you take some force or impact through your arm, shoulder, or hand (golf, hammering, tennis) Severe difficulty    During the past week, to what extent has your arm, shoulder or hand problem interfered with your normal social activities with family, friends, neighbors, or groups? Slightly    During the past week, to what extent has your arm, shoulder or hand problem limited your work or other regular daily activities Modererately    Arm, shoulder, or hand pain. Moderate    Tingling (pins and needles) in your arm, shoulder, or hand None    Difficulty Sleeping Mild difficulty    DASH Score 36.36 %               PATIENT EDUCATION:  Education details: Lymphedema risk reduction and post op shoulder/posture HEP Person educated: Patient Education method: Explanation, Demonstration, Handout Education comprehension: Patient verbalized understanding and returned demonstration  HOME EXERCISE PROGRAM: Patient was instructed today in a home exercise program today for post op shoulder range of motion. These included active assist shoulder flexion in sitting, scapular retraction, wall walking with shoulder abduction, and hands behind head external rotation.  She was encouraged to do these twice a day, holding 3 seconds and repeating 5 times when permitted by her physician.   ASSESSMENT:  Pt is healing well from surgery but will benefit from PT to improved ROM and strength , promote decrease of post op fluid around incision so that she can return to previous function   CLINICAL IMPRESSION: First session of treatment today. Pt did well with AA/ROM stretches after VC's to remind pt to decrease  scapular compensations. She did require multiple VC's initially during P/ROM to relax due to muscle guarding but this was much improved by end of session and her P/ROM was much  improved and the cording wasn't as tender either.   Pt will benefit from skilled therapeutic intervention to improve on the following deficits: Decreased knowledge of precautions, impaired UE functional use, pain, decreased ROM, postural dysfunction.   PT treatment/interventions: ADL/self-care home management, pt/family education, therapeutic exercise  REHAB POTENTIAL: Excellent  CLINICAL DECISION MAKING: Stable/uncomplicated  EVALUATION COMPLEXITY: Low   GOALS: Goals reviewed with patient? YES  LONG TERM GOALS: (STG=LTG)    Name Target Date Goal status  1 Pt will be able to verbalize understanding of pertinent lymphedema risk reduction practices relevant to her dx specifically related to skin care.  Baseline:  No knowledge 11/10/2022 Achieved at eval  2 Pt will be able to return demo and/or verbalize understanding of the post op HEP related to regaining shoulder ROM. Baseline:  No knowledge 11/10/2022 Achieved at eval  3 Pt will be able to verbalize understanding of the importance of attending the post op After Breast CA Class for further lymphedema risk reduction education and therapeutic exercise.  Baseline:  No knowledge 11/10/2022 Achieved at eval  4 Pt will demo she has regained full shoulder ROM and function post operatively compared to baselines.  Baseline: See objective measurements taken today. 12/26/2023 Ongoing   5  Pt will be improve functional strength of her right arm so that she able to put dishes on the second shelf of her cabinet without pain  12/26/2022 New    PLAN:  PT FREQUENCY/DURATION: 2 times a week for 4 weeks   PLAN FOR NEXT SESSION: Cont Therapeutic exercise for ROM and strength of right shoulder, manual work to decrease the fullness at right axilla, make sure pt is signed up for ABC class     Hermenia Bers, PTA 11/10/2022, 12:47 PM  [facility] [address] [place], [place], [address] Phone:  [phone]   Fax:  [phone]

## 2022-11-12 ENCOUNTER — Encounter: Payer: Self-pay | Admitting: Rehabilitation

## 2022-11-12 ENCOUNTER — Ambulatory Visit: Payer: Medicare PPO | Admitting: Rehabilitation

## 2022-11-12 DIAGNOSIS — M25611 Stiffness of right shoulder, not elsewhere classified: Secondary | ICD-10-CM | POA: Diagnosis not present

## 2022-11-12 DIAGNOSIS — Z483 Aftercare following surgery for neoplasm: Secondary | ICD-10-CM | POA: Diagnosis not present

## 2022-11-12 DIAGNOSIS — R293 Abnormal posture: Secondary | ICD-10-CM

## 2022-11-12 DIAGNOSIS — C50211 Malignant neoplasm of upper-inner quadrant of right female breast: Secondary | ICD-10-CM

## 2022-11-12 DIAGNOSIS — Z170 Estrogen receptor positive status [ER+]: Secondary | ICD-10-CM | POA: Diagnosis not present

## 2022-11-12 NOTE — Therapy (Signed)
OUTPATIENT PHYSICAL THERAPY BREAST CANCER TREATMENT   Patient Name: Jodi Medina MRN: [id number] DOB:01-30-1941, 82 y.o., female Today's Date: 11/12/2022  END OF SESSION:  PT End of Session - 11/12/22 1059     Visit Number 4    Number of Visits 10    Date for PT Re-Evaluation 01/06/23    PT Start Time 1101    PT Stop Time 1147    PT Time Calculation (min) 46 min    Activity Tolerance Patient tolerated treatment well    Behavior During Therapy WFL for tasks assessed/performed              Past Medical History:  Diagnosis Date   Arthritis    Breast cancer (HCC)    Diverticulitis    High cholesterol    High triglycerides    Lactose intolerance    Skin cancer    Torn meniscus    bilateral   Tremor    Past Surgical History:  Procedure Laterality Date   BREAST BIOPSY Right 09/22/2022   [place] RT BREAST BX W LOC DEV 1ST LESION IMG BX SPEC US GUIDE 09/22/2022 [facility]   BREAST BIOPSY Right 09/22/2022   [place] RT BREAST BX W LOC DEV EA ADD LESION IMG BX SPEC US GUIDE 09/22/2022 [facility]   BREAST EXCISIONAL BIOPSY Left 1990   CATARACT EXTRACTION Right 05/2022   CATARACT EXTRACTION Left 08/2022   COLONOSCOPY  2019   DEEP BRAIN STIMULATOR PLACEMENT  01/2021   DEEP BRAIN STIMULATOR PLACEMENT  02/2021   DENTAL SURGERY  2017   DILATION AND CURETTAGE OF UTERUS  1990   INJECTION KNEE Right 2020   "gel"   MASTECTOMY W/ SENTINEL NODE BIOPSY Right 10/13/2022   Procedure: RIGHT MASTECTOMY WITH AXILLARY SENTINEL LYMPH NODE BIOPSY;  Surgeon: [name], MD;  Location: [facility] OR;  Service: General;  Laterality: Right;   microscopic knee surgery Left 12/2017   for torn meniscus   MULTIPLE TOOTH EXTRACTIONS  07/2022   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 [name]  REFERRING DIAG: Right breast cancer  THERAPY DIAG:  Malignant neoplasm of upper-inner quadrant of right breast in female, estrogen receptor positive (HCC)  Abnormal posture  Stiffness of right shoulder, not elsewhere classified  Aftercare following surgery for neoplasm  Rationale for Evaluation and Treatment: Rehabilitation  ONSET DATE: 09/18/2022  SUBJECTIVE:  SUBJECTIVE STATEMENT: My arm was a little looser after last time.  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 It is ER positive in 1 mass and ER negative in another mass. Both areas are PR positive and HER2 negative with a Ki67 of 5-20%. She has Parkinson's and a deep brain stimulator for reducing tremors. She also has a hx of cerebellar CVA. On 5/ 21/2014 she had right breast mastectomy with removal of  2 lymph nodes negative for metastatic carcinoma. She will not be proceeding with adjuvant radiaiton   PATIENT GOALS:   reduce lymphedema risk and learn post op HEP.   PAIN:  Are you having pain? no  PRECAUTIONS: at risk for  lymphedema   HAND DOMINANCE: right  WEIGHT BEARING RESTRICTIONS: No  FALLS:  Has patient fallen in last 6 months? No  LIVING ENVIRONMENT: Patient lives with: her husband Lives in: House/apartment Has following equipment at home: None  OCCUPATION: Retired  LEISURE: She walks twice a day to take her dog for walks  PRIOR LEVEL OF FUNCTION: Independent   OBJECTIVE: OBSERVATION :  pt with well approximated healing incsion across right chest.  She has an area of fullness at lateral aspect with incision deep inside a skin fold.  There is no drainage or odor. Pt instructed to adjust compression bra so that is covers and does not "dig in" to this area and limit the decongestion of the skin fold.       COGNITION: Overall cognitive status: Within functional limits for tasks assessed    POSTURE:  Forward head and rounded shoulders posture  UPPER EXTREMITY AROM/PROM:  A/PROM RIGHT   eval  Right  11/06/2022  Shoulder extension 46   Shoulder flexion 135 135  Shoulder abduction 165 90  Shoulder internal rotation 71 60  Shoulder external rotation 79 90    (Blank rows = not tested)  A/PROM LEFT   eval  Shoulder extension 36  Shoulder flexion 143  Shoulder abduction 178  Shoulder internal rotation 75  Shoulder external rotation 85    (Blank rows = not tested)  CERVICAL AROM: All within normal limits  UPPER EXTREMITY STRENGTH: WFL   LYMPHEDEMA ASSESSMENTS:   LANDMARK RIGHT   eval Right 11/06/2022  10 cm proximal to olecranon process 27 27  Olecranon process 24.6 24.5  10 cm proximal to ulnar styloid process 22.7 23  Just proximal to ulnar styloid process 16.2 16.3  Across hand at thumb web space 17.3 17.5  At base of 2nd digit 5.9 5.9  (Blank rows = not tested)  LANDMARK LEFT   eval  10 cm proximal to olecranon process 26.5  Olecranon process 24.9  10 cm proximal to ulnar styloid process 22.5  Just proximal to ulnar styloid process 15.8  Across hand at thumb  web space 16.9  At base of 2nd digit 5.9  (Blank rows = not tested)  QUICK DASH SURVEY: 11/11/22: 36.36   TODAY'S TREATMENT:    11/12/22: Therapeutic Exercises Pulleys into flexion x 4 mins and then scaption x 1 min with pt returning correct demo and did well with decreasing scapular compensations after cuing Roll yellow ball up  wall into flexion x 10 and then  Rt UE abd x 5 returning therapist demo for each Supine dowel flexion x 5 with initial instruction Supine chest stretch 5" x 5 Supine long arm circles x 10 c/cc Long arm shoulder alternating flexion x 5  Manual therapy P/ROM of Rt shoulder into flexion, abd and D2  to pts tolerance,  MFR to Rt chest wall and cording in medial upper arm   11/11/22: Therapeutic Exercises Pulleys into flexion x 4 mins and then scaption x 1 min with pt returning correct demo and did well with decreasing scapular compensations after cuing Roll yellow ball up wall into flexion x 15 and then  Rt UE abd x 5 returning therapist demo for each Manual therapy P/ROM of Rt shoulder into flexion, abd and D2  to pts tolerance, multiple VC's initially to relax but this improved towards end of session and pt had improved motion MFR to Rt chest wall and cording in medial upper arm (very tender initially but this improved during session) during P/ROM  11/06/22: Reassessment post op and discussion with pt that she would benefit from PT to regain ROM and strength as she wants it to recover as it is her "good arm" that doesn't have a tremor.  She is healing well, but does have limited ROM especially in abduction. She has a congested skin fold at axilla and compression bra was adjusted to cover that with compression.  Performed gentle active and active assisted exercise today in supine for  right shoulder external and internal rotation, elevation. Hand to ceiling form small controlled circles in each direction and scapular protraction and retraction.  Then to sideling for  deltoid strengthening by lifting long arm  up about 30 degrees.  Pt did well with all exercise and is agreeable to continue PT   PATIENT EDUCATION:  Education details: Lymphedema risk reduction and post op shoulder/posture HEP Person educated: Patient Education method: Explanation, Demonstration, Handout Education comprehension: Patient verbalized understanding and returned demonstration  HOME EXERCISE PROGRAM: Patient was instructed today in a home exercise program today for post op shoulder range of motion. These included active assist shoulder flexion in sitting, scapular retraction, wall walking with shoulder abduction, and hands behind head external rotation.  She was encouraged to do these twice a day, holding 3 seconds and repeating 5 times when permitted by her physician.   ASSESSMENT:  Pt is healing well from surgery but will benefit from PT to improved ROM and strength , promote decrease of post op fluid around incision so that she can return to previous function   CLINICAL IMPRESSION: Pt did well with all exercises today and did not need cueing for relaxation. She reports feeling looser already but still having trouble reaching into the 2nd shelf.    Pt will benefit from skilled therapeutic intervention to improve on the following deficits: Decreased knowledge of precautions, impaired UE functional use, pain, decreased ROM, postural dysfunction.   PT treatment/interventions: ADL/self-care home management, pt/family education, therapeutic exercise  REHAB POTENTIAL: Excellent  CLINICAL DECISION MAKING: Stable/uncomplicated  EVALUATION COMPLEXITY: Low   GOALS: Goals reviewed with patient? YES  LONG TERM GOALS: (STG=LTG)    Name Target Date Goal status  1 Pt will be able to verbalize understanding of pertinent lymphedema risk reduction practices relevant to her dx specifically related to skin care.  Baseline:  No knowledge 11/12/2022 Achieved at eval  2 Pt will be able to  return demo and/or verbalize understanding of the post  op HEP related to regaining shoulder ROM. Baseline:  No knowledge 11/12/2022 Achieved at eval  3 Pt will be able to verbalize understanding of the importance of attending the post op After Breast CA Class for further lymphedema risk reduction education and therapeutic exercise.  Baseline:  No knowledge 11/12/2022 Achieved at eval  4 Pt will demo she has regained full shoulder ROM and function post operatively compared to baselines.  Baseline: See objective measurements taken today. 12/26/2023 Ongoing   5  Pt will be improve functional strength of her right arm so that she able to put dishes on the second shelf of her cabinet without pain  12/26/2022 New    PLAN:  PT FREQUENCY/DURATION: 2 times a week for 4 weeks   PLAN FOR NEXT SESSION: Cont Therapeutic exercise for ROM and strength of right shoulder, manual work to decrease the fullness at right axilla, make sure pt is signed up for ABC class, NO SOZO due to brain stimulator    Idamae Lusher, PT 11/12/2022, 11:48 AM  [facility] [address] [place], [place], [address] Phone: [phone]   Fax:  [phone]

## 2022-11-16 ENCOUNTER — Encounter: Payer: Self-pay | Admitting: Rehabilitation

## 2022-11-16 ENCOUNTER — Ambulatory Visit: Payer: Medicare PPO | Admitting: Rehabilitation

## 2022-11-16 DIAGNOSIS — Z170 Estrogen receptor positive status [ER+]: Secondary | ICD-10-CM | POA: Diagnosis not present

## 2022-11-16 DIAGNOSIS — R293 Abnormal posture: Secondary | ICD-10-CM | POA: Diagnosis not present

## 2022-11-16 DIAGNOSIS — Z483 Aftercare following surgery for neoplasm: Secondary | ICD-10-CM

## 2022-11-16 DIAGNOSIS — M25611 Stiffness of right shoulder, not elsewhere classified: Secondary | ICD-10-CM | POA: Diagnosis not present

## 2022-11-16 DIAGNOSIS — C50211 Malignant neoplasm of upper-inner quadrant of right female breast: Secondary | ICD-10-CM | POA: Diagnosis not present

## 2022-11-16 NOTE — Therapy (Signed)
OUTPATIENT PHYSICAL THERAPY BREAST CANCER TREATMENT   Patient Name: Jodi Medina MRN: [id number] DOB:01-19-1941, 82 y.o., female Today's Date: 11/16/2022  END OF SESSION:  PT End of Session - 11/16/22 0959     Visit Number 5    Number of Visits 10    Date for PT Re-Evaluation 01/06/23    PT Start Time 1000    PT Stop Time 1047    PT Time Calculation (min) 47 min    Activity Tolerance Patient tolerated treatment well    Behavior During Therapy WFL for tasks assessed/performed               Past Medical History:  Diagnosis Date   Arthritis    Breast cancer (HCC)    Diverticulitis    High cholesterol    High triglycerides    Lactose intolerance    Skin cancer    Torn meniscus    bilateral   Tremor    Past Surgical History:  Procedure Laterality Date   BREAST BIOPSY Right 09/22/2022   [place] RT BREAST BX W LOC DEV 1ST LESION IMG BX SPEC US GUIDE 09/22/2022 [facility]   BREAST BIOPSY Right 09/22/2022   [place] RT BREAST BX W LOC DEV EA ADD LESION IMG BX SPEC US GUIDE 09/22/2022 [facility]   BREAST EXCISIONAL BIOPSY Left 1990   CATARACT EXTRACTION Right 05/2022   CATARACT EXTRACTION Left 08/2022   COLONOSCOPY  2019   DEEP BRAIN STIMULATOR PLACEMENT  01/2021   DEEP BRAIN STIMULATOR PLACEMENT  02/2021   DENTAL SURGERY  2017   DILATION AND CURETTAGE OF UTERUS  1990   INJECTION KNEE Right 2020   "gel"   MASTECTOMY W/ SENTINEL NODE BIOPSY Right 10/13/2022   Procedure: RIGHT MASTECTOMY WITH AXILLARY SENTINEL LYMPH NODE BIOPSY;  Surgeon: [name], MD;  Location: [facility];  Service: General;  Laterality: Right;   microscopic knee surgery Left 12/2017   for torn meniscus   MULTIPLE TOOTH EXTRACTIONS  07/2022   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 [name]  REFERRING DIAG: Right breast cancer  THERAPY DIAG:  Malignant neoplasm of upper-inner quadrant of right breast in female, estrogen receptor positive (HCC)  Abnormal posture  Stiffness of right shoulder, not elsewhere classified  Aftercare following surgery for neoplasm  Rationale for Evaluation and Treatment: Rehabilitation  ONSET DATE: 09/18/2022  SUBJECTIVE:  SUBJECTIVE STATEMENT: I still get on my tip toes into the shelf but this weekend it didn't bother me at all.   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 It is ER positive in 1 mass and ER negative in another mass. Both areas are PR positive and HER2 negative with a Ki67 of 5-20%. She has Parkinson's and a deep brain stimulator for reducing tremors. She also has a hx of cerebellar CVA. On 5/ 21/2014 she had right breast mastectomy with removal of  2 lymph nodes negative for metastatic carcinoma. She will not be proceeding with adjuvant radiaiton   PATIENT GOALS:   reduce lymphedema risk and learn post op HEP.   PAIN:  Are you having pain?  no  PRECAUTIONS: at risk for lymphedema   HAND DOMINANCE: right  WEIGHT BEARING RESTRICTIONS: No  FALLS:  Has patient fallen in last 6 months? No  LIVING ENVIRONMENT: Patient lives with: her husband Lives in: House/apartment Has following equipment at home: None  OCCUPATION: Retired  LEISURE: She walks twice a day to take her dog for walks  PRIOR LEVEL OF FUNCTION: Independent   OBJECTIVE: OBSERVATION :  pt with well approximated healing incsion across right chest.  She has an area of fullness at lateral aspect with incision deep inside a skin fold.  There is no drainage or odor. Pt instructed to adjust compression bra so that is covers and does not "dig in" to this area and limit the decongestion of the skin fold.       COGNITION: Overall cognitive status: Within functional limits for tasks assessed    POSTURE:  Forward head and rounded shoulders posture  UPPER EXTREMITY AROM/PROM:  A/PROM RIGHT   eval  Right  11/06/2022  Shoulder extension 46   Shoulder flexion 135 135  Shoulder abduction 165 90  Shoulder internal rotation 71 60  Shoulder external rotation 79 90    (Blank rows = not tested)  A/PROM LEFT   eval  Shoulder extension 36  Shoulder flexion 143  Shoulder abduction 178  Shoulder internal rotation 75  Shoulder external rotation 85    (Blank rows = not tested)  CERVICAL AROM: All within normal limits  UPPER EXTREMITY STRENGTH: WFL   LYMPHEDEMA ASSESSMENTS:   LANDMARK RIGHT   eval Right 11/06/2022  10 cm proximal to olecranon process 27 27  Olecranon process 24.6 24.5  10 cm proximal to ulnar styloid process 22.7 23  Just proximal to ulnar styloid process 16.2 16.3  Across hand at thumb web space 17.3 17.5  At base of 2nd digit 5.9 5.9  (Blank rows = not tested)  LANDMARK LEFT   eval  10 cm proximal to olecranon process 26.5  Olecranon process 24.9  10 cm proximal to ulnar styloid process 22.5  Just proximal to ulnar styloid  process 15.8  Across hand at thumb web space 16.9  At base of 2nd digit 5.9  (Blank rows = not tested)  QUICK DASH SURVEY: 11/11/22: 36.36   TODAY'S TREATMENT:    11/16/22: Therapeutic Exercises Pulleys into flexion x 2 mins and then scaption x 2 min with pt returning correct demo and did well with decreasing  scapular compensations after cuing Roll yellow ball up wall into flexion x 5 and then  Rt UE abd x 5 returning therapist demo for each Yellow band row x 10 with initial instruction Supine dowel flexion x 5 Supine chest stretch 5" x 5 Long arm shoulder alternating flexion x 5  Manual therapy P/ROM of Rt shoulder into flexion, abd and D2  to pts tolerance,  MFR to Rt chest wall and cording in medial upper arm   11/12/22: Therapeutic Exercises Pulleys into flexion x 4 mins and then scaption x 1 min with pt returning correct demo and did well with decreasing scapular compensations after cuing Roll yellow ball up wall into flexion x 10 and then  Rt UE abd x 5 returning therapist demo for each Supine dowel flexion x 5 with initial instruction Supine chest stretch 5" x 5 Supine long arm circles x 10 c/cc Long arm shoulder alternating flexion x 5  Manual therapy P/ROM of Rt shoulder into flexion, abd and D2  to pts tolerance,  MFR to Rt chest wall and cording in medial upper arm   11/11/22: Therapeutic Exercises Pulleys into flexion x 4 mins and then scaption x 1 min with pt returning correct demo and did well with decreasing scapular compensations after cuing Roll yellow ball up wall into flexion x 15 and then  Rt UE abd x 5 returning therapist demo for each Manual therapy P/ROM of Rt shoulder into flexion, abd and D2  to pts tolerance, multiple VC's initially to relax but this improved towards end of session and pt had improved motion MFR to Rt chest wall and cording in medial upper arm (very tender initially but this improved during session) during P/ROM  11/06/22: Reassessment  post op and discussion with pt that she would benefit from PT to regain ROM and strength as she wants it to recover as it is her "good arm" that doesn't have a tremor.  She is healing well, but does have limited ROM especially in abduction. She has a congested skin fold at axilla and compression bra was adjusted to cover that with compression.  Performed gentle active and active assisted exercise today in supine for  right shoulder external and internal rotation, elevation. Hand to ceiling form small controlled circles in each direction and scapular protraction and retraction.  Then to sideling for deltoid strengthening by lifting long arm  up about 30 degrees.  Pt did well with all exercise and is agreeable to continue PT   PATIENT EDUCATION:  Education details: Lymphedema risk reduction and post op shoulder/posture HEP Person educated: Patient Education method: Explanation, Demonstration, Handout Education comprehension: Patient verbalized understanding and returned demonstration  HOME EXERCISE PROGRAM: Patient was instructed today in a home exercise program today for post op shoulder range of motion. These included active assist shoulder flexion in sitting, scapular retraction, wall walking with shoulder abduction, and hands behind head external rotation.  She was encouraged to do these twice a day, holding 3 seconds and repeating 5 times when permitted by her physician.   ASSESSMENT:  Pt is healing well from surgery but will benefit from PT to improved ROM and strength , promote decrease of post op fluid around incision so that she can return to previous function   CLINICAL IMPRESSION: Pt is feeling looser already but still having trouble reaching into the 2nd shelf.  She has improved even since last week.   Pt will benefit from skilled therapeutic intervention to improve on the following  deficits: Decreased knowledge of precautions, impaired UE functional use, pain, decreased ROM, postural  dysfunction.   PT treatment/interventions: ADL/self-care home management, pt/family education, therapeutic exercise  REHAB POTENTIAL: Excellent  CLINICAL DECISION MAKING: Stable/uncomplicated  EVALUATION COMPLEXITY: Low   GOALS: Goals reviewed with patient? YES  LONG TERM GOALS: (STG=LTG)    Name Target Date Goal status  1 Pt will be able to verbalize understanding of pertinent lymphedema risk reduction practices relevant to her dx specifically related to skin care.  Baseline:  No knowledge 11/16/2022 Achieved at eval  2 Pt will be able to return demo and/or verbalize understanding of the post op HEP related to regaining shoulder ROM. Baseline:  No knowledge 11/16/2022 Achieved at eval  3 Pt will be able to verbalize understanding of the importance of attending the post op After Breast CA Class for further lymphedema risk reduction education and therapeutic exercise.  Baseline:  No knowledge 11/16/2022 Achieved at eval  4 Pt will demo she has regained full shoulder ROM and function post operatively compared to baselines.  Baseline: See objective measurements taken today. 12/26/2023 Ongoing   5  Pt will be improve functional strength of her right arm so that she able to put dishes on the second shelf of her cabinet without pain  12/26/2022 New    PLAN:  PT FREQUENCY/DURATION: 2 times a week for 4 weeks   PLAN FOR NEXT SESSION: Cont Therapeutic exercise for ROM and strength of right shoulder, manual work to decrease the fullness at right axilla, make sure pt is signed up for ABC class, NO SOZO due to brain stimulator    Idamae Lusher, PT 11/16/2022, 10:47 AM  [facility] [address] [place], [place], [address] Phone: [phone]   Fax:  [phone]

## 2022-11-17 ENCOUNTER — Ambulatory Visit: Payer: Medicare PPO | Admitting: Rehabilitation

## 2022-11-19 ENCOUNTER — Encounter: Payer: Self-pay | Admitting: Rehabilitation

## 2022-11-19 ENCOUNTER — Ambulatory Visit: Payer: Medicare PPO | Admitting: Rehabilitation

## 2022-11-19 DIAGNOSIS — R293 Abnormal posture: Secondary | ICD-10-CM | POA: Diagnosis not present

## 2022-11-19 DIAGNOSIS — C50211 Malignant neoplasm of upper-inner quadrant of right female breast: Secondary | ICD-10-CM | POA: Diagnosis not present

## 2022-11-19 DIAGNOSIS — M25611 Stiffness of right shoulder, not elsewhere classified: Secondary | ICD-10-CM | POA: Diagnosis not present

## 2022-11-19 DIAGNOSIS — Z483 Aftercare following surgery for neoplasm: Secondary | ICD-10-CM | POA: Diagnosis not present

## 2022-11-19 DIAGNOSIS — Z170 Estrogen receptor positive status [ER+]: Secondary | ICD-10-CM | POA: Diagnosis not present

## 2022-11-19 NOTE — Therapy (Signed)
OUTPATIENT PHYSICAL THERAPY BREAST CANCER TREATMENT   Patient Name: Jodi Medina MRN: [id number] DOB:09-25-1940, 82 y.o., female Today's Date: 11/19/2022  END OF SESSION:  PT End of Session - 11/19/22 0958     Visit Number 6    Number of Visits 10    Date for PT Re-Evaluation 01/06/23    PT Start Time 1000    PT Stop Time 1038    PT Time Calculation (min) 38 min    Activity Tolerance Patient tolerated treatment well    Behavior During Therapy WFL for tasks assessed/performed               Past Medical History:  Diagnosis Date   Arthritis    Breast cancer (HCC)    Diverticulitis    High cholesterol    High triglycerides    Lactose intolerance    Skin cancer    Torn meniscus    bilateral   Tremor    Past Surgical History:  Procedure Laterality Date   BREAST BIOPSY Right 09/22/2022   [place] RT BREAST BX W LOC DEV 1ST LESION IMG BX SPEC US GUIDE 09/22/2022 [facility]   BREAST BIOPSY Right 09/22/2022   [place] RT BREAST BX W LOC DEV EA ADD LESION IMG BX SPEC US GUIDE 09/22/2022 [facility]   BREAST EXCISIONAL BIOPSY Left 1990   CATARACT EXTRACTION Right 05/2022   CATARACT EXTRACTION Left 08/2022   COLONOSCOPY  2019   DEEP BRAIN STIMULATOR PLACEMENT  01/2021   DEEP BRAIN STIMULATOR PLACEMENT  02/2021   DENTAL SURGERY  2017   DILATION AND CURETTAGE OF UTERUS  1990   INJECTION KNEE Right 2020   "gel"   MASTECTOMY W/ SENTINEL NODE BIOPSY Right 10/13/2022   Procedure: RIGHT MASTECTOMY WITH AXILLARY SENTINEL LYMPH NODE BIOPSY;  Surgeon: [name], MD;  Location: [facility] OR;  Service: General;  Laterality: Right;   microscopic knee surgery Left 12/2017   for torn meniscus   MULTIPLE TOOTH EXTRACTIONS  07/2022   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 It is ER positive in 1 mass and ER negative in another mass. Both areas are PR positive and HER2 negative with a Ki67 of 5-20%. She has Parkinson's and a deep brain stimulator for reducing tremors. She also has a hx of cerebellar CVA. On 5/ 21/2014 she had right breast mastectomy with removal of  2 lymph nodes negative for metastatic carcinoma. She will not be proceeding with adjuvant radiaiton   PATIENT GOALS:   reduce lymphedema risk and learn post op HEP.   PAIN:  Are you having pain? no  PRECAUTIONS: at risk for lymphedema   HAND DOMINANCE:  right  WEIGHT BEARING RESTRICTIONS: No  FALLS:  Has patient fallen in last 6 months? No  LIVING ENVIRONMENT: Patient lives with: her husband Lives in: House/apartment Has following equipment at home: None  OCCUPATION: Retired  LEISURE: She walks twice a day to take her dog for walks  PRIOR LEVEL OF FUNCTION: Independent   OBJECTIVE: OBSERVATION :  pt with well approximated healing incsion across right chest.  She has an area of fullness at lateral aspect with incision deep inside a skin fold.  There is no drainage or odor. Pt instructed to adjust compression bra so that is covers and does not "dig in" to this area and limit the decongestion of the skin fold.       COGNITION: Overall cognitive status: Within functional limits for tasks assessed    POSTURE:  Forward head and rounded shoulders posture  UPPER EXTREMITY AROM/PROM:  A/PROM RIGHT   eval  Right  11/06/2022 11/19/22  Shoulder extension 46    Shoulder flexion 135 135   Shoulder abduction 165 90 140  Shoulder internal rotation 71 60   Shoulder external rotation 79 90     (Blank rows = not tested)  A/PROM LEFT   eval  Shoulder extension 36  Shoulder flexion 143  Shoulder abduction 178  Shoulder internal rotation 75  Shoulder external rotation 85    (Blank rows = not tested)  CERVICAL AROM: All within normal limits  UPPER EXTREMITY STRENGTH: WFL   LYMPHEDEMA ASSESSMENTS:   LANDMARK RIGHT   eval Right 11/06/2022  10 cm proximal to olecranon process 27 27  Olecranon process 24.6 24.5  10 cm proximal to ulnar styloid process 22.7 23  Just proximal to ulnar styloid process 16.2 16.3  Across hand at thumb web space 17.3 17.5  At base of 2nd digit 5.9 5.9  (Blank rows = not tested)  LANDMARK LEFT   eval  10 cm proximal to olecranon process 26.5  Olecranon process 24.9  10 cm proximal to ulnar styloid process 22.5  Just proximal to ulnar styloid process 15.8  Across hand at thumb web space 16.9   At base of 2nd digit 5.9  (Blank rows = not tested)  QUICK DASH SURVEY: 11/11/22: 36.36   TODAY'S TREATMENT:    11/19/22: Therapeutic Exercises Pulleys into flexion x 2 mins and then scaption x 2 min  Yellow band row x 10  Yellow and ext x 10  Seated Bil ER yellow x 6 Supine  dowel flexion x 5 Supine extension presses 5" x 6  Education on postural corrections to make per pt request: discussed ears in line with shoulders and chin down and in line Updated HEP and gave yellow band  Manual therapy P/ROM of Rt shoulder into flexion, abd and D2  to pts tolerance,  11/16/22: Therapeutic Exercises Pulleys into flexion x 2 mins and then scaption x 2 min with pt returning correct demo and did well with decreasing scapular compensations after cuing Roll yellow ball up wall into flexion x 5 and then  Rt UE abd x 5 returning therapist demo for each Yellow band row x 10 with initial instruction Supine dowel flexion x 5 Supine chest stretch 5" x 5 Long arm shoulder alternating flexion x 5  Manual therapy P/ROM of Rt shoulder into flexion, abd and D2  to pts tolerance,  MFR to Rt chest wall and cording in medial upper arm    11/12/22: Therapeutic Exercises Pulleys into flexion x 4 mins and then scaption x 1 min with pt returning correct demo and did well with decreasing scapular compensations after cuing Roll yellow ball up wall into flexion x 10 and then  Rt UE abd x 5 returning therapist demo for each Supine dowel flexion x 5 with initial instruction Supine chest stretch 5" x 5 Supine long arm circles x 10 c/cc Long arm shoulder alternating flexion x 5  Manual therapy P/ROM of Rt shoulder into flexion, abd and D2  to pts tolerance,  MFR to Rt chest wall and cording in medial upper arm    PATIENT EDUCATION:  Education details: Lymphedema risk reduction and post op shoulder/posture HEP Person educated: Patient Education method: Explanation, Demonstration, Handout Education  comprehension: Patient verbalized understanding and returned demonstration  HOME EXERCISE PROGRAM: Patient was instructed today in a home exercise program today for post op shoulder range of motion. These included active assist shoulder flexion in sitting, scapular retraction, wall walking with shoulder abduction, and hands behind head external rotation.  She was encouraged to do these twice a day, holding 3 seconds and repeating 5 times when permitted by her physician.  Access Code: [email or web address] URL: [email or web address] Date: 11/19/2022 Prepared by: Gwenevere Abbot  Exercises - Seated Bilateral Shoulder External Rotation with Resistance  - 1 x daily - 3-4 x weekly - 1-3 sets - 10 reps - Standing Row with Anchored Resistance  - 1 x daily - 3-4 x weekly - 1-3 sets - 10 reps - Single Arm Shoulder Extension with Anchored Resistance  - 1 x daily - 3-4 x weekly - 1-3 sets - 10 reps Supine extension presses 5" x 6   CLINICAL IMPRESSION: Pt has improved active abduction from 90 to 140 today, but not yet to baseline.  She reports not feeling swollen at all but that her bra bothers the lateral edge - she gets new bras next week.   Pt will benefit from skilled therapeutic intervention to improve on the following deficits: Decreased knowledge of precautions, impaired UE functional use, pain, decreased ROM, postural dysfunction.   PT treatment/interventions: ADL/self-care home management, pt/family education, therapeutic exercise  REHAB POTENTIAL: Excellent  CLINICAL DECISION MAKING: Stable/uncomplicated  EVALUATION COMPLEXITY: Low   GOALS: Goals reviewed with patient? YES  LONG TERM GOALS: (STG=LTG)    Name Target Date Goal status  1 Pt will be able to verbalize understanding of pertinent lymphedema risk reduction practices relevant to her dx specifically related to skin care.  Baseline:  No knowledge 11/19/2022 Achieved  at eval  2 Pt will be able to return demo and/or verbalize  understanding of the post op HEP related to regaining shoulder ROM. Baseline:  No knowledge 11/19/2022 Achieved at eval  3 Pt will be able to verbalize understanding of the importance of attending the post op After Breast CA Class for further lymphedema risk reduction education and therapeutic exercise.  Baseline:  No knowledge 11/19/2022 Achieved at eval  4 Pt will demo she has regained full shoulder ROM and function post operatively compared to baselines.  Baseline: See objective measurements taken today. 12/26/2023 Ongoing   5  Pt will be improve functional strength of her right arm so that she able to put dishes on the second shelf of her cabinet without pain  12/26/2022 New    PLAN:  PT FREQUENCY/DURATION: 2 times a week for 4 weeks   PLAN FOR NEXT SESSION: Cont Therapeutic exercise for ROM and strength of right shoulder, manual work to decrease the fullness at right axilla, make sure pt is signed up for ABC class, NO SOZO due to brain stimulator    Idamae Lusher, PT 11/19/2022, 10:41 AM  [facility] [address] [place], [place], [address] Phone: [phone]   Fax:  [phone]

## 2022-11-24 ENCOUNTER — Ambulatory Visit: Payer: Medicare PPO | Attending: General Surgery

## 2022-11-24 DIAGNOSIS — Z170 Estrogen receptor positive status [ER+]: Secondary | ICD-10-CM | POA: Diagnosis not present

## 2022-11-24 DIAGNOSIS — C50211 Malignant neoplasm of upper-inner quadrant of right female breast: Secondary | ICD-10-CM | POA: Insufficient documentation

## 2022-11-24 DIAGNOSIS — C50911 Malignant neoplasm of unspecified site of right female breast: Secondary | ICD-10-CM | POA: Diagnosis not present

## 2022-11-24 DIAGNOSIS — Z483 Aftercare following surgery for neoplasm: Secondary | ICD-10-CM | POA: Diagnosis not present

## 2022-11-24 DIAGNOSIS — M25611 Stiffness of right shoulder, not elsewhere classified: Secondary | ICD-10-CM | POA: Diagnosis not present

## 2022-11-24 DIAGNOSIS — R293 Abnormal posture: Secondary | ICD-10-CM | POA: Insufficient documentation

## 2022-11-24 NOTE — Therapy (Signed)
OUTPATIENT PHYSICAL THERAPY BREAST CANCER TREATMENT   Patient Name: Jodi Medina MRN: [id number] DOB:Oct 12, 1940, 82 y.o., female Today's Date: 11/24/2022  END OF SESSION:  PT End of Session - 11/24/22 1005     Visit Number 7    Number of Visits 10    Date for PT Re-Evaluation 01/06/23    PT Start Time 1002    PT Stop Time 1058    PT Time Calculation (min) 56 min    Activity Tolerance Patient tolerated treatment well    Behavior During Therapy WFL for tasks assessed/performed               Past Medical History:  Diagnosis Date   Arthritis    Breast cancer (HCC)    Diverticulitis    High cholesterol    High triglycerides    Lactose intolerance    Skin cancer    Torn meniscus    bilateral   Tremor    Past Surgical History:  Procedure Laterality Date   BREAST BIOPSY Right 09/22/2022   [place] RT BREAST BX W LOC DEV 1ST LESION IMG BX SPEC US GUIDE 09/22/2022 [facility]   BREAST BIOPSY Right 09/22/2022   [place] RT BREAST BX W LOC DEV EA ADD LESION IMG BX SPEC US GUIDE 09/22/2022 [facility]   BREAST EXCISIONAL BIOPSY Left 1990   CATARACT EXTRACTION Right 05/2022   CATARACT EXTRACTION Left 08/2022   COLONOSCOPY  2019   DEEP BRAIN STIMULATOR PLACEMENT  01/2021   DEEP BRAIN STIMULATOR PLACEMENT  02/2021   DENTAL SURGERY  2017   DILATION AND CURETTAGE OF UTERUS  1990   INJECTION KNEE Right 2020   "gel"   MASTECTOMY W/ SENTINEL NODE BIOPSY Right 10/13/2022   Procedure: RIGHT MASTECTOMY WITH AXILLARY SENTINEL LYMPH NODE BIOPSY;  Surgeon: [name], MD;  Location: [facility] OR;  Service: General;  Laterality: Right;   microscopic knee surgery Left 12/2017   for torn meniscus   MULTIPLE TOOTH EXTRACTIONS  07/2022   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 [name]  REFERRING DIAG: Right breast cancer  THERAPY DIAG:  Malignant neoplasm of upper-inner quadrant of right breast in female, estrogen receptor positive (HCC)  Abnormal posture  Stiffness of right shoulder, not elsewhere classified  Aftercare following surgery for neoplasm  Rationale for Evaluation and Treatment: Rehabilitation  ONSET DATE: 09/18/2022  SUBJECTIVE:  SUBJECTIVE STATEMENT: I carried a few grocery bags with cans in them in from my car and my Rt chest bothered me. I had 3 different shooting pains at my pect but then it stopped.    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 It is ER positive in 1 mass and ER negative in another mass. Both areas are PR positive and HER2 negative with a Ki67 of 5-20%. She has Parkinson's and a deep brain stimulator for reducing tremors. She also has a hx of cerebellar CVA. On 5/ 21/2014 she had right breast mastectomy with removal of  2 lymph nodes negative for metastatic carcinoma. She will not be proceeding with adjuvant radiaiton   PATIENT GOALS:   reduce  lymphedema risk and learn post op HEP.   PAIN:  Are you having pain? no  PRECAUTIONS: at risk for lymphedema   HAND DOMINANCE: right  WEIGHT BEARING RESTRICTIONS: No  FALLS:  Has patient fallen in last 6 months? No  LIVING ENVIRONMENT: Patient lives with: her husband Lives in: House/apartment Has following equipment at home: None  OCCUPATION: Retired  LEISURE: She walks twice a day to take her dog for walks  PRIOR LEVEL OF FUNCTION: Independent   OBJECTIVE: OBSERVATION :  pt with well approximated healing incsion across right chest.  She has an area of fullness at lateral aspect with incision deep inside a skin fold.  There is no drainage or odor. Pt instructed to adjust compression bra so that is covers and does not "dig in" to this area and limit the decongestion of the skin fold.       COGNITION: Overall cognitive status: Within functional limits for tasks assessed    POSTURE:  Forward head and rounded shoulders posture  UPPER EXTREMITY AROM/PROM:  A/PROM RIGHT   eval  Right  11/06/2022 11/19/22  Shoulder extension 46    Shoulder flexion 135 135   Shoulder abduction 165 90 140  Shoulder internal rotation 71 60   Shoulder external rotation 79 90     (Blank rows = not tested)  A/PROM LEFT   eval  Shoulder extension 36  Shoulder flexion 143  Shoulder abduction 178  Shoulder internal rotation 75  Shoulder external rotation 85    (Blank rows = not tested)  CERVICAL AROM: All within normal limits  UPPER EXTREMITY STRENGTH: WFL   LYMPHEDEMA ASSESSMENTS:   LANDMARK RIGHT   eval Right 11/06/2022  10 cm proximal to olecranon process 27 27  Olecranon process 24.6 24.5  10 cm proximal to ulnar styloid process 22.7 23  Just proximal to ulnar styloid process 16.2 16.3  Across hand at thumb web space 17.3 17.5  At base of 2nd digit 5.9 5.9  (Blank rows = not tested)  LANDMARK LEFT   eval  10 cm proximal to olecranon process 26.5  Olecranon process  24.9  10 cm proximal to ulnar styloid process 22.5  Just proximal to ulnar styloid process 15.8  Across hand at thumb web space 16.9  At base of 2nd digit 5.9  (Blank rows = not tested)  QUICK DASH SURVEY: 11/11/22: 36.36   TODAY'S TREATMENT:    11/24/22: Therapeutic Exercises Pulleys  into flexion x 2 mins and then scaption x 2 min  Yellow band row 2 x 10  Yellow band ext x 10  Seated Bil ER yellow x 10 Doorway pectoralis stretch 4x in varying positions x 20 sec holds Supine chest press with 2# on dowel x 10 Supine flexion with dowel 2 x 5 Manual therapy P/ROM of Rt shoulder into flexion, abd and D2  to pts tolerance with scapular depression by therapist throughout gently as pt was tender along spine of scapula STM to Rt pectoralis tendon where pt reports feeling very tight with end motions MFR to Rt chest wall during end P/ROMs  11/19/22: Therapeutic Exercises Pulleys into flexion x 2 mins and then scaption x 2 min  Yellow band row x 10  Yellow and ext x 10  Seated Bil ER yellow x 6 Supine dowel flexion x 5 Supine extension presses 5" x 6  Education on postural corrections to make per pt request: discussed ears in line with shoulders and chin down and in line Updated HEP and gave yellow band  Manual therapy P/ROM of Rt shoulder into flexion, abd and D2  to pts tolerance,  11/16/22: Therapeutic Exercises Pulleys into flexion x 2 mins and then scaption x 2 min with pt returning correct demo and did well with decreasing scapular compensations after cuing Roll yellow ball up wall into flexion x 5 and then  Rt UE abd x 5 returning therapist demo for each Yellow band row x 10 with initial instruction Supine dowel flexion x 5 Supine chest stretch 5" x 5 Long arm shoulder alternating flexion x 5  Manual therapy P/ROM of Rt shoulder into flexion, abd and D2  to pts tolerance,  MFR to Rt chest wall and cording in medial upper arm      PATIENT EDUCATION:  Education details:  Lymphedema risk reduction and post op shoulder/posture HEP Person educated: Patient Education method: Explanation, Demonstration, Handout Education comprehension: Patient verbalized understanding and returned demonstration  HOME EXERCISE PROGRAM: Patient was instructed today in a home exercise program today for post op shoulder range of motion. These included active assist shoulder flexion in sitting, scapular retraction, wall walking with shoulder abduction, and hands behind head external rotation.  She was encouraged to do these twice a day, holding 3 seconds and repeating 5 times when permitted by her physician.  Access Code: [email or web address] URL: [email or web address] Date: 11/19/2022 Prepared by: Gwenevere Abbot  Exercises - Seated Bilateral Shoulder External Rotation with Resistance  - 1 x daily - 3-4 x weekly - 1-3 sets - 10 reps - Standing Row with Anchored Resistance  - 1 x daily - 3-4 x weekly - 1-3 sets - 10 reps - Single Arm Shoulder Extension with Anchored Resistance  - 1 x daily - 3-4 x weekly - 1-3 sets - 10 reps Supine extension presses 5" x 6   CLINICAL IMPRESSION: Continued with yellow theraband strengthening for Rt UE today advancing some sets. Also added chest press with 2# and continued supine dowel flex which pt reported was challenging at last session but some better today. Then continued with P/ROM of Rt shoulder which is also improving. Added STM and MFR to Rt pectoralis as this is mod palpably tight and limits her end motions. Some good softening of pectoralis noted by end of session with improved end P/ROM as well in all directions.   Pt will benefit from skilled therapeutic intervention to improve on the following deficits: Decreased knowledge of precautions, impaired UE  functional use, pain, decreased ROM, postural dysfunction.   PT treatment/interventions: ADL/self-care home management, pt/family education, therapeutic exercise  REHAB POTENTIAL:  Excellent  CLINICAL DECISION MAKING: Stable/uncomplicated  EVALUATION COMPLEXITY: Low   GOALS: Goals reviewed with patient? YES  LONG TERM GOALS: (STG=LTG)    Name Target Date Goal status  1 Pt will be able to verbalize understanding of pertinent lymphedema risk reduction practices relevant to her dx specifically related to skin care.  Baseline:  No knowledge 11/24/2022 Achieved at eval  2 Pt will be able to return demo and/or verbalize understanding of the post op HEP related to regaining shoulder ROM. Baseline:  No knowledge 11/24/2022 Achieved at eval  3 Pt will be able to verbalize understanding of the importance of attending the post op After Breast CA Class for further lymphedema risk reduction education and therapeutic exercise.  Baseline:  No knowledge 11/24/2022 Achieved at eval  4 Pt will demo she has regained full shoulder ROM and function post operatively compared to baselines.  Baseline: See objective measurements taken today. 12/26/2023 Ongoing   5  Pt will be improve functional strength of her right arm so that she able to put dishes on the second shelf of her cabinet without pain  12/26/2022 New    PLAN:  PT FREQUENCY/DURATION: 2 times a week for 4 weeks   PLAN FOR NEXT SESSION: Cont Therapeutic exercise for ROM and strength of right shoulder, manual work to decrease the fullness at right axilla, make sure pt is signed up for ABC class, NO SOZO due to brain stimulator    Hermenia Bers, PTA 11/24/2022, 11:04 AM  [facility] [address] [place], [place], [address] Phone: [phone]   Fax:  [phone]

## 2022-12-01 ENCOUNTER — Encounter: Payer: Self-pay | Admitting: Rehabilitation

## 2022-12-01 ENCOUNTER — Ambulatory Visit: Payer: Medicare PPO | Admitting: Rehabilitation

## 2022-12-01 DIAGNOSIS — M25611 Stiffness of right shoulder, not elsewhere classified: Secondary | ICD-10-CM | POA: Diagnosis not present

## 2022-12-01 DIAGNOSIS — Z170 Estrogen receptor positive status [ER+]: Secondary | ICD-10-CM

## 2022-12-01 DIAGNOSIS — Z483 Aftercare following surgery for neoplasm: Secondary | ICD-10-CM | POA: Diagnosis not present

## 2022-12-01 DIAGNOSIS — R293 Abnormal posture: Secondary | ICD-10-CM | POA: Diagnosis not present

## 2022-12-01 DIAGNOSIS — C50211 Malignant neoplasm of upper-inner quadrant of right female breast: Secondary | ICD-10-CM | POA: Diagnosis not present

## 2022-12-01 NOTE — Therapy (Signed)
OUTPATIENT PHYSICAL THERAPY BREAST CANCER TREATMENT   Patient Name: Jodi Medina MRN: [id number] DOB:[birth year], 82 y.o., female Today's Date: 12/01/2022  END OF SESSION:  PT End of Session - 12/01/22 1000     Visit Number 8    Number of Visits 10    Date for PT Re-Evaluation 01/06/23    Authorization Type 8 visits 11/06/22-12/11/22    Authorization - Visit Number 7    Authorization - Number of Visits 8    PT Start Time 1003    PT Stop Time 1047    PT Time Calculation (min) 44 min    Activity Tolerance Patient tolerated treatment well    Behavior During Therapy WFL for tasks assessed/performed                Past Medical History:  Diagnosis Date   Arthritis    Breast cancer (HCC)    Diverticulitis    High cholesterol    High triglycerides    Lactose intolerance    Skin cancer    Torn meniscus    bilateral   Tremor    Past Surgical History:  Procedure Laterality Date   BREAST BIOPSY Right 09/22/2022   [place] RT BREAST BX W LOC DEV 1ST LESION IMG BX SPEC US GUIDE 09/22/2022 [facility]   BREAST BIOPSY Right 09/22/2022   [place] RT BREAST BX W LOC DEV EA ADD LESION IMG BX SPEC US GUIDE 09/22/2022 [facility]   BREAST EXCISIONAL BIOPSY Left 1990   CATARACT EXTRACTION Right 05/2022   CATARACT EXTRACTION Left 08/2022   COLONOSCOPY  2019   DEEP BRAIN STIMULATOR PLACEMENT  01/2021   DEEP BRAIN STIMULATOR PLACEMENT  02/2021   DENTAL SURGERY  2017   DILATION AND CURETTAGE OF UTERUS  1990   INJECTION KNEE Right 2020   "gel"   MASTECTOMY W/ SENTINEL NODE BIOPSY Right 10/13/2022   Procedure: RIGHT MASTECTOMY WITH AXILLARY SENTINEL LYMPH NODE BIOPSY;  Surgeon: [name], MD;  Location: [facility] OR;  Service: General;  Laterality: Right;   microscopic knee surgery Left 12/2017   for torn meniscus   MULTIPLE TOOTH EXTRACTIONS  07/2022   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 [name]  REFERRING DIAG: Right breast cancer  THERAPY DIAG:  Malignant neoplasm of upper-inner quadrant of right breast in female, estrogen receptor positive (HCC)  Abnormal posture  Stiffness of right shoulder, not elsewhere classified  Aftercare following surgery for neoplasm  Rationale for Evaluation and Treatment: Rehabilitation  ONSET DATE: 09/18/2022  SUBJECTIVE:  SUBJECTIVE STATEMENT: I sorted through boxes and my arm didn't bother me at all.    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 It is ER positive in 1 mass and ER negative in another mass. Both areas are PR positive and HER2 negative with a Ki67 of 5-20%. She has Parkinson's and a deep brain stimulator for reducing tremors. She also has a hx of cerebellar CVA. On 5/ 21/2014 she had right breast mastectomy with removal of  2 lymph nodes negative for metastatic carcinoma. She will not be proceeding with adjuvant radiaiton   PATIENT  GOALS:   reduce lymphedema risk and learn post op HEP.   PAIN:  Are you having pain? no  PRECAUTIONS: at risk for lymphedema   HAND DOMINANCE: right  WEIGHT BEARING RESTRICTIONS: No  FALLS:  Has patient fallen in last 6 months? No  LIVING ENVIRONMENT: Patient lives with: her husband Lives in: House/apartment Has following equipment at home: None  OCCUPATION: Retired  LEISURE: She walks twice a day to take her dog for walks  PRIOR LEVEL OF FUNCTION: Independent   OBJECTIVE: OBSERVATION :  pt with well approximated healing incsion across right chest.  She has an area of fullness at lateral aspect with incision deep inside a skin fold.  There is no drainage or odor. Pt instructed to adjust compression bra so that is covers and does not "dig in" to this area and limit the decongestion of the skin fold.       COGNITION: Overall cognitive status: Within functional limits for tasks assessed    POSTURE:  Forward head and rounded shoulders posture  UPPER EXTREMITY AROM/PROM:  A/PROM RIGHT   eval  Right  11/06/2022 11/19/22 12/01/22  Shoulder extension 46   40  Shoulder flexion 135 135  140  Shoulder abduction 165 90 140 140 - tight back of the shoulder   Shoulder internal rotation 71 60    Shoulder external rotation 79 90      (Blank rows = not tested)  A/PROM LEFT   eval  Shoulder extension 36  Shoulder flexion 143  Shoulder abduction 178  Shoulder internal rotation 75  Shoulder external rotation 85    (Blank rows = not tested)  LYMPHEDEMA ASSESSMENTS:   LANDMARK RIGHT   eval Right 11/06/2022  10 cm proximal to olecranon process 27 27  Olecranon process 24.6 24.5  10 cm proximal to ulnar styloid process 22.7 23  Just proximal to ulnar styloid process 16.2 16.3  Across hand at thumb web space 17.3 17.5  At base of 2nd digit 5.9 5.9  (Blank rows = not tested)  LANDMARK LEFT   eval  10 cm proximal to olecranon process 26.5  Olecranon process 24.9  10 cm  proximal to ulnar styloid process 22.5  Just proximal to ulnar styloid process 15.8  Across hand at thumb web space 16.9  At base of 2nd digit 5.9  (Blank rows = not tested)  QUICK DASH SURVEY: 11/11/22: 36.36   TODAY'S TREATMENT:    12/01/22: Therapeutic Exercises Pulleys into flexion x 2 mins and then scaption x 2 min  Yellow band row 2 x 10  Yellow band  ext x 10  Seated Bil ER yellow x 10 Supine chest press with 2# on dowel x 10 Supine flexion with dowel 2 x 5 Sidelying abduction x 6 Manual therapy P/ROM of Rt shoulder into flexion, abd and D2  to pts tolerance  STM to Rt pectoralis tendon where pt reports feeling very tight with end motions MFR to Rt chest wall during end P/ROMs  11/24/22: Therapeutic Exercises Pulleys into flexion x 2 mins and then scaption x 2 min  Yellow band row 2 x 10  Yellow band ext x 10  Seated Bil ER yellow x 10 Doorway pectoralis stretch 4x in varying positions x 20 sec holds Supine chest press with 2# on dowel x 10 Supine flexion with dowel 2 x 5 Manual therapy P/ROM of Rt shoulder into flexion, abd and D2  to pts tolerance with scapular depression by therapist throughout gently as pt was tender along spine of scapula STM to Rt pectoralis tendon where pt reports feeling very tight with end motions MFR to Rt chest wall during end P/ROMs  11/19/22: Therapeutic Exercises Pulleys into flexion x 2 mins and then scaption x 2 min  Yellow band row x 10  Yellow and ext x 10  Seated Bil ER yellow x 6 Supine dowel flexion x 5 Supine extension presses 5" x 6  Education on postural corrections to make per pt request: discussed ears in line with shoulders and chin down and in line Updated HEP and gave yellow band  Manual therapy P/ROM of Rt shoulder into flexion, abd and D2  to pts tolerance,  PATIENT EDUCATION:  Education details: Lymphedema risk reduction and post op shoulder/posture HEP Person educated: Patient Education method: Explanation,  Demonstration, Handout Education comprehension: Patient verbalized understanding and returned demonstration  HOME EXERCISE PROGRAM: Patient was instructed today in a home exercise program today for post op shoulder range of motion. These included active assist shoulder flexion in sitting, scapular retraction, wall walking with shoulder abduction, and hands behind head external rotation.  She was encouraged to do these twice a day, holding 3 seconds and repeating 5 times when permitted by her physician.  Access Code: [email or web address] URL: [email or web address] Date: 11/19/2022 Prepared by: Gwenevere Abbot  Exercises - Seated Bilateral Shoulder External Rotation with Resistance  - 1 x daily - 3-4 x weekly - 1-3 sets - 10 reps - Standing Row with Anchored Resistance  - 1 x daily - 3-4 x weekly - 1-3 sets - 10 reps - Single Arm Shoulder Extension with Anchored Resistance  - 1 x daily - 3-4 x weekly - 1-3 sets - 10 reps Supine extension presses 5" x 6   CLINICAL IMPRESSION: Pt will be on visit 8/8 next time and will think about doing more vs being discharged.  She could be fine either way.  Her abduction is still not back to baseline but all other goals have been met.  She could also use a review of HEP.   Pt will benefit from skilled therapeutic intervention to improve on the following deficits: Decreased knowledge of precautions, impaired UE functional use, pain, decreased ROM, postural dysfunction.   PT treatment/interventions: ADL/self-care home management, pt/family education, therapeutic exercise  REHAB POTENTIAL: Excellent  CLINICAL DECISION MAKING: Stable/uncomplicated  EVALUATION COMPLEXITY: Low   GOALS: Goals reviewed with patient? YES  LONG TERM GOALS: (STG=LTG)    Name Target Date Goal status  1 Pt will be able to verbalize understanding of pertinent lymphedema risk reduction practices relevant to her dx  specifically related to skin care.  Baseline:  No knowledge  12/01/2022 Achieved at eval  2 Pt will be able to return demo and/or verbalize understanding of the post op HEP related to regaining shoulder ROM. Baseline:  No knowledge 12/01/2022 Achieved at eval  3 Pt will be able to verbalize understanding of the importance of attending the post op After Breast CA Class for further lymphedema risk reduction education and therapeutic exercise.  Baseline:  No knowledge 12/01/2022 Achieved at eval  4 Pt will demo she has regained full shoulder ROM and function post operatively compared to baselines.  Baseline: See objective measurements taken today. 12/26/2023 Ongoing   5  Pt will be improve functional strength of her right arm so that she able to put dishes on the second shelf of her cabinet without pain  12/26/2022 New    PLAN:  PT FREQUENCY/DURATION: 2 times a week for 4 weeks   PLAN FOR NEXT SESSION: more visits or last? Check goals. Cont Therapeutic exercise for ROM and strength of right shoulder, manual work to decrease the fullness at right axilla, make sure pt is signed up for ABC class, NO SOZO due to brain stimulator    Idamae Lusher, PT 12/01/2022, 10:48 AM  [facility] [address] [place], [place], [address] Phone: [phone]   Fax:  [phone]

## 2022-12-03 ENCOUNTER — Ambulatory Visit: Payer: Medicare PPO | Admitting: Rehabilitation

## 2022-12-03 ENCOUNTER — Encounter: Payer: Self-pay | Admitting: Rehabilitation

## 2022-12-03 DIAGNOSIS — M25611 Stiffness of right shoulder, not elsewhere classified: Secondary | ICD-10-CM

## 2022-12-03 DIAGNOSIS — Z170 Estrogen receptor positive status [ER+]: Secondary | ICD-10-CM

## 2022-12-03 DIAGNOSIS — Z483 Aftercare following surgery for neoplasm: Secondary | ICD-10-CM

## 2022-12-03 DIAGNOSIS — C50211 Malignant neoplasm of upper-inner quadrant of right female breast: Secondary | ICD-10-CM | POA: Diagnosis not present

## 2022-12-03 DIAGNOSIS — R293 Abnormal posture: Secondary | ICD-10-CM | POA: Diagnosis not present

## 2022-12-03 NOTE — Therapy (Signed)
OUTPATIENT PHYSICAL THERAPY BREAST CANCER TREATMENT   Patient Name: Jodi Medina MRN: [id number] DOB:1940/11/07, 82 y.o., female Today's Date: 12/03/2022  END OF SESSION:  PT End of Session - 12/03/22 1155     Number of Visits 16    Date for PT Re-Evaluation 01/07/23    PT Start Time 1000    PT Stop Time 1050    PT Time Calculation (min) 50 min    Activity Tolerance Patient tolerated treatment well    Behavior During Therapy WFL for tasks assessed/performed                Past Medical History:  Diagnosis Date   Arthritis    Breast cancer (HCC)    Diverticulitis    High cholesterol    High triglycerides    Lactose intolerance    Skin cancer    Torn meniscus    bilateral   Tremor    Past Surgical History:  Procedure Laterality Date   BREAST BIOPSY Right 09/22/2022   [place] RT BREAST BX W LOC DEV 1ST LESION IMG BX SPEC US GUIDE 09/22/2022 [facility]   BREAST BIOPSY Right 09/22/2022   [place] RT BREAST BX W LOC DEV EA ADD LESION IMG BX SPEC US GUIDE 09/22/2022 [facility]   BREAST EXCISIONAL BIOPSY Left 1990   CATARACT EXTRACTION Right 05/2022   CATARACT EXTRACTION Left 08/2022   COLONOSCOPY  2019   DEEP BRAIN STIMULATOR PLACEMENT  01/2021   DEEP BRAIN STIMULATOR PLACEMENT  02/2021   DENTAL SURGERY  2017   DILATION AND CURETTAGE OF UTERUS  1990   INJECTION KNEE Right 2020   "gel"   MASTECTOMY W/ SENTINEL NODE BIOPSY Right 10/13/2022   Procedure: RIGHT MASTECTOMY WITH AXILLARY SENTINEL LYMPH NODE BIOPSY;  Surgeon: [name], MD;  Location: [facility] OR;  Service: General;  Laterality: Right;   microscopic knee surgery Left 12/2017   for torn meniscus   MULTIPLE TOOTH EXTRACTIONS  07/2022   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 [name]  REFERRING DIAG: Right breast cancer  THERAPY DIAG:  Malignant neoplasm of upper-inner quadrant of right breast in female, estrogen receptor positive (HCC)  Abnormal posture  Stiffness of right shoulder, not elsewhere classified  Aftercare following surgery for neoplasm  Rationale for Evaluation and Treatment: Rehabilitation  ONSET DATE: 09/18/2022  SUBJECTIVE:  SUBJECTIVE STATEMENT:  I want to continue on with therapy.  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 It is ER positive in 1 mass and ER negative in another mass. Both areas are PR positive and HER2 negative with a Ki67 of 5-20%. She has Parkinson's and a deep brain stimulator for reducing tremors. She also has a hx of cerebellar CVA. On 5/ 21/2014 she had right breast mastectomy with removal of  2 lymph nodes negative for metastatic carcinoma. She will not be proceeding with adjuvant radiaiton   PATIENT GOALS:   reduce lymphedema risk and learn post op HEP.   PAIN:  Are you having pain? no  PRECAUTIONS: at risk for  lymphedema   HAND DOMINANCE: right  WEIGHT BEARING RESTRICTIONS: No  FALLS:  Has patient fallen in last 6 months? No  LIVING ENVIRONMENT: Patient lives with: her husband Lives in: House/apartment Has following equipment at home: None  OCCUPATION: Retired  LEISURE: She walks twice a day to take her dog for walks  PRIOR LEVEL OF FUNCTION: Independent   OBJECTIVE: OBSERVATION :  pt with well approximated healing incsion across right chest.  She has an area of fullness at lateral aspect with incision deep inside a skin fold.  There is no drainage or odor. Pt instructed to adjust compression bra so that is covers and does not "dig in" to this area and limit the decongestion of the skin fold.       COGNITION: Overall cognitive status: Within functional limits for tasks assessed    POSTURE:  Forward head and rounded shoulders posture  UPPER EXTREMITY AROM/PROM:  A/PROM RIGHT   eval  Right  11/06/2022 11/19/22 12/01/22  Shoulder extension 46   40  Shoulder flexion 135 135  140  Shoulder abduction 165 90 140 140 - tight back of the shoulder   Shoulder internal rotation 71 60    Shoulder external rotation 79 90      (Blank rows = not tested)  A/PROM LEFT   eval  Shoulder extension 36  Shoulder flexion 143  Shoulder abduction 178  Shoulder internal rotation 75  Shoulder external rotation 85    (Blank rows = not tested)  LYMPHEDEMA ASSESSMENTS:   LANDMARK RIGHT   eval Right 11/06/2022  10 cm proximal to olecranon process 27 27  Olecranon process 24.6 24.5  10 cm proximal to ulnar styloid process 22.7 23  Just proximal to ulnar styloid process 16.2 16.3  Across hand at thumb web space 17.3 17.5  At base of 2nd digit 5.9 5.9  (Blank rows = not tested)  LANDMARK LEFT   eval  10 cm proximal to olecranon process 26.5  Olecranon process 24.9  10 cm proximal to ulnar styloid process 22.5  Just proximal to ulnar styloid process 15.8  Across hand at thumb web space  16.9  At base of 2nd digit 5.9  (Blank rows = not tested)  QUICK DASH SURVEY: 11/11/22: 36.36  12/03/22: 25%  TODAY'S TREATMENT:    12/03/22: Therapeutic Exercises Pulleys into flexion x 2 mins and then scaption x 2 min  Yellow band row 2  x 10  Back against wall Bil ER yellow x 10 Back against the wall: snow angel x 6, alternating flexion x 6 Supine chest press with 2# x 10 Sidelying abduction x 6 Manual therapy P/ROM of Rt shoulder into flexion, abd and D2  to pts tolerance  STM to Rt pectoralis and lat  12/01/22: Therapeutic Exercises Pulleys into flexion x 2 mins and then scaption x 2 min  Yellow band row 2 x 10  Yellow band ext x 10  Seated Bil ER yellow x 10 Supine chest press with 2# on dowel x 10 Supine flexion with dowel 2 x 5 Sidelying abduction x 6 Manual therapy P/ROM of Rt shoulder into flexion, abd and D2  to pts tolerance  STM to Rt pectoralis tendon where pt reports feeling very tight with end motions MFR to Rt chest wall during end P/ROMs  11/24/22: Therapeutic Exercises Pulleys into flexion x 2 mins and then scaption x 2 min  Yellow band row 2 x 10  Yellow band ext x 10  Seated Bil ER yellow x 10 Doorway pectoralis stretch 4x in varying positions x 20 sec holds Supine chest press with 2# on dowel x 10 Supine flexion with dowel 2 x 5 Manual therapy P/ROM of Rt shoulder into flexion, abd and D2  to pts tolerance with scapular depression by therapist throughout gently as pt was tender along spine of scapula STM to Rt pectoralis tendon where pt reports feeling very tight with end motions MFR to Rt chest wall during end P/ROMs  11/19/22: Therapeutic Exercises Pulleys into flexion x 2 mins and then scaption x 2 min  Yellow band row x 10  Yellow and ext x 10  Seated Bil ER yellow x 6 Supine dowel flexion x 5 Supine extension presses 5" x 6  Education on postural corrections to make per pt request: discussed ears in line with shoulders and chin down and in  line Updated HEP and gave yellow band  Manual therapy P/ROM of Rt shoulder into flexion, abd and D2  to pts tolerance,  Quick Dash - 12/03/22 0001     Open a tight or new jar Mild difficulty    Do heavy household chores (wash walls, wash floors) Moderate difficulty    Carry a shopping bag or briefcase No difficulty    Wash your back Moderate difficulty    Use a knife to cut food Moderate difficulty    Recreational activities in which you take some force or impact through your arm, shoulder, or hand (golf, hammering, tennis) Severe difficulty    During the past week, to what extent has your arm, shoulder or hand problem interfered with your normal social activities with family, friends, neighbors, or groups? Not at all    During the past week, to what extent has your arm, shoulder or hand problem limited your work or other regular daily activities Not at all    Arm, shoulder, or hand pain. None    Tingling (pins and needles) in your arm, shoulder, or hand None    Difficulty Sleeping Mild difficulty    DASH Score 25 %            PATIENT EDUCATION:  Education details: Lymphedema risk reduction and post op shoulder/posture HEP Person educated: Patient Education method: Explanation, Demonstration, Handout Education comprehension: Patient verbalized understanding and returned demonstration  HOME EXERCISE PROGRAM: Patient was instructed today in a home exercise program today for post op shoulder range of motion. These  included active assist shoulder flexion in sitting, scapular retraction, wall walking with shoulder abduction, and hands behind head external rotation.  She was encouraged to do these twice a day, holding 3 seconds and repeating 5 times when permitted by her physician.  Access Code: [email or web address] URL: [email or web address] Date: 11/19/2022 Prepared by: Gwenevere Abbot  Exercises - Seated Bilateral Shoulder External Rotation with Resistance  - 1 x daily - 3-4 x weekly  - 1-3 sets - 10 reps - Standing Row with Anchored Resistance  - 1 x daily - 3-4 x weekly - 1-3 sets - 10 reps - Single Arm Shoulder Extension with Anchored Resistance  - 1 x daily - 3-4 x weekly - 1-3 sets - 10 reps Supine extension presses 5" x 6   CLINICAL IMPRESSION: Pt is still limited in abduction AROM but is making progress.  She has decreased her QDASH to 25% and feels like this would be as good as it would probably get due to other issues.  She can now put her dishes away in the cabinet.  Her biggest complaint is still lack of end range of motion and wanting to get stronger in a more supervised way vs HEP.   Pt will benefit from skilled therapeutic intervention to improve on the following deficits: Decreased knowledge of precautions, impaired UE functional use, pain, decreased ROM, postural dysfunction.   PT treatment/interventions: ADL/self-care home management, pt/family education, therapeutic exercise  REHAB POTENTIAL: Excellent  CLINICAL DECISION MAKING: Stable/uncomplicated  EVALUATION COMPLEXITY: Low   GOALS: Goals reviewed with patient? YES  LONG TERM GOALS: (STG=LTG)    Name Target Date Goal status  1 Pt will be able to verbalize understanding of pertinent lymphedema risk reduction practices relevant to her dx specifically related to skin care.  Baseline:  No knowledge 12/03/2022 Achieved at eval  2 Pt will be able to return demo and/or verbalize understanding of the post op HEP related to regaining shoulder ROM. Baseline:  No knowledge 12/03/2022 Achieved at eval  3 Pt will be able to verbalize understanding of the importance of attending the post op After Breast CA Class for further lymphedema risk reduction education and therapeutic exercise.  Baseline:  No knowledge 12/03/2022 Achieved at eval  4 Pt will demo she has regained full shoulder ROM and function post operatively compared to baselines.  Baseline: See objective measurements taken today. 12/26/2023 Ongoing   5   Pt will be improve functional strength of her right arm so that she able to put dishes on the second shelf of her cabinet without pain  12/26/2022 MET    PLAN:  PT FREQUENCY/DURATION: 2 times a week for 4 weeks   PLAN FOR NEXT SESSION: Cont Therapeutic exercise for ROM and strength of right shoulder, manual work PRN,  NO SOZO due to brain stimulator    Idamae Lusher, PT 12/03/2022, 11:56 AM  [facility] [address] [place], [place], [address] Phone: [phone]   Fax:  [phone]

## 2022-12-07 ENCOUNTER — Encounter: Payer: Self-pay | Admitting: Rehabilitation

## 2022-12-07 ENCOUNTER — Ambulatory Visit: Payer: Medicare PPO | Admitting: Rehabilitation

## 2022-12-07 DIAGNOSIS — M25611 Stiffness of right shoulder, not elsewhere classified: Secondary | ICD-10-CM

## 2022-12-07 DIAGNOSIS — Z170 Estrogen receptor positive status [ER+]: Secondary | ICD-10-CM | POA: Diagnosis not present

## 2022-12-07 DIAGNOSIS — Z483 Aftercare following surgery for neoplasm: Secondary | ICD-10-CM

## 2022-12-07 DIAGNOSIS — R293 Abnormal posture: Secondary | ICD-10-CM

## 2022-12-07 DIAGNOSIS — C50211 Malignant neoplasm of upper-inner quadrant of right female breast: Secondary | ICD-10-CM | POA: Diagnosis not present

## 2022-12-07 NOTE — Therapy (Signed)
OUTPATIENT PHYSICAL THERAPY BREAST CANCER TREATMENT   Patient Name: Jodi Medina MRN: [id number] DOB:Nov 30, 1940, 82 y.o., female Today's Date: 12/07/2022  END OF SESSION:  PT End of Session - 12/07/22 1003     Visit Number 10    Number of Visits 16    Date for PT Re-Evaluation 01/07/23    Authorization Type 8 visits 12/07/22-01/07/23    Authorization - Visit Number 1    Authorization - Number of Visits 8    PT Start Time 1002    PT Stop Time 1047    PT Time Calculation (min) 45 min    Activity Tolerance Patient tolerated treatment well    Behavior During Therapy WFL for tasks assessed/performed                Past Medical History:  Diagnosis Date   Arthritis    Breast cancer (HCC)    Diverticulitis    High cholesterol    High triglycerides    Lactose intolerance    Skin cancer    Torn meniscus    bilateral   Tremor    Past Surgical History:  Procedure Laterality Date   BREAST BIOPSY Right 09/22/2022   [place] RT BREAST BX W LOC DEV 1ST LESION IMG BX SPEC US GUIDE 09/22/2022 [facility]   BREAST BIOPSY Right 09/22/2022   [place] RT BREAST BX W LOC DEV EA ADD LESION IMG BX SPEC US GUIDE 09/22/2022 [facility]   BREAST EXCISIONAL BIOPSY Left 1990   CATARACT EXTRACTION Right 05/2022   CATARACT EXTRACTION Left 08/2022   COLONOSCOPY  2019   DEEP BRAIN STIMULATOR PLACEMENT  01/2021   DEEP BRAIN STIMULATOR PLACEMENT  02/2021   DENTAL SURGERY  2017   DILATION AND CURETTAGE OF UTERUS  1990   INJECTION KNEE Right 2020   "gel"   MASTECTOMY W/ SENTINEL NODE BIOPSY Right 10/13/2022   Procedure: RIGHT MASTECTOMY WITH AXILLARY SENTINEL LYMPH NODE BIOPSY;  Surgeon: [name], MD;  Location: [facility] OR;  Service: General;  Laterality: Right;   microscopic knee surgery Left 12/2017   for torn meniscus   MULTIPLE TOOTH EXTRACTIONS  07/2022   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 [name]  REFERRING DIAG: Right breast cancer  THERAPY DIAG:  Malignant neoplasm of upper-inner quadrant of right breast in female, estrogen receptor positive (HCC)  Stiffness of right shoulder, not elsewhere classified  Abnormal posture  Aftercare following surgery for neoplasm  Rationale for Evaluation and Treatment: Rehabilitation  ONSET DATE: 09/18/2022  SUBJECTIVE:  SUBJECTIVE STATEMENT: I feel like maybe my incision is sort of red.  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 It is ER positive in 1 mass and ER negative in another mass. Both areas are PR positive and HER2 negative with a Ki67 of 5-20%. She has Parkinson's and a deep brain stimulator for reducing tremors. She also has a hx of cerebellar CVA. On 5/ 21/2014 she had right breast mastectomy with removal of  2 lymph nodes negative for metastatic carcinoma. She will not be proceeding with adjuvant  radiaiton   PATIENT GOALS:   reduce lymphedema risk and learn post op HEP.   PAIN:  Are you having pain? no  PRECAUTIONS: at risk for lymphedema   HAND DOMINANCE: right  WEIGHT BEARING RESTRICTIONS: No  FALLS:  Has patient fallen in last 6 months? No  LIVING ENVIRONMENT: Patient lives with: her husband Lives in: House/apartment Has following equipment at home: None  OCCUPATION: Retired  LEISURE: She walks twice a day to take her dog for walks  PRIOR LEVEL OF FUNCTION: Independent   OBJECTIVE: OBSERVATION :  pt with well approximated healing incsion across right chest.  She has an area of fullness at lateral aspect with incision deep inside a skin fold.  There is no drainage or odor. Pt instructed to adjust compression bra so that is covers and does not "dig in" to this area and limit the decongestion of the skin fold.       COGNITION: Overall cognitive status: Within functional limits for tasks assessed    POSTURE:  Forward head and rounded shoulders posture  UPPER EXTREMITY AROM/PROM:  A/PROM RIGHT   eval  Right  11/06/2022 11/19/22 12/01/22 12/07/22  Shoulder extension 46   40   Shoulder flexion 135 135  140 140  Shoulder abduction 165 90 140 140 - tight back of the shoulder  160  Shoulder internal rotation 71 60     Shoulder external rotation 79 90       (Blank rows = not tested)  A/PROM LEFT   eval  Shoulder extension 36  Shoulder flexion 143  Shoulder abduction 178  Shoulder internal rotation 75  Shoulder external rotation 85    (Blank rows = not tested)  LYMPHEDEMA ASSESSMENTS:   LANDMARK RIGHT   eval Right 11/06/2022  10 cm proximal to olecranon process 27 27  Olecranon process 24.6 24.5  10 cm proximal to ulnar styloid process 22.7 23  Just proximal to ulnar styloid process 16.2 16.3  Across hand at thumb web space 17.3 17.5  At base of 2nd digit 5.9 5.9  (Blank rows = not tested)  LANDMARK LEFT   eval  10 cm proximal to olecranon  process 26.5  Olecranon process 24.9  10 cm proximal to ulnar styloid process 22.5  Just proximal to ulnar styloid process 15.8  Across hand at thumb web space 16.9  At base of 2nd digit 5.9  (Blank rows = not tested)  QUICK DASH SURVEY: 11/11/22: 36.36  12/03/22: 25%  TODAY'S TREATMENT:    12/07/22: Therapeutic Exercises Pulleys into flexion x 2 mins and then scaption x 2 min  Yellow band row 2 x 10  Extension yellow 2x10 Back against wall Bil ER yellow x 10 Back against the wall: snow angel x 6, alternating flexion x 6 Supine chest press with 2# x 10 Long arm c/cc 2# x 10 Rt only Manual therapy P/ROM of Rt shoulder into flexion, abd and D2  to pts tolerance  STM to Rt pectoralis and lat  12/03/22: Therapeutic Exercises Pulleys into flexion x 2 mins and then scaption x 2 min  Yellow band row 2 x 10  Back against wall Bil ER yellow x 10 Back against the wall: snow angel x 6, alternating flexion x 6 Supine chest press with 2# x 10 Sidelying abduction x 6 Manual therapy P/ROM of Rt shoulder into flexion, abd and D2  to pts tolerance  STM to Rt pectoralis and lat  12/01/22: Therapeutic Exercises Pulleys into flexion x 2 mins and then scaption x 2 min  Yellow band row 2 x 10  Yellow band ext x 10  Seated Bil ER yellow x 10 Supine chest press with 2# on dowel x 10 Supine flexion with dowel 2 x 5 Sidelying abduction x 6 Manual therapy P/ROM of Rt shoulder into flexion, abd and D2  to pts tolerance  STM to Rt pectoralis tendon where pt reports feeling very tight with end motions MFR to Rt chest wall during end P/ROMs  11/24/22: Therapeutic Exercises Pulleys into flexion x 2 mins and then scaption x 2 min  Yellow band row 2 x 10  Yellow band ext x 10  Seated Bil ER yellow x 10 Doorway pectoralis stretch 4x in varying positions x 20 sec holds Supine chest press with 2# on dowel x 10 Supine flexion with dowel 2 x 5 Manual therapy P/ROM of Rt shoulder into flexion, abd and  D2  to pts tolerance with scapular depression by therapist throughout gently as pt was tender along spine of scapula STM to Rt pectoralis tendon where pt reports feeling very tight with end motions MFR to Rt chest wall during end P/ROMs  11/19/22: Therapeutic Exercises Pulleys into flexion x 2 mins and then scaption x 2 min  Yellow band row x 10  Yellow and ext x 10  Seated Bil ER yellow x 6 Supine dowel flexion x 5 Supine extension presses 5" x 6  Education on postural corrections to make per pt request: discussed ears in line with shoulders and chin down and in line Updated HEP and gave yellow band  Manual therapy P/ROM of Rt shoulder into flexion, abd and D2  to pts tolerance,   PATIENT EDUCATION:  Education details: Lymphedema risk reduction and post op shoulder/posture HEP Person educated: Patient Education method: Explanation, Demonstration, Handout Education comprehension: Patient verbalized understanding and returned demonstration  HOME EXERCISE PROGRAM: Patient was instructed today in a home exercise program today for post op shoulder range of motion. These included active assist shoulder flexion in sitting, scapular retraction, wall walking with shoulder abduction, and hands behind head external rotation.  She was encouraged to do these twice a day, holding 3 seconds and repeating 5 times when permitted by her physician.  Access Code: [email or web address] URL: [email or web address] Date: 11/19/2022 Prepared by: Gwenevere Abbot  Exercises - Seated Bilateral Shoulder External Rotation with Resistance  - 1 x daily - 3-4 x weekly - 1-3 sets - 10 reps - Standing Row with Anchored Resistance  - 1 x daily - 3-4 x weekly - 1-3 sets - 10 reps - Single Arm  Shoulder Extension with Anchored Resistance  - 1 x daily - 3-4 x weekly - 1-3 sets - 10 reps Supine extension presses 5" x 6   CLINICAL IMPRESSION: Pt is now only 5deg from abduction ROM.   Pt will benefit from skilled  therapeutic intervention to improve on the following deficits: Decreased knowledge of precautions, impaired UE functional use, pain, decreased ROM, postural dysfunction.   PT treatment/interventions: ADL/self-care home management, pt/family education, therapeutic exercise  REHAB POTENTIAL: Excellent  CLINICAL DECISION MAKING: Stable/uncomplicated  EVALUATION COMPLEXITY: Low   GOALS: Goals reviewed with patient? YES  LONG TERM GOALS: (STG=LTG)    Name Target Date Goal status  1 Pt will be able to verbalize understanding of pertinent lymphedema risk reduction practices relevant to her dx specifically related to skin care.  Baseline:  No knowledge 12/07/2022 Achieved at eval  2 Pt will be able to return demo and/or verbalize understanding of the post op HEP related to regaining shoulder ROM. Baseline:  No knowledge 12/07/2022 Achieved at eval  3 Pt will be able to verbalize understanding of the importance of attending the post op After Breast CA Class for further lymphedema risk reduction education and therapeutic exercise.  Baseline:  No knowledge 12/07/2022 Achieved at eval  4 Pt will demo she has regained full shoulder ROM and function post operatively compared to baselines.  Baseline: See objective measurements taken today. 12/26/2023 Ongoing   5  Pt will be improve functional strength of her right arm so that she able to put dishes on the second shelf of her cabinet without pain  12/26/2022 MET    PLAN:  PT FREQUENCY/DURATION: 2 times a week for 4 weeks   PLAN FOR NEXT SESSION: Cont Therapeutic exercise for ROM and strength of right shoulder, manual work PRN,  NO SOZO due to brain stimulator    Idamae Lusher, PT 12/07/2022, 11:05 AM  [facility] [address] [place], [place], [address] Phone: [phone]   Fax:  [phone]

## 2022-12-16 ENCOUNTER — Ambulatory Visit: Payer: Medicare PPO | Admitting: Rehabilitation

## 2022-12-16 DIAGNOSIS — Z170 Estrogen receptor positive status [ER+]: Secondary | ICD-10-CM | POA: Diagnosis not present

## 2022-12-16 DIAGNOSIS — M25611 Stiffness of right shoulder, not elsewhere classified: Secondary | ICD-10-CM | POA: Diagnosis not present

## 2022-12-16 DIAGNOSIS — E785 Hyperlipidemia, unspecified: Secondary | ICD-10-CM | POA: Diagnosis not present

## 2022-12-16 DIAGNOSIS — C50211 Malignant neoplasm of upper-inner quadrant of right female breast: Secondary | ICD-10-CM | POA: Diagnosis not present

## 2022-12-16 DIAGNOSIS — R293 Abnormal posture: Secondary | ICD-10-CM | POA: Diagnosis not present

## 2022-12-16 DIAGNOSIS — Z8673 Personal history of transient ischemic attack (TIA), and cerebral infarction without residual deficits: Secondary | ICD-10-CM | POA: Diagnosis not present

## 2022-12-16 DIAGNOSIS — Z483 Aftercare following surgery for neoplasm: Secondary | ICD-10-CM | POA: Diagnosis not present

## 2022-12-16 DIAGNOSIS — G20C Parkinsonism, unspecified: Secondary | ICD-10-CM | POA: Diagnosis not present

## 2022-12-16 DIAGNOSIS — G250 Essential tremor: Secondary | ICD-10-CM | POA: Diagnosis not present

## 2022-12-16 DIAGNOSIS — M8000XD Age-related osteoporosis with current pathological fracture, unspecified site, subsequent encounter for fracture with routine healing: Secondary | ICD-10-CM | POA: Diagnosis not present

## 2022-12-16 NOTE — Therapy (Signed)
OUTPATIENT PHYSICAL THERAPY BREAST CANCER TREATMENT   Patient Name: Jodi Medina MRN: [id number] DOB:[birth year], 82 y.o., female Today's Date: 12/16/2022  END OF SESSION:       Past Medical History:  Diagnosis Date   Arthritis    Breast cancer (HCC)    Diverticulitis    High cholesterol    High triglycerides    Lactose intolerance    Skin cancer    Torn meniscus    bilateral   Tremor    Past Surgical History:  Procedure Laterality Date   BREAST BIOPSY Right 09/22/2022   [place] RT BREAST BX W LOC DEV 1ST LESION IMG BX SPEC US GUIDE 09/22/2022 [facility]   BREAST BIOPSY Right 09/22/2022   [place] RT BREAST BX W LOC DEV EA ADD LESION IMG BX SPEC US GUIDE 09/22/2022 [facility]   BREAST EXCISIONAL BIOPSY Left 1990   CATARACT EXTRACTION Right 05/2022   CATARACT EXTRACTION Left 08/2022   COLONOSCOPY  2019   DEEP BRAIN STIMULATOR PLACEMENT  01/2021   DEEP BRAIN STIMULATOR PLACEMENT  02/2021   DENTAL SURGERY  2017   DILATION AND CURETTAGE OF UTERUS  1990   INJECTION KNEE Right 2020   "gel"   MASTECTOMY W/ SENTINEL NODE BIOPSY Right 10/13/2022   Procedure: RIGHT MASTECTOMY WITH AXILLARY SENTINEL LYMPH NODE BIOPSY;  Surgeon: [name], MD;  Location: [facility];  Service: General;  Laterality: Right;   microscopic knee surgery Left 12/2017   for torn meniscus   MULTIPLE TOOTH EXTRACTIONS  07/2022   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 [name]  REFERRING DIAG: Right breast cancer  THERAPY DIAG:  No diagnosis found.  Rationale for Evaluation and Treatment: Rehabilitation  ONSET DATE: 09/18/2022  SUBJECTIVE:                                                                                                                                                                                           SUBJECTIVE STATEMENT: I felt fine when I wasn't coming but I didn't  really do anything.  I never felt any limitations.  It didn't feel back to normal.    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 It is ER positive in 1 mass and ER negative in another mass. Both areas are PR positive and HER2 negative with a Ki67 of 5-20%. She has Parkinson's and a deep brain stimulator for reducing tremors. She also has a hx of cerebellar CVA. On 5/ 21/2014 she had right breast mastectomy with removal of  2 lymph nodes negative for metastatic carcinoma. She will not be proceeding with adjuvant radiaiton   PATIENT GOALS:   reduce lymphedema risk and learn post op HEP.   PAIN:  Are you having pain? No overall but some intermittent twinges.    PRECAUTIONS: at risk for lymphedema   HAND DOMINANCE: right  WEIGHT BEARING RESTRICTIONS: No  FALLS:  Has patient fallen in last 6 months? No  LIVING ENVIRONMENT: Patient lives with: her husband Lives in: House/apartment Has following equipment at home: None  OCCUPATION: Retired  LEISURE: She walks twice a day to take her dog for walks  PRIOR LEVEL OF FUNCTION: Independent   OBJECTIVE: OBSERVATION :  pt with well approximated  healing incsion across right chest.  She has an area of fullness at lateral aspect with incision deep inside a skin fold.  There is no drainage or odor. Pt instructed to adjust compression bra so that is covers and does not "dig in" to this area and limit the decongestion of the skin fold.       COGNITION: Overall cognitive status: Within functional limits for tasks assessed    POSTURE:  Forward head and rounded shoulders posture  UPPER EXTREMITY AROM/PROM:  A/PROM RIGHT   eval  Right  11/06/2022 11/19/22 12/01/22 12/07/22 12/16/22  Shoulder extension 46   40    Shoulder flexion 135 135  140 140 140  Shoulder abduction 165 90 140 140 - tight back of the shoulder  160 156  Shoulder internal rotation 71 60      Shoulder external rotation 79 90        (Blank rows = not tested)  A/PROM LEFT   eval  Shoulder extension 36  Shoulder flexion 143  Shoulder abduction 170  Shoulder internal rotation 75  Shoulder external rotation 85    (Blank rows = not tested)  LYMPHEDEMA ASSESSMENTS:   LANDMARK RIGHT   eval Right 11/06/2022  10 cm proximal to olecranon process 27 27  Olecranon process 24.6 24.5  10 cm proximal to ulnar styloid process 22.7 23  Just proximal to ulnar styloid process 16.2 16.3  Across hand at thumb web space 17.3 17.5  At base of 2nd digit 5.9 5.9  (Blank rows = not tested)  LANDMARK LEFT   eval  10 cm proximal to olecranon process 26.5  Olecranon process 24.9  10 cm proximal to ulnar styloid process 22.5  Just proximal to ulnar styloid process 15.8  Across hand at thumb web space 16.9  At base of 2nd digit 5.9  (Blank rows = not tested)  QUICK DASH SURVEY: 11/11/22: 36.36  12/03/22: 25%  TODAY'S TREATMENT:    12/16/22: Therapeutic Exercises  Pulleys into flexion x 2 mins and then scaption x 2 min  Yellow band row 2 x 10  Extension yellow 2x10 Back against wall Bil ER yellow x 10 Back against the wall: alternating flexion x 6 chest press with 3# x  10 Long arm c/cc 2# x 10 Rt only, 0# for left due to increased tremor with 3# Manual therapy P/ROM of Rt shoulder into flexion, abd and D2  to pts tolerance   12/07/22: Therapeutic Exercises Pulleys into flexion x 2 mins and then scaption x 2 min  Yellow band row 2 x 10  Extension yellow 2x10 Back against wall Bil ER yellow x 10 Back against the wall: snow angel x 6, alternating flexion x 6 Supine chest press with 2# x 10 Long arm c/cc 2# x 10 Rt only Manual therapy P/ROM of Rt shoulder into flexion, abd and D2  to pts tolerance  STM to Rt pectoralis and lat  12/03/22: Therapeutic Exercises Pulleys into flexion x 2 mins and then scaption x 2 min  Yellow band row 2 x 10  Back against wall Bil ER yellow x 10 Back against the wall: snow angel x 6, alternating flexion x 6 Supine chest press with 2# x 10 Sidelying abduction x 6 Manual therapy P/ROM of Rt shoulder into flexion, abd and D2  to pts tolerance  STM to Rt pectoralis and lat  12/01/22: Therapeutic Exercises Pulleys into flexion x 2 mins and then scaption x 2 min  Yellow band row 2 x 10  Yellow band ext x 10  Seated Bil ER yellow x 10 Supine chest press with 2# on dowel x 10 Supine flexion with dowel 2 x 5 Sidelying abduction x 6 Manual therapy P/ROM of Rt shoulder into flexion, abd and D2  to pts tolerance  STM to Rt pectoralis tendon where pt reports feeling very tight with end motions MFR to Rt chest wall during end P/ROMs  11/24/22: Therapeutic Exercises Pulleys into flexion x 2 mins and then scaption x 2 min  Yellow band row 2 x 10  Yellow band ext x 10  Seated Bil ER yellow x 10 Doorway pectoralis stretch 4x in varying positions x 20 sec holds Supine chest press with 2# on dowel x 10 Supine flexion with dowel 2 x 5 Manual therapy P/ROM of Rt shoulder into flexion, abd and D2  to pts tolerance with scapular depression by therapist throughout gently as pt was tender along spine of scapula STM to Rt pectoralis  tendon where pt reports feeling very tight with end motions MFR to Rt chest wall during end P/ROMs  11/19/22: Therapeutic Exercises Pulleys into flexion x 2 mins and then scaption x 2 min  Yellow band row x 10  Yellow and ext x 10  Seated Bil ER yellow x 6 Supine dowel flexion x 5 Supine extension presses 5" x 6  Education on postural corrections to make per pt request: discussed ears in line with shoulders and chin down and in line Updated HEP and gave yellow band  Manual therapy P/ROM of Rt shoulder into flexion, abd and D2  to pts tolerance,   PATIENT EDUCATION:  Education details: Lymphedema risk reduction and post op shoulder/posture HEP Person educated: Patient Education method: Explanation, Demonstration, Handout Education comprehension: Patient verbalized understanding and returned demonstration  HOME EXERCISE PROGRAM: Patient was instructed today in a home exercise program today for post op shoulder range of motion. These included active assist shoulder flexion in sitting,  scapular retraction, wall walking with shoulder abduction, and hands behind head external rotation.  She was encouraged to do these twice a day, holding 3 seconds and repeating 5 times when permitted by her physician.  Access Code: [email or web address] URL: [email or web address] Date: 11/19/2022 Prepared by: Gwenevere Abbot  Exercises - Seated Bilateral Shoulder External Rotation with Resistance  - 1 x daily - 3-4 x weekly - 1-3 sets - 10 reps - Standing Row with Anchored Resistance  - 1 x daily - 3-4 x weekly - 1-3 sets - 10 reps - Single Arm Shoulder Extension with Anchored Resistance  - 1 x daily - 3-4 x weekly - 1-3 sets - 10 reps Supine extension presses 5" x 6   CLINICAL IMPRESSION: Pt did well with small break from PT.  She did not feel limited with her activities but did not feel "normal".  We will continue her POC.   Pt will benefit from skilled therapeutic intervention to improve on the  following deficits: Decreased knowledge of precautions, impaired UE functional use, pain, decreased ROM, postural dysfunction.   PT treatment/interventions: ADL/self-care home management, pt/family education, therapeutic exercise  REHAB POTENTIAL: Excellent  CLINICAL DECISION MAKING: Stable/uncomplicated  EVALUATION COMPLEXITY: Low   GOALS: Goals reviewed with patient? YES  LONG TERM GOALS: (STG=LTG)    Name Target Date Goal status  1 Pt will be able to verbalize understanding of pertinent lymphedema risk reduction practices relevant to her dx specifically related to skin care.  Baseline:  No knowledge 12/16/2022 Achieved at eval  2 Pt will be able to return demo and/or verbalize understanding of the post op HEP related to regaining shoulder ROM. Baseline:  No knowledge 12/16/2022 Achieved at eval  3 Pt will be able to verbalize understanding of the importance of attending the post op After Breast CA Class for further lymphedema risk reduction education and therapeutic exercise.  Baseline:  No knowledge 12/16/2022 Achieved at eval  4 Pt will demo she has regained full shoulder ROM and function post operatively compared to baselines.  Baseline: See objective measurements taken today. 12/26/2023 Ongoing   5  Pt will be improve functional strength of her right arm so that she able to put dishes on the second shelf of her cabinet without pain  12/26/2022 MET    PLAN:  PT FREQUENCY/DURATION: 2 times a week for 4 weeks   PLAN FOR NEXT SESSION: Cont Therapeutic exercise for ROM and strength of right shoulder, manual work PRN,  NO SOZO due to brain stimulator    Idamae Lusher, PT 12/16/2022, 2:01 PM  [facility] [address] [place], [place], [address] Phone: [phone]   Fax:  [phone]

## 2022-12-21 ENCOUNTER — Ambulatory Visit: Payer: Medicare PPO

## 2022-12-21 DIAGNOSIS — Z483 Aftercare following surgery for neoplasm: Secondary | ICD-10-CM

## 2022-12-21 DIAGNOSIS — Z170 Estrogen receptor positive status [ER+]: Secondary | ICD-10-CM | POA: Diagnosis not present

## 2022-12-21 DIAGNOSIS — C50211 Malignant neoplasm of upper-inner quadrant of right female breast: Secondary | ICD-10-CM | POA: Diagnosis not present

## 2022-12-21 DIAGNOSIS — R293 Abnormal posture: Secondary | ICD-10-CM | POA: Diagnosis not present

## 2022-12-21 DIAGNOSIS — M25611 Stiffness of right shoulder, not elsewhere classified: Secondary | ICD-10-CM

## 2022-12-21 NOTE — Therapy (Addendum)
OUTPATIENT PHYSICAL THERAPY BREAST CANCER TREATMENT   Patient Name: Jodi Medina MRN: [id number] DOB:[birth year], 82 y.o., female Today's Date: 12/21/2022  END OF SESSION:  PT End of Session - 12/21/22 1208     Visit Number 12    Number of Visits 16    Date for PT Re-Evaluation 01/07/23    Authorization Type 8 visits 12/07/22-01/07/23    Authorization - Visit Number 3    Authorization - Number of Visits 8    PT Start Time 1206    PT Stop Time 1300    PT Time Calculation (min) 54 min    Activity Tolerance Patient tolerated treatment well    Behavior During Therapy WFL for tasks assessed/performed                 Past Medical History:  Diagnosis Date   Arthritis    Breast cancer (HCC)    Diverticulitis    High cholesterol    High triglycerides    Lactose intolerance    Skin cancer    Torn meniscus    bilateral   Tremor    Past Surgical History:  Procedure Laterality Date   BREAST BIOPSY Right 09/22/2022   [place] RT BREAST BX W LOC DEV 1ST LESION IMG BX SPEC US GUIDE 09/22/2022 [facility]   BREAST BIOPSY Right 09/22/2022   [place] RT BREAST BX W LOC DEV EA ADD LESION IMG BX SPEC US GUIDE 09/22/2022 [facility]   BREAST EXCISIONAL BIOPSY Left 1990   CATARACT EXTRACTION Right 05/2022   CATARACT EXTRACTION Left 08/2022   COLONOSCOPY  2019   DEEP BRAIN STIMULATOR PLACEMENT  01/2021   DEEP BRAIN STIMULATOR PLACEMENT  02/2021   DENTAL SURGERY  2017   DILATION AND CURETTAGE OF UTERUS  1990   INJECTION KNEE Right 2020   "gel"   MASTECTOMY W/ SENTINEL NODE BIOPSY Right 10/13/2022   Procedure: RIGHT MASTECTOMY WITH AXILLARY SENTINEL LYMPH NODE BIOPSY;  Surgeon: [name], MD;  Location: [facility] OR;  Service: General;  Laterality: Right;   microscopic knee surgery Left 12/2017   for torn meniscus   MULTIPLE TOOTH EXTRACTIONS  07/2022   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 [name]  REFERRING DIAG: Right breast cancer  THERAPY DIAG:  Stiffness of right shoulder, not elsewhere classified  Malignant neoplasm of upper-inner quadrant of right breast in female, estrogen receptor positive (HCC)  Abnormal posture  Aftercare following surgery for neoplasm  Rationale for Evaluation and Treatment: Rehabilitation  ONSET DATE: 09/18/2022  SUBJECTIVE:  SUBJECTIVE STATEMENT: Overall my Rt shoulder is feeling better. I can tell it's a bit stiffer when I don't do my exercises as often as I should. The intermittent twinges are getting better as well.   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 It is ER positive in 1 mass and ER negative in another mass. Both areas are PR positive and HER2 negative with a Ki67 of 5-20%. She has Parkinson's and a deep brain stimulator for reducing tremors. She also has a hx of cerebellar CVA. On 5/ 21/2014 she had right breast mastectomy with  removal of  2 lymph nodes negative for metastatic carcinoma. She will not be proceeding with adjuvant radiaiton   PATIENT GOALS:   reduce lymphedema risk and learn post op HEP.   PAIN:  Are you having pain? No  PRECAUTIONS: at risk for lymphedema   HAND DOMINANCE: right  WEIGHT BEARING RESTRICTIONS: No  FALLS:  Has patient fallen in last 6 months? No  LIVING ENVIRONMENT: Patient lives with: her husband Lives in: House/apartment Has following equipment at home: None  OCCUPATION: Retired  LEISURE: She walks twice a day to take her dog for walks  PRIOR LEVEL OF FUNCTION: Independent   OBJECTIVE: OBSERVATION :  pt with well approximated healing incsion across right chest.  She has an area of fullness at lateral aspect with incision deep inside a skin fold.  There is no drainage or odor. Pt instructed to adjust compression bra so that is covers and does not "dig in" to this area and limit the decongestion of the skin fold.       COGNITION: Overall cognitive status: Within functional limits for tasks assessed    POSTURE:  Forward head and rounded shoulders posture  UPPER EXTREMITY AROM/PROM:  A/PROM RIGHT   eval  Right  11/06/2022 11/19/22 12/01/22 12/07/22 12/16/22  Shoulder extension 46   40    Shoulder flexion 135 135  140 140 140  Shoulder abduction 165 90 140 140 - tight back of the shoulder  160 156  Shoulder internal rotation 71 60      Shoulder external rotation 79 90        (Blank rows = not tested)  A/PROM LEFT   eval  Shoulder extension 36  Shoulder flexion 143  Shoulder abduction 170  Shoulder internal rotation 75  Shoulder external rotation 85    (Blank rows = not tested)  LYMPHEDEMA ASSESSMENTS:   LANDMARK RIGHT   eval Right 11/06/2022  10 cm proximal to olecranon process 27 27  Olecranon process 24.6 24.5  10 cm proximal to ulnar styloid process 22.7 23  Just proximal to ulnar styloid process 16.2 16.3  Across hand at thumb web space 17.3 17.5   At base of 2nd digit 5.9 5.9  (Blank rows = not tested)  LANDMARK LEFT   eval  10 cm proximal to olecranon process 26.5  Olecranon process 24.9  10 cm proximal to ulnar styloid process 22.5  Just proximal to ulnar styloid process 15.8  Across hand at thumb web space 16.9  At base of 2nd digit 5.9  (Blank rows = not tested)  QUICK DASH SURVEY: 11/11/22: 36.36  12/03/22: 25%  TODAY'S TREATMENT:    12/21/22; Therapeutic Exercises Pulleys into flexion x 2 mins and then abduction x 2 min Roll yellow ball up wall x 10 into flexion Red theraband row and extensions x 20 each, then seated bil er with red theraband 2 x 10 Supine on mat for Long arm c/cc 2# x 10 each direction and each arm (pt does tremor some with LT but reports grip strength feels strong); then chest press with 3# on dowel x 20 Manual therapy P/ROM of Rt shoulder into flexion, abd and D2  to pts tolerance  MFR to Rt pectoralis tendon during end P/ROM STM to scar tissue in Rt axilla and at anterior shoulder where pt would have occasional pain during certain positions on P/ROM  12/16/22: Therapeutic Exercises Pulleys into flexion x 2 mins and then scaption x 2 min  Yellow band row 2 x 10  Extension yellow 2x10 Back against wall Bil ER yellow x 10 Back against the wall: alternating flexion x 6 chest press with 3# x 10 Long arm c/cc 2# x 10 Rt only, 0# for left due to increased tremor with 3# Manual therapy P/ROM of Rt shoulder into flexion, abd and D2  to pts tolerance   12/07/22: Therapeutic Exercises Pulleys into flexion x 2 mins and then scaption x 2 min  Yellow band row 2 x 10  Extension yellow 2x10 Back against wall Bil ER yellow x 10 Back against the wall: snow angel x 6, alternating flexion x 6 Supine chest press with 2# x 10 Long arm c/cc 2# x 10 Rt only Manual therapy P/ROM of Rt shoulder into flexion, abd and D2  to pts tolerance  STM to Rt pectoralis and lat     PATIENT EDUCATION:  Education  details: Lymphedema risk reduction and post op shoulder/posture HEP Person educated: Patient Education method: Explanation, Demonstration, Handout Education comprehension: Patient verbalized understanding and returned demonstration  HOME EXERCISE PROGRAM: Patient was instructed today in a home exercise program today for post op shoulder range of motion. These included active assist shoulder flexion in sitting, scapular retraction, wall walking with shoulder abduction, and hands behind head external rotation. She was encouraged to do these twice a day, holding 3 seconds and repeating 5 times when permitted by her physician    Access Code: [email or web address] URL: [email or web address] Date: 11/19/2022 Prepared by: Gwenevere Abbot  Exercises - Seated Bilateral Shoulder External Rotation with Resistance  - 1 x daily - 3-4 x weekly - 1-3 sets - 10 reps - Standing Row with Anchored Resistance  - 1 x daily - 3-4 x weekly - 1-3 sets - 10 reps - Single Arm Shoulder Extension with Anchored Resistance  - 1 x daily - 3-4 x weekly - 1-3 sets - 10 reps Supine extension presses 5" x 6   CLINICAL IMPRESSION: Patient reports she is doing the HEP fairly frequently, not quite twice a day but at least once a day. Pt reports she just forgets at times. Spoke with her about placing the exs where she would see them like on her fridge or bathroom mirror, to help her remember. She did well with progression of theraband from yellow to red.   Pt will benefit from skilled therapeutic intervention to improve on the following deficits: Decreased knowledge of precautions, impaired UE functional use, pain, decreased ROM, postural dysfunction.   PT treatment/interventions: ADL/self-care home management, pt/family education, therapeutic exercise  REHAB POTENTIAL: Excellent  CLINICAL DECISION MAKING: Stable/uncomplicated  EVALUATION COMPLEXITY:  Low   GOALS: Goals reviewed with patient? YES  LONG TERM GOALS:  (STG=LTG)    Name Target Date Goal status  1 Pt will be able to verbalize understanding of pertinent lymphedema risk reduction practices relevant to her dx specifically related to skin care.  Baseline:  No knowledge 12/21/2022 Achieved at eval  2 Pt will be able to return demo and/or verbalize understanding of the post op HEP related to regaining shoulder ROM. Baseline:  No knowledge 12/21/2022 Achieved at eval  3 Pt will be able to verbalize understanding of the importance of attending the post op After Breast CA Class for further lymphedema risk reduction education and therapeutic exercise.  Baseline:  No knowledge 12/21/2022 Achieved at eval  4 Pt will demo she has regained full shoulder ROM and function post operatively compared to baselines.  Baseline: See objective measurements taken today. 12/26/2023 Ongoing   5  Pt will be improve functional strength of her right arm so that she able to put dishes on the second shelf of her cabinet without pain  12/26/2022 MET    PLAN:  PT FREQUENCY/DURATION: 2 times a week for 4 weeks   PLAN FOR NEXT SESSION: Cont Therapeutic exercise for ROM and strength of right shoulder, manual work PRN,  NO SOZO due to brain stimulator    Hermenia Bers, PTA 12/21/2022, 1:11 PM  [facility] [address] [place], [place], [address] Phone: [phone]   Fax:  [phone]

## 2022-12-24 ENCOUNTER — Ambulatory Visit: Payer: Medicare PPO | Attending: General Surgery

## 2022-12-24 DIAGNOSIS — R293 Abnormal posture: Secondary | ICD-10-CM | POA: Diagnosis not present

## 2022-12-24 DIAGNOSIS — C50211 Malignant neoplasm of upper-inner quadrant of right female breast: Secondary | ICD-10-CM | POA: Diagnosis not present

## 2022-12-24 DIAGNOSIS — M25611 Stiffness of right shoulder, not elsewhere classified: Secondary | ICD-10-CM | POA: Insufficient documentation

## 2022-12-24 DIAGNOSIS — Z483 Aftercare following surgery for neoplasm: Secondary | ICD-10-CM | POA: Diagnosis not present

## 2022-12-24 DIAGNOSIS — Z170 Estrogen receptor positive status [ER+]: Secondary | ICD-10-CM | POA: Diagnosis not present

## 2022-12-24 NOTE — Therapy (Signed)
OUTPATIENT PHYSICAL THERAPY BREAST CANCER TREATMENT   Patient Name: Jodi Medina MRN: [id number] DOB:June 02, 1940, 82 y.o., female Today's Date: 12/24/2022  END OF SESSION:  PT End of Session - 12/24/22 0909     Visit Number 13    Number of Visits 16    Date for PT Re-Evaluation 01/07/23    Authorization Type 8 visits 12/07/22-01/07/23    Authorization - Visit Number 4    Authorization - Number of Visits 8    PT Start Time 0906    PT Stop Time 1003    PT Time Calculation (min) 57 min    Activity Tolerance Patient tolerated treatment well    Behavior During Therapy WFL for tasks assessed/performed                 Past Medical History:  Diagnosis Date   Arthritis    Breast cancer (HCC)    Diverticulitis    High cholesterol    High triglycerides    Lactose intolerance    Skin cancer    Torn meniscus    bilateral   Tremor    Past Surgical History:  Procedure Laterality Date   BREAST BIOPSY Right 09/22/2022   [place] RT BREAST BX W LOC DEV 1ST LESION IMG BX SPEC US GUIDE 09/22/2022 [facility]   BREAST BIOPSY Right 09/22/2022   [place] RT BREAST BX W LOC DEV EA ADD LESION IMG BX SPEC US GUIDE 09/22/2022 [facility]   BREAST EXCISIONAL BIOPSY Left 1990   CATARACT EXTRACTION Right 05/2022   CATARACT EXTRACTION Left 08/2022   COLONOSCOPY  2019   DEEP BRAIN STIMULATOR PLACEMENT  01/2021   DEEP BRAIN STIMULATOR PLACEMENT  02/2021   DENTAL SURGERY  2017   DILATION AND CURETTAGE OF UTERUS  1990   INJECTION KNEE Right 2020   "gel"   MASTECTOMY W/ SENTINEL NODE BIOPSY Right 10/13/2022   Procedure: RIGHT MASTECTOMY WITH AXILLARY SENTINEL LYMPH NODE BIOPSY;  Surgeon: [name], MD;  Location: [facility] OR;  Service: General;  Laterality: Right;   microscopic knee surgery Left 12/2017   for torn meniscus   MULTIPLE TOOTH EXTRACTIONS  07/2022   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 [name]  REFERRING DIAG: Right breast cancer  THERAPY DIAG:  Stiffness of right shoulder, not elsewhere classified  Malignant neoplasm of upper-inner quadrant of right breast in female, estrogen receptor positive (HCC)  Abnormal posture  Aftercare following surgery for neoplasm  Rationale for Evaluation and Treatment: Rehabilitation  ONSET DATE: 09/18/2022  SUBJECTIVE:  SUBJECTIVE STATEMENT: I had a little soreness at my chest after last session but that was it. I'm tired this morning, I've been up since 3 AM. I just don't sleep good.  I see Dr. Dwain Sarna for my final check up with him tomorrow .   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 It is ER positive in 1 mass and ER negative in another mass. Both areas are PR positive and HER2 negative with a Ki67 of 5-20%. She has Parkinson's and a deep brain stimulator for reducing tremors. She also has a hx of cerebellar CVA. On 5/ 21/2014 she had  right breast mastectomy with removal of  2 lymph nodes negative for metastatic carcinoma. She will not be proceeding with adjuvant radiaiton   PATIENT GOALS:   reduce lymphedema risk and learn post op HEP.   PAIN:  Are you having pain? No  PRECAUTIONS: at risk for lymphedema   HAND DOMINANCE: right  WEIGHT BEARING RESTRICTIONS: No  FALLS:  Has patient fallen in last 6 months? No  LIVING ENVIRONMENT: Patient lives with: her husband Lives in: House/apartment Has following equipment at home: None  OCCUPATION: Retired  LEISURE: She walks twice a day to take her dog for walks  PRIOR LEVEL OF FUNCTION: Independent   OBJECTIVE: OBSERVATION :  pt with well approximated healing incsion across right chest.  She has an area of fullness at lateral aspect with incision deep inside a skin fold.  There is no drainage or odor. Pt instructed to adjust compression bra so that is covers and does not "dig in" to this area and limit the decongestion of the skin fold.       COGNITION: Overall cognitive status: Within functional limits for tasks assessed    POSTURE:  Forward head and rounded shoulders posture  UPPER EXTREMITY AROM/PROM:  A/PROM RIGHT   eval  Right  11/06/2022 11/19/22 12/01/22 12/07/22 12/16/22  Shoulder extension 46   40    Shoulder flexion 135 135  140 140 140  Shoulder abduction 165 90 140 140 - tight back of the shoulder  160 156  Shoulder internal rotation 71 60      Shoulder external rotation 79 90        (Blank rows = not tested)  A/PROM LEFT   eval  Shoulder extension 36  Shoulder flexion 143  Shoulder abduction 170  Shoulder internal rotation 75  Shoulder external rotation 85    (Blank rows = not tested)  LYMPHEDEMA ASSESSMENTS:   LANDMARK RIGHT   eval Right 11/06/2022  10 cm proximal to olecranon process 27 27  Olecranon process 24.6 24.5  10 cm proximal to ulnar styloid process 22.7 23  Just proximal to ulnar styloid process 16.2 16.3  Across hand  at thumb web space 17.3 17.5  At base of 2nd digit 5.9 5.9  (Blank rows = not tested)  LANDMARK LEFT   eval  10 cm proximal to olecranon process 26.5  Olecranon process 24.9  10 cm proximal to ulnar styloid process 22.5  Just proximal to ulnar styloid process 15.8  Across hand at thumb web space 16.9  At base of 2nd digit 5.9  (  Blank rows = not tested)  QUICK DASH SURVEY: 11/11/22: 36.36  12/03/22: 25%  TODAY'S TREATMENT:    12/24/22: Therapeutic Exercises Pulleys into flexion x 2 mins and then abduction x 2 min Roll yellow ball up wall x 10 into flexion, then Lt abd x  returning therapist demo Red theraband row and extensions x 20 each, then standing with back against wall bil er with red theraband 2 x 10, tactile cues to keep elbows flexed at 90 degrees Supine on mat for Long arm c/cc 2# x 10 each direction and each arm (pt does tremor some with Lt but reports grip strength feels strong); then chest press with 3# on dowel x 20 Manual therapy P/ROM of Rt shoulder into flexion, abd and D2  to pts tolerance  MFR to Rt pectoralis tendon during end P/ROM STM to scar tissue in Rt axilla and at anterior shoulder; then cocoa butter at end of session at Rt chest wall where pt palpably very tight and skin dry, also cont STM to Rt pect tendon with cocoa butter a few more mins, pt still very tender here  12/21/22: Therapeutic Exercises Pulleys into flexion x 2 mins and then abduction x 2 min Roll yellow ball up wall x 10 into flexion Red theraband row and extensions x 20 each, then seated bil er with red theraband 2 x 10 Supine on mat for Long arm c/cc 2# x 10 each direction and each arm (pt does tremor some with LT but reports grip strength feels strong); then chest press with 3# on dowel x 20 Manual therapy P/ROM of Rt shoulder into flexion, abd and D2  to pts tolerance  MFR to Rt pectoralis tendon during end P/ROM STM to scar tissue in Rt axilla and at anterior shoulder where pt would have  occasional pain during certain positions on P/ROM  12/16/22: Therapeutic Exercises Pulleys into flexion x 2 mins and then scaption x 2 min  Yellow band row 2 x 10  Extension yellow 2x10 Back against wall Bil ER yellow x 10 Back against the wall: alternating flexion x 6 chest press with 3# x 10 Long arm c/cc 2# x 10 Rt only, 0# for left due to increased tremor with 3# Manual therapy P/ROM of Rt shoulder into flexion, abd and D2  to pts tolerance       PATIENT EDUCATION:  Education details: Lymphedema risk reduction and post op shoulder/posture HEP Person educated: Patient Education method: Explanation, Demonstration, Handout Education comprehension: Patient verbalized understanding and returned demonstration  HOME EXERCISE PROGRAM: Patient was instructed today in a home exercise program today for post op shoulder range of motion. These included active assist shoulder flexion in sitting, scapular retraction, wall walking with shoulder abduction, and hands behind head external rotation. She was encouraged to do these twice a day, holding 3 seconds and repeating 5 times when permitted by her physician   Access Code: [email or web address] URL: [email or web address] Date: 11/19/2022 Prepared by: Gwenevere Abbot  Exercises - Seated Bilateral Shoulder External Rotation with Resistance  - 1 x daily - 3-4 x weekly - 1-3 sets - 10 reps - Standing Row with Anchored Resistance  - 1 x daily - 3-4 x weekly - 1-3 sets - 10 reps - Single Arm Shoulder Extension with Anchored Resistance  - 1 x daily - 3-4 x weekly - 1-3 sets - 10 reps Supine extension presses 5" x 6   CLINICAL IMPRESSION: Pt reports mild soreness at Rt superior chest after last session  so continued with same exercises today. She continued to do well and progressed bil er to standing against wall. Pt continues with tenderness to Rt pect tendon during manual therapy.   Pt will benefit from skilled therapeutic intervention to improve  on the following deficits: Decreased knowledge of precautions, impaired UE functional use, pain, decreased ROM, postural dysfunction.   PT treatment/interventions: ADL/self-care home management, pt/family education, therapeutic exercise  REHAB POTENTIAL: Excellent  CLINICAL DECISION MAKING: Stable/uncomplicated  EVALUATION COMPLEXITY: Low   GOALS: Goals reviewed with patient? YES  LONG TERM GOALS: (STG=LTG)    Name Target Date Goal status  1 Pt will be able to verbalize understanding of pertinent lymphedema risk reduction practices relevant to her dx specifically related to skin care.  Baseline:  No knowledge 12/24/2022 Achieved at eval  2 Pt will be able to return demo and/or verbalize understanding of the post op HEP related to regaining shoulder ROM. Baseline:  No knowledge 12/24/2022 Achieved at eval  3 Pt will be able to verbalize understanding of the importance of attending the post op After Breast CA Class for further lymphedema risk reduction education and therapeutic exercise.  Baseline:  No knowledge 12/24/2022 Achieved at eval  4 Pt will demo she has regained full shoulder ROM and function post operatively compared to baselines.  Baseline: See objective measurements taken today. 12/26/2023 Ongoing   5  Pt will be improve functional strength of her right arm so that she able to put dishes on the second shelf of her cabinet without pain  12/26/2022 MET    PLAN:  PT FREQUENCY/DURATION: 2 times a week for 4 weeks   PLAN FOR NEXT SESSION: Cont Therapeutic exercise for ROM and strength of right shoulder, manual work PRN,  NO SOZO due to brain stimulator    Hermenia Bers, PTA 12/24/2022, 10:06 AM  [facility] [address] [place], [place], [address] Phone: [phone]   Fax:  [phone]

## 2022-12-25 ENCOUNTER — Other Ambulatory Visit: Payer: Self-pay | Admitting: Internal Medicine

## 2022-12-25 DIAGNOSIS — M810 Age-related osteoporosis without current pathological fracture: Secondary | ICD-10-CM

## 2022-12-29 ENCOUNTER — Encounter: Payer: Self-pay | Admitting: Rehabilitation

## 2022-12-29 ENCOUNTER — Ambulatory Visit: Payer: Medicare PPO | Admitting: Rehabilitation

## 2022-12-29 DIAGNOSIS — C50211 Malignant neoplasm of upper-inner quadrant of right female breast: Secondary | ICD-10-CM | POA: Diagnosis not present

## 2022-12-29 DIAGNOSIS — R293 Abnormal posture: Secondary | ICD-10-CM

## 2022-12-29 DIAGNOSIS — M25611 Stiffness of right shoulder, not elsewhere classified: Secondary | ICD-10-CM

## 2022-12-29 DIAGNOSIS — Z483 Aftercare following surgery for neoplasm: Secondary | ICD-10-CM | POA: Diagnosis not present

## 2022-12-29 DIAGNOSIS — Z170 Estrogen receptor positive status [ER+]: Secondary | ICD-10-CM | POA: Diagnosis not present

## 2022-12-29 NOTE — Therapy (Signed)
OUTPATIENT PHYSICAL THERAPY BREAST CANCER TREATMENT   Patient Name: Jodi Medina MRN: [id number] DOB:[birth year], 82 y.o., female Today's Date: 12/29/2022  END OF SESSION:  PT End of Session - 12/29/22 0905     Visit Number 14    Number of Visits 16    Date for PT Re-Evaluation 01/07/23    Authorization Type 8 visits 12/07/22-01/07/23    Authorization - Visit Number 5    Authorization - Number of Visits 8    PT Start Time 0907    PT Stop Time 0955    PT Time Calculation (min) 48 min    Activity Tolerance Patient tolerated treatment well    Behavior During Therapy [facility] for tasks assessed/performed                 Past Medical History:  Diagnosis Date   Arthritis    Breast cancer (HCC)    Diverticulitis    High cholesterol    High triglycerides    Lactose intolerance    Skin cancer    Torn meniscus    bilateral   Tremor    Past Surgical History:  Procedure Laterality Date   BREAST BIOPSY Right 09/22/2022   [place] RT BREAST BX W LOC DEV 1ST LESION IMG BX SPEC US GUIDE 09/22/2022 [facility]   BREAST BIOPSY Right 09/22/2022   [place] RT BREAST BX W LOC DEV EA ADD LESION IMG BX SPEC US GUIDE 09/22/2022 [facility]   BREAST EXCISIONAL BIOPSY Left 1990   CATARACT EXTRACTION Right 05/2022   CATARACT EXTRACTION Left 08/2022   COLONOSCOPY  2019   DEEP BRAIN STIMULATOR PLACEMENT  01/2021   DEEP BRAIN STIMULATOR PLACEMENT  02/2021   DENTAL SURGERY  2017   DILATION AND CURETTAGE OF UTERUS  1990   INJECTION KNEE Right 2020   "gel"   MASTECTOMY W/ SENTINEL NODE BIOPSY Right 10/13/2022   Procedure: RIGHT MASTECTOMY WITH AXILLARY SENTINEL LYMPH NODE BIOPSY;  Surgeon: [name], MD;  Location: [facility] OR;  Service: General;  Laterality: Right;   microscopic knee surgery Left 12/2017   for torn meniscus   MULTIPLE TOOTH EXTRACTIONS  07/2022   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 [name]  REFERRING DIAG: Right breast cancer  THERAPY DIAG:  Stiffness of right shoulder, not elsewhere classified  Malignant neoplasm of upper-inner quadrant of right breast in female, estrogen receptor positive (HCC)  Abnormal posture  Aftercare following surgery for neoplasm  Rationale for Evaluation and Treatment: Rehabilitation  ONSET DATE: 09/18/2022  SUBJECTIVE:  SUBJECTIVE STATEMENT:  I am doing okay.  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 It is ER positive in 1 mass and ER negative in another mass. Both areas are PR positive and HER2 negative with a Ki67 of 5-20%. She has Parkinson's and a deep brain stimulator for reducing tremors. She also has a hx of cerebellar CVA. On 5/ 21/2014 she had right breast mastectomy with removal of  2 lymph nodes negative for metastatic carcinoma. She will not be proceeding with adjuvant  radiaiton   PATIENT GOALS:   reduce lymphedema risk and learn post op HEP.   PAIN:  Are you having pain? No  PRECAUTIONS: at risk for lymphedema   HAND DOMINANCE: right  WEIGHT BEARING RESTRICTIONS: No  FALLS:  Has patient fallen in last 6 months? No  LIVING ENVIRONMENT: Patient lives with: her husband Lives in: House/apartment Has following equipment at home: None  OCCUPATION: Retired  LEISURE: She walks twice a day to take her dog for walks  PRIOR LEVEL OF FUNCTION: Independent   OBJECTIVE: OBSERVATION :  pt with well approximated healing incsion across right chest.  She has an area of fullness at lateral aspect with incision deep inside a skin fold.  There is no drainage or odor. Pt instructed to adjust compression bra so that is covers and does not "dig in" to this area and limit the decongestion of the skin fold.       COGNITION: Overall cognitive status: Within functional limits for tasks assessed    POSTURE:  Forward head and rounded shoulders posture  UPPER EXTREMITY AROM/PROM:  A/PROM RIGHT   eval  Right  11/06/2022 11/19/22 12/01/22 12/07/22 12/16/22  Shoulder extension 46   40    Shoulder flexion 135 135  140 140 140  Shoulder abduction 165 90 140 140 - tight back of the shoulder  160 156  Shoulder internal rotation 71 60      Shoulder external rotation 79 90        (Blank rows = not tested)  A/PROM LEFT   eval  Shoulder extension 36  Shoulder flexion 143  Shoulder abduction 170  Shoulder internal rotation 75  Shoulder external rotation 85    (Blank rows = not tested)  LYMPHEDEMA ASSESSMENTS:   LANDMARK RIGHT   eval Right 11/06/2022  10 cm proximal to olecranon process 27 27  Olecranon process 24.6 24.5  10 cm proximal to ulnar styloid process 22.7 23  Just proximal to ulnar styloid process 16.2 16.3  Across hand at thumb web space 17.3 17.5  At base of 2nd digit 5.9 5.9  (Blank rows = not tested)  LANDMARK LEFT   eval  10 cm proximal  to olecranon process 26.5  Olecranon process 24.9  10 cm proximal to ulnar styloid process 22.5  Just proximal to ulnar styloid process 15.8  Across hand at thumb web space 16.9  At base of 2nd digit 5.9  (Blank rows = not tested)  QUICK DASH SURVEY: 11/11/22: 36.36  12/03/22: 25%  TODAY'S TREATMENT:    12/29/22: Therapeutic Exercises Pulleys into flexion  x 2 mins and then abduction x 2 min Roll yellow ball up wall x 10 into flexion,  Red theraband row and extensions x 20 each, then standing with back against wall bil er with red theraband 2 x 10, tactile cues to keep elbows flexed at 90 degrees Supine on mat for Long arm c/cc 2# x 10 each direction and each arm (pt does tremor some with Lt but reports grip strength feels strong); then chest press with 3# on dowel x 20 Manual therapy P/ROM of Rt shoulder into flexion, abd and D2  to pts tolerance  MFR to Rt pectoralis  Messaged Margretta Ditty regarding Parkinson's options and discussed these with patient and gave her handout on local resources.    12/24/22: Therapeutic Exercises Pulleys into flexion x 2 mins and then abduction x 2 min Roll yellow ball up wall x 10 into flexion, then Lt abd x  returning therapist demo Red theraband row and extensions x 20 each, then standing with back against wall bil er with red theraband 2 x 10, tactile cues to keep elbows flexed at 90 degrees Supine on mat for Long arm c/cc 2# x 10 each direction and each arm (pt does tremor some with Lt but reports grip strength feels strong); then chest press with 3# on dowel x 20 Manual therapy P/ROM of Rt shoulder into flexion, abd and D2  to pts tolerance  MFR to Rt pectoralis tendon during end P/ROM STM to scar tissue in Rt axilla and at anterior shoulder; then cocoa butter at end of session at Rt chest wall where pt palpably very tight and skin dry, also cont STM to Rt pect tendon with cocoa butter a few more mins, pt still very tender  here  12/21/22: Therapeutic Exercises Pulleys into flexion x 2 mins and then abduction x 2 min Roll yellow ball up wall x 10 into flexion Red theraband row and extensions x 20 each, then seated bil er with red theraband 2 x 10 Supine on mat for Long arm c/cc 2# x 10 each direction and each arm (pt does tremor some with LT but reports grip strength feels strong); then chest press with 3# on dowel x 20 Manual therapy P/ROM of Rt shoulder into flexion, abd and D2  to pts tolerance  MFR to Rt pectoralis tendon during end P/ROM STM to scar tissue in Rt axilla and at anterior shoulder where pt would have occasional pain during certain positions on P/ROM  PATIENT EDUCATION:  Education details: Lymphedema risk reduction and post op shoulder/posture HEP Person educated: Patient Education method: Explanation, Demonstration, Handout Education comprehension: Patient verbalized understanding and returned demonstration  HOME EXERCISE PROGRAM: Patient was instructed today in a home exercise program today for post op shoulder range of motion. These included active assist shoulder flexion in sitting, scapular retraction, wall walking with shoulder abduction, and hands behind head external rotation. She was encouraged to do these twice a day, holding 3 seconds and repeating 5 times when permitted by her physician   Access Code: [email or web address] URL: [email or web address] Date: 11/19/2022 Prepared by: Gwenevere Abbot  Exercises - Seated Bilateral Shoulder External Rotation with Resistance  - 1 x daily - 3-4 x weekly - 1-3 sets - 10 reps - Standing Row with Anchored Resistance  - 1 x daily - 3-4 x weekly - 1-3 sets - 10 reps - Single Arm Shoulder Extension with Anchored Resistance  - 1 x daily - 3-4 x weekly - 1-3 sets - 10  reps Supine extension presses 5" x 6   CLINICAL IMPRESSION: Pt reports mild soreness at Rt superior chest after therapy but "not too much".  Pt continues with tenderness to Rt  lateral chest during manual therapy.   Pt will benefit from skilled therapeutic intervention to improve on the following deficits: Decreased knowledge of precautions, impaired UE functional use, pain, decreased ROM, postural dysfunction.   PT treatment/interventions: ADL/self-care home management, pt/family education, therapeutic exercise  REHAB POTENTIAL: Excellent  CLINICAL DECISION MAKING: Stable/uncomplicated  EVALUATION COMPLEXITY: Low   GOALS: Goals reviewed with patient? YES  LONG TERM GOALS: (STG=LTG)    Name Target Date Goal status  1 Pt will be able to verbalize understanding of pertinent lymphedema risk reduction practices relevant to her dx specifically related to skin care.  Baseline:  No knowledge 12/29/2022 Achieved at eval  2 Pt will be able to return demo and/or verbalize understanding of the post op HEP related to regaining shoulder ROM. Baseline:  No knowledge 12/29/2022 Achieved at eval  3 Pt will be able to verbalize understanding of the importance of attending the post op After Breast CA Class for further lymphedema risk reduction education and therapeutic exercise.  Baseline:  No knowledge 12/29/2022 Achieved at eval  4 Pt will demo she has regained full shoulder ROM and function post operatively compared to baselines.  Baseline: See objective measurements taken today. 12/26/2023 Ongoing   5  Pt will be improve functional strength of her right arm so that she able to put dishes on the second shelf of her cabinet without pain  12/26/2022 MET    PLAN:  PT FREQUENCY/DURATION: 2 times a week for 4 weeks   PLAN FOR NEXT SESSION: Cont Therapeutic exercise for ROM and strength of right shoulder, manual work PRN,  NO SOZO due to brain stimulator    Idamae Lusher, PT 12/29/2022, 10:56 AM  [facility] [address] [place], [place], [address] Phone: [phone]   Fax:  [phone]

## 2022-12-31 ENCOUNTER — Ambulatory Visit: Payer: Medicare PPO | Admitting: Rehabilitation

## 2023-01-05 ENCOUNTER — Ambulatory Visit: Payer: Medicare PPO

## 2023-01-05 DIAGNOSIS — M25611 Stiffness of right shoulder, not elsewhere classified: Secondary | ICD-10-CM

## 2023-01-05 DIAGNOSIS — C50211 Malignant neoplasm of upper-inner quadrant of right female breast: Secondary | ICD-10-CM | POA: Diagnosis not present

## 2023-01-05 DIAGNOSIS — R293 Abnormal posture: Secondary | ICD-10-CM

## 2023-01-05 DIAGNOSIS — Z170 Estrogen receptor positive status [ER+]: Secondary | ICD-10-CM | POA: Diagnosis not present

## 2023-01-05 DIAGNOSIS — Z483 Aftercare following surgery for neoplasm: Secondary | ICD-10-CM

## 2023-01-05 NOTE — Therapy (Signed)
OUTPATIENT PHYSICAL THERAPY BREAST CANCER TREATMENT   Patient Name: Jodi Medina MRN: [id number] DOB:06/18/1940, 82 y.o., female Today's Date: 01/05/2023  END OF SESSION:  PT End of Session - 01/05/23 1004     Visit Number 15    Date for PT Re-Evaluation 01/07/23    Authorization - Visit Number 6    Authorization - Number of Visits 8    PT Start Time 1005    PT Stop Time 1054    PT Time Calculation (min) 49 min    Activity Tolerance Patient tolerated treatment well    Behavior During Therapy WFL for tasks assessed/performed                 Past Medical History:  Diagnosis Date   Arthritis    Breast cancer (HCC)    Diverticulitis    High cholesterol    High triglycerides    Lactose intolerance    Skin cancer    Torn meniscus    bilateral   Tremor    Past Surgical History:  Procedure Laterality Date   BREAST BIOPSY Right 09/22/2022   [place] RT BREAST BX W LOC DEV 1ST LESION IMG BX SPEC US GUIDE 09/22/2022 [facility]   BREAST BIOPSY Right 09/22/2022   [place] RT BREAST BX W LOC DEV EA ADD LESION IMG BX SPEC US GUIDE 09/22/2022 [facility]   BREAST EXCISIONAL BIOPSY Left 1990   CATARACT EXTRACTION Right 05/2022   CATARACT EXTRACTION Left 08/2022   COLONOSCOPY  2019   DEEP BRAIN STIMULATOR PLACEMENT  01/2021   DEEP BRAIN STIMULATOR PLACEMENT  02/2021   DENTAL SURGERY  2017   DILATION AND CURETTAGE OF UTERUS  1990   INJECTION KNEE Right 2020   "gel"   MASTECTOMY W/ SENTINEL NODE BIOPSY Right 10/13/2022   Procedure: RIGHT MASTECTOMY WITH AXILLARY SENTINEL LYMPH NODE BIOPSY;  Surgeon: [name], MD;  Location: [facility] OR;  Service: General;  Laterality: Right;   microscopic knee surgery Left 12/2017   for torn meniscus   MULTIPLE TOOTH EXTRACTIONS  07/2022   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 [name]  REFERRING DIAG: Right breast cancer  THERAPY DIAG:  Stiffness of right shoulder, not elsewhere classified  Malignant neoplasm of upper-inner quadrant of right breast in female, estrogen receptor positive (HCC)  Abnormal posture  Aftercare following surgery for neoplasm  Rationale for Evaluation and Treatment: Rehabilitation  ONSET DATE: 09/18/2022  SUBJECTIVE:  SUBJECTIVE STATEMENT:  I think ROM is doing well. 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 It is ER positive in 1 mass and ER negative in another mass. Both areas are PR positive and HER2 negative with a Ki67 of 5-20%. She has Parkinson's and a deep brain stimulator for reducing tremors. She also has a hx of cerebellar CVA. On 5/ 21/2014 she had right breast mastectomy with removal of  2 lymph nodes negative for metastatic carcinoma. She will not be proceeding with adjuvant radiaiton   PATIENT GOALS:   reduce lymphedema risk and learn post op HEP.    PAIN:  Are you having pain? No  PRECAUTIONS: at risk for lymphedema   HAND DOMINANCE: right  WEIGHT BEARING RESTRICTIONS: No  FALLS:  Has patient fallen in last 6 months? No  LIVING ENVIRONMENT: Patient lives with: her husband Lives in: House/apartment Has following equipment at home: None  OCCUPATION: Retired  LEISURE: She walks twice a day to take her dog for walks  PRIOR LEVEL OF FUNCTION: Independent   OBJECTIVE: OBSERVATION :  pt with well approximated healing incsion across right chest.  She has an area of fullness at lateral aspect with incision deep inside a skin fold.  There is no drainage or odor. Pt instructed to adjust compression bra so that is covers and does not "dig in" to this area and limit the decongestion of the skin fold.       COGNITION: Overall cognitive status: Within functional limits for tasks assessed    POSTURE:  Forward head and rounded shoulders posture  UPPER EXTREMITY AROM/PROM:  A/PROM RIGHT   eval  Right  11/06/2022 11/19/22 12/01/22 12/07/22 12/16/22  Shoulder extension 46   40    Shoulder flexion 135 135  140 140 140  Shoulder abduction 165 90 140 140 - tight back of the shoulder  160 156  Shoulder internal rotation 71 60      Shoulder external rotation 79 90        (Blank rows = not tested)  A/PROM LEFT   eval  Shoulder extension 36  Shoulder flexion 143  Shoulder abduction 170  Shoulder internal rotation 75  Shoulder external rotation 85    (Blank rows = not tested)  LYMPHEDEMA ASSESSMENTS:   LANDMARK RIGHT   eval Right 11/06/2022  10 cm proximal to olecranon process 27 27  Olecranon process 24.6 24.5  10 cm proximal to ulnar styloid process 22.7 23  Just proximal to ulnar styloid process 16.2 16.3  Across hand at thumb web space 17.3 17.5  At base of 2nd digit 5.9 5.9  (Blank rows = not tested)  LANDMARK LEFT   eval  10 cm proximal to olecranon process 26.5  Olecranon process 24.9  10 cm proximal to ulnar  styloid process 22.5  Just proximal to ulnar styloid process 15.8  Across hand at thumb web space 16.9  At base of 2nd digit 5.9  (Blank rows = not tested)  QUICK DASH SURVEY: 11/11/22: 36.36  12/03/22: 25%  TODAY'S TREATMENT:     01/05/2023 Therapeutic Exercises Pulleys into flexion x 2 mins and  then abduction x 2 min RED band scapular retraction and extension 2 x 10 ea Elbow Flexion x 10 B with red,Supine horizontal abd yellow x 10 Supine alphabet 2# A-Z, chest press 2# x 10 B Manual therapy P/ROM of Rt shoulder into flexion, abd and D2 , IR and ER to pts tolerance  MFR to Rt pectoralis  12/29/22: Therapeutic Exercises Pulleys into flexion x 2 mins and then abduction x 2 min Roll yellow ball up wall x 10 into flexion,  Red theraband row and extensions x 20 each, then standing with back against wall bil er with red theraband 2 x 10, tactile cues to keep elbows flexed at 90 degrees Supine on mat for Long arm c/cc 2# x 10 each direction and each arm (pt does tremor some with Lt but reports grip strength feels strong); then chest press with 3# on dowel x 20 Manual therapy P/ROM of Rt shoulder into flexion, abd and D2  to pts tolerance  MFR to Rt pectoralis  Messaged Margretta Ditty regarding Parkinson's options and discussed these with patient and gave her handout on local resources.    12/24/22: Therapeutic Exercises Pulleys into flexion x 2 mins and then abduction x 2 min Roll yellow ball up wall x 10 into flexion, then Lt abd x  returning therapist demo Red theraband row and extensions x 20 each, then standing with back against wall bil er with red theraband 2 x 10, tactile cues to keep elbows flexed at 90 degrees Supine on mat for Long arm c/cc 2# x 10 each direction and each arm (pt does tremor some with Lt but reports grip strength feels strong); then chest press with 3# on dowel x 20 Manual therapy P/ROM of Rt shoulder into flexion, abd and D2  to pts tolerance  MFR to Rt  pectoralis tendon during end P/ROM STM to scar tissue in Rt axilla and at anterior shoulder; then cocoa butter at end of session at Rt chest wall where pt palpably very tight and skin dry, also cont STM to Rt pect tendon with cocoa butter a few more mins, pt still very tender here  12/21/22: Therapeutic Exercises Pulleys into flexion x 2 mins and then abduction x 2 min Roll yellow ball up wall x 10 into flexion Red theraband row and extensions x 20 each, then seated bil er with red theraband 2 x 10 Supine on mat for Long arm c/cc 2# x 10 each direction and each arm (pt does tremor some with LT but reports grip strength feels strong); then chest press with 3# on dowel x 20 Manual therapy P/ROM of Rt shoulder into flexion, abd and D2  to pts tolerance  MFR to Rt pectoralis tendon during end P/ROM STM to scar tissue in Rt axilla and at anterior shoulder where pt would have occasional pain during certain positions on P/ROM  PATIENT EDUCATION:  Education details: Lymphedema risk reduction and post op shoulder/posture HEP Person educated: Patient Education method: Explanation, Demonstration, Handout Education comprehension: Patient verbalized understanding and returned demonstration  HOME EXERCISE PROGRAM: Patient was instructed today in a home exercise program today for post op shoulder range of motion. These included active assist shoulder flexion in sitting, scapular retraction, wall walking with shoulder abduction, and hands behind head external rotation. She was encouraged to do these twice a day, holding 3 seconds and repeating 5 times when permitted by her physician   Access Code: [email or web address] URL: [email or web address] Date: 11/19/2022 Prepared by: Gwenevere Abbot  Exercises - Seated Bilateral Shoulder External Rotation with Resistance  - 1 x daily - 3-4 x weekly - 1-3 sets - 10 reps - Standing Row with Anchored Resistance  - 1 x daily - 3-4 x weekly - 1-3 sets - 10 reps - Single  Arm Shoulder Extension with Anchored Resistance  - 1 x daily - 3-4 x weekly - 1-3 sets - 10 reps Supine extension presses 5" x 6   CLINICAL IMPRESSION: Pt is making very good progress with shoulder ROM and strengthening. Chest discomfort is intermittent only. She is compliant with HEP. Pt will benefit from skilled therapeutic intervention to improve on the following deficits: Decreased knowledge of precautions, impaired UE functional use, pain, decreased ROM, postural dysfunction.   PT treatment/interventions: ADL/self-care home management, pt/family education, therapeutic exercise  REHAB POTENTIAL: Excellent  CLINICAL DECISION MAKING: Stable/uncomplicated  EVALUATION COMPLEXITY: Low   GOALS: Goals reviewed with patient? YES  LONG TERM GOALS: (STG=LTG)    Name Target Date Goal status  1 Pt will be able to verbalize understanding of pertinent lymphedema risk reduction practices relevant to her dx specifically related to skin care.  Baseline:  No knowledge 01/05/2023 Achieved at eval  2 Pt will be able to return demo and/or verbalize understanding of the post op HEP related to regaining shoulder ROM. Baseline:  No knowledge 01/05/2023 Achieved at eval  3 Pt will be able to verbalize understanding of the importance of attending the post op After Breast CA Class for further lymphedema risk reduction education and therapeutic exercise.  Baseline:  No knowledge 01/05/2023 Achieved at eval  4 Pt will demo she has regained full shoulder ROM and function post operatively compared to baselines.  Baseline: See objective measurements taken today. 12/26/2023 Ongoing   5  Pt will be improve functional strength of her right arm so that she able to put dishes on the second shelf of her cabinet without pain  12/26/2022 MET    PLAN:  PT FREQUENCY/DURATION: 2 times a week for 4 weeks   PLAN FOR NEXT SESSION: check ROM,goals,Cont Therapeutic exercise for ROM and strength of right shoulder, manual work PRN,   NO SOZO due to brain stimulator    Waynette Buttery, PT 01/05/2023, 10:57 AM  [facility] [address] [place], [place], [address] Phone: [phone]   Fax:  [phone]

## 2023-01-06 DIAGNOSIS — E785 Hyperlipidemia, unspecified: Secondary | ICD-10-CM | POA: Diagnosis not present

## 2023-01-07 ENCOUNTER — Ambulatory Visit: Payer: Medicare PPO

## 2023-01-07 DIAGNOSIS — R293 Abnormal posture: Secondary | ICD-10-CM | POA: Diagnosis not present

## 2023-01-07 DIAGNOSIS — M25611 Stiffness of right shoulder, not elsewhere classified: Secondary | ICD-10-CM | POA: Diagnosis not present

## 2023-01-07 DIAGNOSIS — Z483 Aftercare following surgery for neoplasm: Secondary | ICD-10-CM

## 2023-01-07 DIAGNOSIS — Z170 Estrogen receptor positive status [ER+]: Secondary | ICD-10-CM | POA: Diagnosis not present

## 2023-01-07 DIAGNOSIS — C50211 Malignant neoplasm of upper-inner quadrant of right female breast: Secondary | ICD-10-CM | POA: Diagnosis not present

## 2023-01-07 NOTE — Therapy (Addendum)
OUTPATIENT PHYSICAL THERAPY BREAST CANCER TREATMENT   Patient Name: Jodi Medina MRN: [id number] DOB:November 07, 1940, 82 y.o., female Today's Date: 01/07/2023  END OF SESSION:  PT End of Session - 01/07/23 0959     Visit Number 16    Number of Visits 16    Date for PT Re-Evaluation 01/07/23    Authorization Type 8 visits 12/07/22-01/07/23    Authorization - Visit Number 7    Authorization - Number of Visits 8    PT Start Time 1000    PT Stop Time 1101    PT Time Calculation (min) 61 min    Activity Tolerance Patient tolerated treatment well    Behavior During Therapy WFL for tasks assessed/performed                 Past Medical History:  Diagnosis Date   Arthritis    Breast cancer (HCC)    Diverticulitis    High cholesterol    High triglycerides    Lactose intolerance    Skin cancer    Torn meniscus    bilateral   Tremor    Past Surgical History:  Procedure Laterality Date   BREAST BIOPSY Right 09/22/2022   [place] RT BREAST BX W LOC DEV 1ST LESION IMG BX SPEC US GUIDE 09/22/2022 [facility]   BREAST BIOPSY Right 09/22/2022   [place] RT BREAST BX W LOC DEV EA ADD LESION IMG BX SPEC US GUIDE 09/22/2022 [facility]   BREAST EXCISIONAL BIOPSY Left 1990   CATARACT EXTRACTION Right 05/2022   CATARACT EXTRACTION Left 08/2022   COLONOSCOPY  2019   DEEP BRAIN STIMULATOR PLACEMENT  01/2021   DEEP BRAIN STIMULATOR PLACEMENT  02/2021   DENTAL SURGERY  2017   DILATION AND CURETTAGE OF UTERUS  1990   INJECTION KNEE Right 2020   "gel"   MASTECTOMY W/ SENTINEL NODE BIOPSY Right 10/13/2022   Procedure: RIGHT MASTECTOMY WITH AXILLARY SENTINEL LYMPH NODE BIOPSY;  Surgeon: [name], MD;  Location: [facility] OR;  Service: General;  Laterality: Right;   microscopic knee surgery Left 12/2017   for torn meniscus   MULTIPLE TOOTH EXTRACTIONS  07/2022   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 [name]  REFERRING DIAG: Right breast cancer  THERAPY DIAG:  Stiffness of right shoulder, not elsewhere classified  Malignant neoplasm of upper-inner quadrant of right breast in female, estrogen receptor positive (HCC)  Abnormal posture  Aftercare following surgery for neoplasm  Rationale for Evaluation and Treatment: Rehabilitation  ONSET DATE: 09/18/2022  SUBJECTIVE:  SUBJECTIVE STATEMENT:  I'm doing great. I feel ready to D/C.   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 It is ER positive in 1 mass and ER negative in another mass. Both areas are PR positive and HER2 negative with a Ki67 of 5-20%. She has Parkinson's and a deep brain stimulator for reducing tremors. She also has a hx of cerebellar CVA. On 5/ 21/2014 she had right breast mastectomy with removal of  2 lymph nodes negative for metastatic carcinoma. She will not be proceeding with adjuvant radiaiton   PATIENT GOALS:   reduce  lymphedema risk and learn post op HEP.   PAIN:  Are you having pain? No  PRECAUTIONS: at risk for lymphedema   HAND DOMINANCE: right  WEIGHT BEARING RESTRICTIONS: No  FALLS:  Has patient fallen in last 6 months? No  LIVING ENVIRONMENT: Patient lives with: her husband Lives in: House/apartment Has following equipment at home: None  OCCUPATION: Retired  LEISURE: She walks twice a day to take her dog for walks  PRIOR LEVEL OF FUNCTION: Independent   OBJECTIVE: OBSERVATION :  pt with well approximated healing incsion across right chest.  She has an area of fullness at lateral aspect with incision deep inside a skin fold.  There is no drainage or odor. Pt instructed to adjust compression bra so that is covers and does not "dig in" to this area and limit the decongestion of the skin fold.       COGNITION: Overall cognitive status: Within functional limits for tasks assessed    POSTURE:  Forward head and rounded shoulders posture  UPPER EXTREMITY AROM/PROM:  A/PROM RIGHT   eval  Right  11/06/2022 11/19/22 12/01/22 12/07/22 12/16/22 01/07/23  Shoulder extension 46   40     Shoulder flexion 135 135  140 140 140 158  Shoulder abduction 165 90 140 140 - tight back of the shoulder  160 156 165  Shoulder internal rotation 71 60       Shoulder external rotation 79 90         (Blank rows = not tested)  A/PROM LEFT   eval  Shoulder extension 36  Shoulder flexion 143  Shoulder abduction 170  Shoulder internal rotation 75  Shoulder external rotation 85    (Blank rows = not tested)  LYMPHEDEMA ASSESSMENTS:   LANDMARK RIGHT   eval Right 11/06/2022  10 cm proximal to olecranon process 27 27  Olecranon process 24.6 24.5  10 cm proximal to ulnar styloid process 22.7 23  Just proximal to ulnar styloid process 16.2 16.3  Across hand at thumb web space 17.3 17.5  At base of 2nd digit 5.9 5.9  (Blank rows = not tested)  LANDMARK LEFT   eval  10 cm proximal to olecranon  process 26.5  Olecranon process 24.9  10 cm proximal to ulnar styloid process 22.5  Just proximal to ulnar styloid process 15.8  Across hand at thumb web space 16.9  At base of 2nd digit 5.9  (Blank rows = not tested)  QUICK DASH SURVEY: 11/11/22: 36.36  12/03/22: 25%  TODAY'S TREATMENT:     01/07/23: Therapeutic Exercises Pulleys into flexion x 2  mins and then abduction x 2 min RED band scapular retraction and extension x 20 ea Wall Push Ups x 10 returning therapist demo Elbow Flexion x 10 B with red, Seated horizontal abd yellow x 10 Supine: Rt UE alphabet 2# A-Z, Bil chest press 2# 2 x 10 Manual therapy P/ROM of Rt shoulder into flexion, abd and D2 to pts tolerance  MFR to Rt pectoralis   01/05/2023 Therapeutic Exercises Pulleys into flexion x 2 mins and then abduction x 2 min RED band scapular retraction and extension 2 x 10 ea Elbow Flexion x 10 B with red,Supine horizontal abd yellow x 10 Supine alphabet 2# A-Z, chest press 2# x 10 B Manual therapy P/ROM of Rt shoulder into flexion, abd and D2 , IR and ER to pts tolerance  MFR to Rt pectoralis  12/29/22: Therapeutic Exercises Pulleys into flexion x 2 mins and then abduction x 2 min Roll yellow ball up wall x 10 into flexion,  Red theraband row and extensions x 20 each, then standing with back against wall bil er with red theraband 2 x 10, tactile cues to keep elbows flexed at 90 degrees Supine on mat for Long arm c/cc 2# x 10 each direction and each arm (pt does tremor some with Lt but reports grip strength feels strong); then chest press with 3# on dowel x 20 Manual therapy P/ROM of Rt shoulder into flexion, abd and D2  to pts tolerance  MFR to Rt pectoralis  Messaged Margretta Ditty regarding Parkinson's options and discussed these with patient and gave her handout on local resources.    12/24/22: Therapeutic Exercises Pulleys into flexion x 2 mins and then abduction x 2 min Roll yellow ball up wall x 10 into flexion,  then Lt abd x  returning therapist demo Red theraband row and extensions x 20 each, then standing with back against wall bil er with red theraband 2 x 10, tactile cues to keep elbows flexed at 90 degrees Supine on mat for Long arm c/cc 2# x 10 each direction and each arm (pt does tremor some with Lt but reports grip strength feels strong); then chest press with 3# on dowel x 20 Manual therapy P/ROM of Rt shoulder into flexion, abd and D2  to pts tolerance  MFR to Rt pectoralis tendon during end P/ROM STM to scar tissue in Rt axilla and at anterior shoulder; then cocoa butter at end of session at Rt chest wall where pt palpably very tight and skin dry, also cont STM to Rt pect tendon with cocoa butter a few more mins, pt still very tender here  12/21/22: Therapeutic Exercises Pulleys into flexion x 2 mins and then abduction x 2 min Roll yellow ball up wall x 10 into flexion Red theraband row and extensions x 20 each, then seated bil er with red theraband 2 x 10 Supine on mat for Long arm c/cc 2# x 10 each direction and each arm (pt does tremor some with LT but reports grip strength feels strong); then chest press with 3# on dowel x 20 Manual therapy P/ROM of Rt shoulder into flexion, abd and D2  to pts tolerance  MFR to Rt pectoralis tendon during end P/ROM STM to scar tissue in Rt axilla and at anterior shoulder where pt would have occasional pain during certain positions on P/ROM  PATIENT EDUCATION:  Education details: Lymphedema risk reduction and post op shoulder/posture HEP Person educated: Patient Education method: Explanation, Demonstration, Handout Education comprehension: Patient verbalized  understanding and returned demonstration  HOME EXERCISE PROGRAM: Patient was instructed today in a home exercise program today for post op shoulder range of motion. These included active assist shoulder flexion in sitting, scapular retraction, wall walking with shoulder abduction, and hands behind  head external rotation. She was encouraged to do these twice a day, holding 3 seconds and repeating 5 times when permitted by her physician   Access Code: [email or web address] URL: [email or web address] Date: 11/19/2022 Prepared by: Gwenevere Abbot  Exercises - Seated Bilateral Shoulder External Rotation with Resistance  - 1 x daily - 3-4 x weekly - 1-3 sets - 10 reps - Standing Row with Anchored Resistance  - 1 x daily - 3-4 x weekly - 1-3 sets - 10 reps - Single Arm Shoulder Extension with Anchored Resistance  - 1 x daily - 3-4 x weekly - 1-3 sets - 10 reps Supine extension presses 5" x 6   CLINICAL IMPRESSION: Pt has made excellent progress with physical therapy. She has met her goals and is ready for D/C at this time. We will not be following her with SOZO screens due to her having a brain stimulator. Pt is agreeable to D/C at this time.   Pt will benefit from skilled therapeutic intervention to improve on the following deficits: Decreased knowledge of precautions, impaired UE functional use, pain, decreased ROM, postural dysfunction.   PT treatment/interventions: ADL/self-care home management, pt/family education, therapeutic exercise  REHAB POTENTIAL: Excellent  CLINICAL DECISION MAKING: Stable/uncomplicated  EVALUATION COMPLEXITY: Low   GOALS: Goals reviewed with patient? YES  LONG TERM GOALS: (STG=LTG)    Name Target Date Goal status  1 Pt will be able to verbalize understanding of pertinent lymphedema risk reduction practices relevant to her dx specifically related to skin care.  Baseline:  No knowledge 01/07/2023 Achieved at eval  2 Pt will be able to return demo and/or verbalize understanding of the post op HEP related to regaining shoulder ROM. Baseline:  No knowledge 01/07/2023 Achieved at eval  3 Pt will be able to verbalize understanding of the importance of attending the post op After Breast CA Class for further lymphedema risk reduction education and therapeutic  exercise.  Baseline:  No knowledge 01/07/2023 Achieved at eval  4 Pt will demo she has regained full shoulder ROM and function post operatively compared to baselines.  Baseline: See objective measurements taken today. 12/26/2023 MET  5  Pt will be improve functional strength of her right arm so that she able to put dishes on the second shelf of her cabinet without pain  12/26/2022 MET    PLAN:  PT FREQUENCY/DURATION: 2 times a week for 4 weeks   PLAN FOR NEXT SESSION: D/C this session; NO SOZO due to brain stimulator    Hermenia Bers, PTA 01/07/2023, 11:04 AM  [facility] [address] [place], [place], [address] Phone: [phone]   Fax:  [phone]  PHYSICAL THERAPY DISCHARGE SUMMARY  Visits from Start of Care: 16  Current functional level related to goals / functional outcomes: See above   Remaining deficits: Lymphedema risk   Education / Equipment: Final HEP  Plan: Patient agrees to discharge.  Patient is being discharged due to meeting the stated rehab goals.

## 2023-02-01 ENCOUNTER — Telehealth: Payer: Self-pay

## 2023-02-01 ENCOUNTER — Ambulatory Visit: Payer: Medicare PPO

## 2023-02-01 NOTE — Telephone Encounter (Signed)
Pt LVM regarding questions about her Anastrozole therapy. This RN returned phone call and patient states concerns of memory loss. Pt states that she felt she didn't have memory loss prior to starting the Anastrozole therapy, but she did start to have "trouble finding words." She states that she feels she's developing more memory problems and having increasing trouble formulating sentences/words over the last few months since starting the Anastrozole therapy. Pt states that she feels it does not interfere with everyday life, but there is a concern for her because her father had Alzheimer's disease.   Pt wanted to see if there was a an alternative route or medication rather than Anastrozole due to these concerns. This RN stated that she has an appointment with Lillard Anes, NP this Friday 02/05/23 and that it would be a good time to bring up her concerns and possible hormonal therapy options with her then. Pt verbalized understanding and this RN informed patient that she can call back on the same number if she has any further concerns or questions prior to her appointment on Friday. Pt verbalized understanding.

## 2023-02-04 ENCOUNTER — Telehealth: Payer: Self-pay | Admitting: Adult Health

## 2023-02-04 NOTE — Telephone Encounter (Signed)
Rescheduled appointment per provider PAL. Patient is aware of the changes made to her upcoming appointment. 

## 2023-02-05 ENCOUNTER — Inpatient Hospital Stay: Payer: Medicare PPO | Admitting: Adult Health

## 2023-02-09 ENCOUNTER — Inpatient Hospital Stay: Payer: Medicare PPO | Attending: Hematology and Oncology | Admitting: Adult Health

## 2023-02-09 ENCOUNTER — Encounter: Payer: Self-pay | Admitting: Adult Health

## 2023-02-09 VITALS — BP 114/61 | HR 62 | Temp 97.3°F | Resp 18 | Wt 150.3 lb

## 2023-02-09 DIAGNOSIS — Z818 Family history of other mental and behavioral disorders: Secondary | ICD-10-CM | POA: Insufficient documentation

## 2023-02-09 DIAGNOSIS — M2550 Pain in unspecified joint: Secondary | ICD-10-CM | POA: Insufficient documentation

## 2023-02-09 DIAGNOSIS — Z8249 Family history of ischemic heart disease and other diseases of the circulatory system: Secondary | ICD-10-CM | POA: Insufficient documentation

## 2023-02-09 DIAGNOSIS — Z8262 Family history of osteoporosis: Secondary | ICD-10-CM | POA: Diagnosis not present

## 2023-02-09 DIAGNOSIS — Z79899 Other long term (current) drug therapy: Secondary | ICD-10-CM | POA: Insufficient documentation

## 2023-02-09 DIAGNOSIS — C50811 Malignant neoplasm of overlapping sites of right female breast: Secondary | ICD-10-CM

## 2023-02-09 DIAGNOSIS — Z800 Family history of malignant neoplasm of digestive organs: Secondary | ICD-10-CM | POA: Insufficient documentation

## 2023-02-09 DIAGNOSIS — Z83438 Family history of other disorder of lipoprotein metabolism and other lipidemia: Secondary | ICD-10-CM | POA: Insufficient documentation

## 2023-02-09 DIAGNOSIS — Z170 Estrogen receptor positive status [ER+]: Secondary | ICD-10-CM

## 2023-02-09 DIAGNOSIS — Z79811 Long term (current) use of aromatase inhibitors: Secondary | ICD-10-CM | POA: Diagnosis not present

## 2023-02-09 DIAGNOSIS — Z803 Family history of malignant neoplasm of breast: Secondary | ICD-10-CM | POA: Diagnosis not present

## 2023-02-09 DIAGNOSIS — Z823 Family history of stroke: Secondary | ICD-10-CM | POA: Diagnosis not present

## 2023-02-09 DIAGNOSIS — Z85828 Personal history of other malignant neoplasm of skin: Secondary | ICD-10-CM | POA: Diagnosis not present

## 2023-02-09 DIAGNOSIS — Z9011 Acquired absence of right breast and nipple: Secondary | ICD-10-CM | POA: Diagnosis not present

## 2023-02-09 NOTE — Progress Notes (Signed)
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  This showed highly suspicious right breast mass at 12 o'clock position 3 cm from the nipple and 1 o'clock position 3 cm from the nipple.  Recommend ultrasound-guided biopsy.  There is a questionable third focal mass at 11:00 3 cm from the nipple.  Additional multicystic mass extending to the nipple at 11 o'clock position 1 cm from the nipple which may represent a focally dilated duct.  No suspicious right axillary lymphadenopathy.  No mammographic evidence of malignancy on the left.   09/22/2022 Pathology Results   Right breast needle core biopsy showed invasive mammary carcinoma, identified as lobular based on loss of E-cadherin, the 1:00 mass 3 cm from the nipple is grade 2 ER/PR positive HER2 negative Ki-67 of 20%.  The right breast needle core biopsy at 12:00 3 cm from the nipple also identified as invasive lobular, high-grade ER positive PR negative HER2 negative with Ki-67 of 5%   09/28/2022 Cancer Staging   Staging form: Breast, AJCC 8th Edition - Clinical stage from 09/28/2022: Stage IIA (cT2, cN0, cM0, G3, ER+, PR+, HER2-) - Signed by Ronny Bacon, PA-C on 09/28/2022 Stage prefix: Initial diagnosis Method of lymph node assessment: Clinical Histologic grading system: 3 grade system   10/13/2022 Surgery   Right breast mastectomy: ILC, 4.9 cm, grade 2, margins negative, 2 SLN negative, ER+, PR-, HER2 -, Ki67 5%,    10/13/2022 Cancer Staging   Staging form: Breast, AJCC 8th Edition - Pathologic stage from 10/13/2022: Stage IIA (pT2, pN0, cM0, G2, ER+, PR-, HER2-) - Signed by Loa Socks, NP on 02/09/2023 Histologic grading system: 3 grade system   10/2022 -  Anti-estrogen oral therapy   Anastrozole      INTERVAL HISTORY:  Ms. Jodi Medina to review her survivorship care plan detailing her treatment course for breast cancer, as well as monitoring long-term side effects of that treatment, education regarding health maintenance, screening, and overall wellness and health promotion.     Overall, Ms. Jodi Medina reports feeling quite well.  She is taking anastrozole daily and her main issue with taking this is arthralgias however these are mild.  She also notes some mild hair thinning.  She tells me her cholesterol medicine recently had to be increased and she had it checked in July or August.  Of note she started the anastrozole in June.  REVIEW OF SYSTEMS:  Review of Systems  Constitutional:  Negative for appetite change, chills, fatigue, fever and unexpected weight change.  HENT:   Negative for hearing loss, lump/mass and trouble swallowing.   Eyes:  Negative for eye problems and icterus.  Respiratory:  Negative for chest tightness, cough and shortness of breath.   Cardiovascular:  Negative for chest pain, leg swelling and palpitations.  Gastrointestinal:  Negative for abdominal distention, abdominal pain, constipation, diarrhea, nausea and vomiting.  Endocrine: Negative for hot flashes.  Genitourinary:  Negative for difficulty urinating.   Musculoskeletal:  Positive for arthralgias.  Skin:  Negative for itching and rash.  Neurological:  Negative for dizziness, extremity weakness, headaches and numbness.  Hematological:  Negative for adenopathy. Does not bruise/bleed easily.  Psychiatric/Behavioral:  Negative for depression. The patient is not nervous/anxious.   Breast: Denies any new nodularity, masses, tenderness, nipple changes, or nipple discharge.  PAST MEDICAL/SURGICAL HISTORY:  Past Medical History:  Diagnosis Date   Arthritis    Breast cancer (HCC)    Diverticulitis    High cholesterol    High triglycerides    Lactose intolerance    Skin cancer    Torn meniscus    bilateral    Tremor    Past Surgical History:  Procedure Laterality Date   BREAST BIOPSY Right 09/22/2022   [place] RT BREAST BX W LOC DEV 1ST LESION IMG BX SPEC US GUIDE 09/22/2022 [facility]   BREAST BIOPSY Right 09/22/2022   [place] RT BREAST BX W LOC DEV EA ADD LESION IMG BX SPEC US GUIDE 09/22/2022 [facility]   BREAST EXCISIONAL BIOPSY Left 1990   CATARACT EXTRACTION Right 05/2022   CATARACT EXTRACTION Left 08/2022   COLONOSCOPY  2019   DEEP BRAIN STIMULATOR PLACEMENT  01/2021   DEEP BRAIN STIMULATOR PLACEMENT  02/2021   DENTAL SURGERY  2017   DILATION AND CURETTAGE OF UTERUS  1990   INJECTION KNEE Right 2020   "gel"   MASTECTOMY W/ SENTINEL NODE BIOPSY Right 10/13/2022   Procedure: RIGHT MASTECTOMY WITH AXILLARY SENTINEL LYMPH NODE BIOPSY;  Surgeon: [name], MD;  Location: [facility] OR;  Service: General;  Laterality: Right;   microscopic knee surgery Left 12/2017   for torn meniscus   MULTIPLE TOOTH EXTRACTIONS  07/2022   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 Take with a full glass of water on an empty stomach.   anastrozole (ARIMIDEX) 1 MG tablet Take 1 tablet (1 mg total) by mouth daily.   aspirin EC 81 MG tablet Take 81 mg by mouth daily. Swallow whole.   atorvastatin (LIPITOR) 20 MG tablet    CALCIUM CITRATE-VITAMIN D3 PO Take 200 mg by mouth daily.   carbidopa-levodopa (SINEMET IR) 25-100 MG tablet Take 1 tablet by mouth 3 (three) times daily.   carboxymethylcellulose 1 % ophthalmic solution Place 1 drop into both eyes 2 (two) times daily as needed (Dry eyes). Refresh   cholecalciferol (VITAMIN D3) 25 MCG (1000 UNIT)  tablet Take 1,000 Units by mouth daily.   clobetasol (TEMOVATE) 0.05 % external solution Apply 1 Application topically daily as needed (itching scalp).   Coenzyme Q10 (CO Q 10) 100 MG CAPS Take 100 mg by mouth.   diclofenac Sodium (VOLTAREN) 1 % GEL Apply 4 g topically 4 (four) times daily. (Patient taking differently: Apply 4 g topically daily as needed (Arthritis).)   lactase (LACTAID) 3000 units tablet Take 3,000 Units by mouth daily as needed (lactose).   Probiotic Product (ALIGN) 4 MG CAPS Take 4 mg by mouth daily.   propranolol (INDERAL) 10 MG tablet TAKE 1 TABLET(10 MG) BY MOUTH THREE TIMES DAILY AS NEEDED (Patient taking differently: Take 10 mg by mouth 3 (three) times daily.)   vitamin C (ASCORBIC ACID) 250 MG tablet Take 250 mg by mouth daily.   Wheat Dextrin (BENEFIBER DRINK MIX PO) Take by mouth daily.   [DISCONTINUED] atorvastatin (LIPITOR) 10 MG tablet Take 10 mg by mouth daily.   [DISCONTINUED] methocarbamol (  ROBAXIN) 500 MG tablet Take 1 tablet (500 mg total) by mouth every 8 (eight) hours as needed for muscle spasms.   No facility-administered encounter medications on file as of 02/09/2023.     ONCOLOGIC FAMILY HISTORY:  Family History  Problem Relation Age of Onset   Hyperlipidemia Mother    Tremor Mother    Osteoporosis Mother    Dementia Mother    Heart failure Mother    Alzheimer's disease Father    Prostate cancer Brother        dx >30   Breast cancer Maternal Aunt        dx 21s   Heart disease Maternal Uncle    Heart disease Paternal Uncle    Stroke Maternal Grandmother    Melanoma Daughter        dx 32s; leg; s/p excision   Colon cancer Nephew 58       metastatic     SOCIAL HISTORY:  Social History   Socioeconomic History   Marital status: Married    Spouse name: Not on file   Number of children: 3   Years of education: Not on file   Highest education level: Master's degree (e.g., MA, MS, MEng, MEd, MSW, MBA)  Occupational History   Not on file   Tobacco Use   Smoking status: Never   Smokeless tobacco: Never  Vaping Use   Vaping status: Never Used  Substance and Sexual Activity   Alcohol use: Yes    Comment: socially   Drug use: No   Sexual activity: Not on file  Other Topics Concern   Not on file  Social History Narrative   Married, lives at home with husband   Daughter in [place]- 1 grandson   Daughter in [place]- no children   Son- IT works on Community education officer, travel in an RV, lives in [place] degree   Retired school Garment/textile technologist.      Right handed   Caffeine: 1 cup decaf coffee or herbal tea in the mornings; 2 cups daily in the winter of decaf.   Social Determinants of Health   Financial Resource Strain: Low Risk  (04/25/2022)   Received from [facility], [facility]   Overall Financial Resource Strain (CARDIA)    Difficulty of Paying Living Expenses: Not hard at all  Food Insecurity: No Food Insecurity (04/28/2022)   Received from [facility], [facility]   Hunger Vital Sign    Worried About Running Out of Food in the Last Year: Never true    Ran Out of Food in the Last Year: Never true  Transportation Needs: No Transportation Needs (04/22/2021)   Received from [facility], [facility]   PRAPARE - Transportation    Lack of Transportation (Medical): No    Lack of Transportation (Non-Medical): No  Physical Activity: Unknown (04/25/2022)   Received from [facility], [facility]   Exercise Vital Sign    Days of Exercise per Week: 2 days    Minutes of Exercise per Session: Not on file  Stress: No Stress Concern Present (04/22/2021)   Received from [facility], [facility] - Occupational Stress Questionnaire    Feeling of Stress : Not at all  Social Connections: Moderately Integrated (04/25/2022)   Received from [facility], [facility]   Social Network    How would you rate your social network (family, work, friends)?: Adequate  participation with social networks  Intimate Partner  Violence: Not At Risk (04/25/2022)   Received from [facility], [facility]   HITS    Over the last 12 months how often did your partner physically hurt you?: 1    Over the last 12 months how often did your partner insult you or talk down to you?: 1    Over the last 12 months how often did your partner threaten you with physical harm?: 1    Over the last 12 months how often did your partner scream or curse at you?: 1     OBSERVATIONS/OBJECTIVE:  BP 114/61 (BP Location: Left Arm, Patient Position: Sitting)   Pulse 62   Temp (!) 97.3 F (36.3 C) (Temporal)   Resp 18   Wt 150 lb 5 oz (68.2 kg)   SpO2 96%   BMI 26.63 kg/m  GENERAL: Patient is a well appearing female in no acute distress HEENT:  Sclerae anicteric.  Oropharynx clear and moist. No ulcerations or evidence of oropharyngeal candidiasis. Neck is supple.  NODES:  No cervical, supraclavicular, or axillary lymphadenopathy palpated.  BREAST EXAM: Right breast status postmastectomy, no sign of local recurrence left breast is benign. LUNGS:  Clear to auscultation bilaterally.  No wheezes or rhonchi. HEART:  Regular rate and rhythm. No murmur appreciated. ABDOMEN:  Soft, nontender.  Positive, normoactive bowel sounds. No organomegaly palpated. MSK:  No focal spinal tenderness to palpation. Full range of motion bilaterally in the upper extremities. EXTREMITIES:  No peripheral edema.   SKIN:  Clear with no obvious rashes or skin changes. No nail dyscrasia. NEURO:  Nonfocal. Well oriented.  Appropriate affect.   LABORATORY DATA:  None for this visit.  DIAGNOSTIC IMAGING:  None for this visit.      ASSESSMENT AND PLAN:  Ms.. Medina is a pleasant 82 y.o. female with Stage IIA right breast invasive ductal carcinoma, ER+/PR+/HER2-, diagnosed in 08/2022, treated with lumpectomy, adjuvant radiation therapy, and anti-estrogen therapy with Anastrozole beginning in 10/2022.  She  presents to the Survivorship Clinic for our initial meeting and routine follow-up post-completion of treatment for breast cancer.    1. Stage IIA right breast cancer:  Ms. Mapps is continuing to recover from definitive treatment for breast cancer. She will follow-up with her medical oncologist, Dr.  Al Pimple in 6 months with history and physical exam per surveillance protocol.  She will continue her anti-estrogen therapy with Anastrozole. Thus far, she is tolerating the Anastrozole well, with minimal side effects. Her mammogram is due 08/2022; orders placed today.   Today, a comprehensive survivorship care plan and treatment summary was reviewed with the patient today detailing her breast cancer diagnosis, treatment course, potential late/long-term effects of treatment, appropriate follow-up care with recommendations for the future, and patient education resources.  A copy of this summary, along with a letter will be sent to the patient's primary care provider via mail/fax/In Basket message after today's visit.    2.  Elevated cholesterol: This is likely not a result of the anastrozole, however when she has her cholesterol levels checked again we will follow-up on those results.  3. Bone health:  Given Ms. Dunwoody's age/history of breast cancer and her current treatment regimen including anti-estrogen therapy with Anastrozole, she is at risk for bone demineralization.   She is scheduled to undergo bone density testing in 06/2023.  Her most recent bone density in 01/2020 was consistent with osteopenia with a t score of -1.6 in the AP spine.  She was given education on specific activities to promote  bone health.  4. Cancer screening:  Due to Ms. Fredlund's history and her age, she should receive screening for skin cancers, colon cancer, and gynecologic cancers.  The information and recommendations are listed on the patient's comprehensive care plan/treatment summary and were reviewed in detail with the patient.     5. Health maintenance and wellness promotion: Ms. Fain was encouraged to consume 5-7 servings of fruits and vegetables per day. We reviewed the "Nutrition Rainbow" handout.  She was also encouraged to engage in moderate to vigorous exercise for 30 minutes per day most days of the week.  She was instructed to limit her alcohol consumption and continue to abstain from tobacco use.     6. Support services/counseling: It is not uncommon for this period of the patient's cancer care trajectory to be one of many emotions and stressors.   She was given information regarding our available services and encouraged to contact me with any questions or for help enrolling in any of our support group/programs.    Follow up instructions:    -Return to cancer center in 6 months for f/u with Dr. Al Pimple  -Mammogram due in 08/2023 -Bone density testing in 06/2023 -She is welcome to return back to the Survivorship Clinic at any time; no additional follow-up needed at this time.  -Consider referral back to survivorship as a long-term survivor for continued surveillance  The patient was provided an opportunity to ask questions and all were answered. The patient agreed with the plan and demonstrated an understanding of the instructions.   Total encounter time:30 minutes*in face-to-face visit time, chart review, lab review, care coordination, order entry, and documentation of the encounter time.    Lillard Anes, NP 02/09/23 10:14 AM Medical Oncology and H[facility] [address] [place], [place] [address] Tel. [phone]    Fax. [phone]  *Total Encounter Time as defined by the Centers for Medicare and Medicaid Services includes, in addition to the face-to-face time of a patient visit (documented in the note above) non-face-to-face time: obtaining and reviewing outside history, ordering and reviewing medications, tests or procedures, care coordination (communications with other  health care professionals or caregivers) and documentation in the medical record.

## 2023-02-11 ENCOUNTER — Telehealth: Payer: Self-pay | Admitting: Adult Health

## 2023-02-11 NOTE — Telephone Encounter (Signed)
Left patient a message regarding scheduled appointment times/dates

## 2023-03-26 DIAGNOSIS — G250 Essential tremor: Secondary | ICD-10-CM | POA: Diagnosis not present

## 2023-03-26 DIAGNOSIS — Z1330 Encounter for screening examination for mental health and behavioral disorders, unspecified: Secondary | ICD-10-CM | POA: Diagnosis not present

## 2023-03-26 DIAGNOSIS — Z8673 Personal history of transient ischemic attack (TIA), and cerebral infarction without residual deficits: Secondary | ICD-10-CM | POA: Diagnosis not present

## 2023-03-26 DIAGNOSIS — G20C Parkinsonism, unspecified: Secondary | ICD-10-CM | POA: Diagnosis not present

## 2023-04-02 DIAGNOSIS — H04123 Dry eye syndrome of bilateral lacrimal glands: Secondary | ICD-10-CM | POA: Diagnosis not present

## 2023-04-02 DIAGNOSIS — Z961 Presence of intraocular lens: Secondary | ICD-10-CM | POA: Diagnosis not present

## 2023-04-28 DIAGNOSIS — G20A1 Parkinson's disease without dyskinesia, without mention of fluctuations: Secondary | ICD-10-CM | POA: Diagnosis not present

## 2023-04-28 DIAGNOSIS — Z9689 Presence of other specified functional implants: Secondary | ICD-10-CM | POA: Diagnosis not present

## 2023-04-28 DIAGNOSIS — G250 Essential tremor: Secondary | ICD-10-CM | POA: Diagnosis not present

## 2023-04-28 DIAGNOSIS — Z79899 Other long term (current) drug therapy: Secondary | ICD-10-CM | POA: Diagnosis not present

## 2023-04-28 DIAGNOSIS — Z9682 Presence of neurostimulator: Secondary | ICD-10-CM | POA: Diagnosis not present

## 2023-05-05 DIAGNOSIS — Z8673 Personal history of transient ischemic attack (TIA), and cerebral infarction without residual deficits: Secondary | ICD-10-CM | POA: Diagnosis not present

## 2023-05-05 DIAGNOSIS — Z85828 Personal history of other malignant neoplasm of skin: Secondary | ICD-10-CM | POA: Diagnosis not present

## 2023-05-05 DIAGNOSIS — Z23 Encounter for immunization: Secondary | ICD-10-CM | POA: Diagnosis not present

## 2023-05-05 DIAGNOSIS — Z1331 Encounter for screening for depression: Secondary | ICD-10-CM | POA: Diagnosis not present

## 2023-05-05 DIAGNOSIS — G20C Parkinsonism, unspecified: Secondary | ICD-10-CM | POA: Diagnosis not present

## 2023-05-05 DIAGNOSIS — M810 Age-related osteoporosis without current pathological fracture: Secondary | ICD-10-CM | POA: Diagnosis not present

## 2023-05-05 DIAGNOSIS — G250 Essential tremor: Secondary | ICD-10-CM | POA: Diagnosis not present

## 2023-05-05 DIAGNOSIS — F5101 Primary insomnia: Secondary | ICD-10-CM | POA: Diagnosis not present

## 2023-05-05 DIAGNOSIS — C50911 Malignant neoplasm of unspecified site of right female breast: Secondary | ICD-10-CM | POA: Diagnosis not present

## 2023-05-05 DIAGNOSIS — I6930 Unspecified sequelae of cerebral infarction: Secondary | ICD-10-CM | POA: Diagnosis not present

## 2023-05-05 DIAGNOSIS — E785 Hyperlipidemia, unspecified: Secondary | ICD-10-CM | POA: Diagnosis not present

## 2023-05-05 DIAGNOSIS — Z0000 Encounter for general adult medical examination without abnormal findings: Secondary | ICD-10-CM | POA: Diagnosis not present

## 2023-06-28 ENCOUNTER — Ambulatory Visit
Admission: RE | Admit: 2023-06-28 | Discharge: 2023-06-28 | Disposition: A | Discharge status: Home / Self Care | Payer: Medicare PPO | Source: Ambulatory Visit | Attending: Internal Medicine | Admitting: Internal Medicine

## 2023-06-28 DIAGNOSIS — M8588 Other specified disorders of bone density and structure, other site: Secondary | ICD-10-CM | POA: Diagnosis not present

## 2023-06-28 DIAGNOSIS — N958 Other specified menopausal and perimenopausal disorders: Secondary | ICD-10-CM | POA: Diagnosis not present

## 2023-06-28 DIAGNOSIS — E2839 Other primary ovarian failure: Secondary | ICD-10-CM | POA: Diagnosis not present

## 2023-06-28 DIAGNOSIS — M810 Age-related osteoporosis without current pathological fracture: Secondary | ICD-10-CM

## 2023-07-27 DIAGNOSIS — C50911 Malignant neoplasm of unspecified site of right female breast: Secondary | ICD-10-CM | POA: Diagnosis not present

## 2023-07-28 DIAGNOSIS — C50911 Malignant neoplasm of unspecified site of right female breast: Secondary | ICD-10-CM | POA: Diagnosis not present

## 2023-08-05 ENCOUNTER — Telehealth: Payer: Self-pay

## 2023-08-05 NOTE — Telephone Encounter (Signed)
 Spoke with patient and confirmed appointment on 08/06/23

## 2023-08-05 NOTE — Progress Notes (Unsigned)
 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  This showed highly suspicious right breast mass at 12 o'clock position 3 cm from the nipple and 1 o'clock position 3 cm from the nipple.  Recommend ultrasound-guided biopsy.  There is a questionable third focal mass at 11:00 3 cm from the nipple.  Additional multicystic mass extending to the nipple at 11 o'clock position 1 cm from the nipple which may represent a focally dilated duct.  No suspicious right axillary lymphadenopathy.  No mammographic evidence of malignancy on the left.   09/22/2022 Pathology Results   Right breast needle core biopsy showed invasive mammary carcinoma, identified as lobular based on loss of E-cadherin, the 1:00 mass 3 cm from the nipple is grade 2 ER/PR positive HER2 negative Ki-67 of 20%.  The right breast needle core biopsy at 12:00 3 cm from the nipple also identified as invasive lobular, high-grade ER positive PR negative HER2 negative with Ki-67 of 5%   09/28/2022 Cancer Staging   Staging form: Breast, AJCC 8th Edition - Clinical stage from 09/28/2022: Stage IIA (cT2, cN0, cM0, G3, ER+, PR+, HER2-) - Signed by Ronny Bacon, PA-C on 09/28/2022 Stage prefix: Initial diagnosis Method of lymph node assessment: Clinical Histologic grading system: 3 grade system   10/13/2022 Surgery   Right breast mastectomy: ILC, 4.9 cm, grade 2, margins negative, 2 SLN negative, ER+, PR-, HER2 -, Ki67 5%,    10/13/2022 Cancer Staging   Staging form: Breast, AJCC 8th Edition - Pathologic stage from 10/13/2022: Stage IIA (pT2, pN0, cM0, G2, ER+, PR-, HER2-) - Signed by Loa Socks, NP on 02/09/2023 Histologic grading system: 3 grade system   10/2022 -  Anti-estrogen oral therapy   Anastrozole     INTERVAL  HISTORY:  [name] is here for follow up while on anastrozole.  Discussed the use of AI scribe software for clinical note transcription with the patient, who gave verbal consent to proceed.  History of Present Illness    Jodi Medina is a 83 year old female with breast cancer who presents with memory issues potentially related to anastrozole use.  She has a history of breast cancer and is currently on anastrozole. She experiences hot flashes and attributes some of her memory issues to the medication, as her memory was better before starting it. She describes her memory issues as a 'memory fog,' with difficulty remembering names and completing tasks, and sometimes forgetting whether she has taken her medication. She has missed her anastrozole dose approximately three times in the past year. She is concerned about her memory issues, especially given her father's history of Alzheimer's disease.  She reports an incident where she scratched her leg at night, resulting in bleeding of dark blood, which was difficult to stop. She has not experienced any known blood clots in the past.  In terms of physical activity, she walks her dog twice a day for 20 to 40 minutes and performs stretches for her breast. She has not returned to the [facility] for additional exercise. She feels tired and does not sleep well, which she believes contributes to her lack of motivation and activity.  Rest of the pertinent 10 point ROS reviewed and neg.  PAST MEDICAL/SURGICAL HISTORY:  Past Medical History:  Diagnosis Date   Arthritis    Breast cancer (  HCC)    Diverticulitis    High cholesterol    High triglycerides    Lactose intolerance    Skin cancer    Torn meniscus    bilateral   Tremor    Past Surgical History:  Procedure Laterality Date   BREAST BIOPSY Right 09/22/2022   [place] RT BREAST BX W LOC DEV 1ST LESION IMG BX SPEC US GUIDE 09/22/2022 [facility]   BREAST BIOPSY Right 09/22/2022   [place] RT BREAST BX W  LOC DEV EA ADD LESION IMG BX SPEC US GUIDE 09/22/2022 [facility]   BREAST EXCISIONAL BIOPSY Left 1990   CATARACT EXTRACTION Right 05/2022   CATARACT EXTRACTION Left 08/2022   COLONOSCOPY  2019   DEEP BRAIN STIMULATOR PLACEMENT  01/2021   DEEP BRAIN STIMULATOR PLACEMENT  02/2021   DENTAL SURGERY  2017   DILATION AND CURETTAGE OF UTERUS  1990   INJECTION KNEE Right 2020   "gel"   MASTECTOMY W/ SENTINEL NODE BIOPSY Right 10/13/2022   Procedure: RIGHT MASTECTOMY WITH AXILLARY SENTINEL LYMPH NODE BIOPSY;  Surgeon: [name], MD;  Location: [facility] OR;  Service: General;  Laterality: Right;   microscopic knee surgery Left 12/2017   for torn meniscus   MULTIPLE TOOTH EXTRACTIONS  07/2022   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   alendronate (FOSAMAX) 70 MG tablet Take 70 mg by mouth every 7 (seven) days. Take with a full glass of water on an empty stomach.   aspirin EC 81 MG tablet Take 81 mg by mouth daily. Swallow whole.   atorvastatin (LIPITOR) 20 MG tablet    CALCIUM CITRATE-VITAMIN D3 PO Take 200 mg by mouth daily.   carbidopa-levodopa (SINEMET IR) 25-100 MG tablet Take 1 tablet by mouth 3 (three) times daily.   carboxymethylcellulose 1 % ophthalmic solution Place 1 drop into both eyes 2 (two) times daily as needed (Dry eyes). Refresh   cholecalciferol (VITAMIN D3) 25 MCG (1000 UNIT) tablet Take 1,000 Units by mouth daily.   clobetasol (TEMOVATE) 0.05 % external solution Apply 1 Application topically daily as needed (itching scalp).   Coenzyme Q10 (CO Q 10) 100 MG CAPS Take 100 mg by mouth.   diclofenac Sodium (VOLTAREN) 1 %  GEL Apply 4 g topically 4 (four) times daily. (Patient taking differently: Apply 4 g topically daily as needed (Arthritis).)   lactase (LACTAID) 3000 units tablet Take 3,000 Units by mouth daily as needed (lactose).   Probiotic Product (ALIGN) 4 MG CAPS Take 4 mg by mouth daily.   propranolol (INDERAL) 10 MG tablet TAKE 1 TABLET(10 MG) BY MOUTH THREE TIMES DAILY AS NEEDED (Patient taking differently: Take 10 mg by mouth 3 (three) times daily.)   vitamin C (ASCORBIC ACID) 250 MG tablet Take 250 mg by mouth daily.   Wheat Dextrin (BENEFIBER DRINK MIX PO) Take by mouth daily.   [DISCONTINUED] anastrozole (ARIMIDEX) 1 MG tablet Take 1 tablet (1 mg total) by mouth daily.   No facility-administered encounter medications on file as of 08/06/2023.     ONCOLOGIC FAMILY HISTORY:  Family History  Problem Relation Age of Onset   Hyperlipidemia Mother    Tremor Mother    Osteoporosis Mother    Dementia Mother    Heart failure Mother    Alzheimer's disease Father    Prostate cancer Brother        dx >56   Breast cancer Maternal Aunt        dx 36s   Heart disease Maternal Uncle    Heart disease Paternal Uncle    Stroke Maternal Grandmother    Melanoma Daughter        dx 26s; leg; s/p excision   Colon cancer Nephew 58       metastatic     SOCIAL HISTORY:  Social History   Socioeconomic History   Marital status: Married    Spouse name: Not on file   Number of children: 3   Years of education: Not on file   Highest education level: Master's degree (e.g., MA, MS, MEng, MEd, MSW, MBA)  Occupational History   Not on file  Tobacco Use   Smoking status: Never   Smokeless tobacco: Never  Vaping Use   Vaping status: Never Used  Substance and Sexual Activity   Alcohol use: Yes    Comment: socially   Drug use: No   Sexual activity: Not on file  Other Topics Concern   Not on file  Social History Narrative   Married, lives at home with husband   Daughter in [place]- 1 grandson    Daughter in [place]- no children   Son- IT works on Community education officer, travel in an RV, lives in [place] degree   Retired school Garment/textile technologist.      Right handed   Caffeine: 1 cup decaf coffee or herbal tea in the mornings; 2 cups daily in the winter of decaf.   Social Drivers of Corporate investment banker Strain: Low Risk  (03/24/2023)   Received from [facility]   Overall Financial Resource Strain (CARDIA)    Difficulty of Paying Living Expenses: Not hard at all  Food Insecurity: No Food Insecurity (03/24/2023)   Received from [facility]   Hunger Vital Sign    Worried About Running Out of Food in the Last Year: Never true    Ran Out of Food in the Last Year: Never true  Transportation Needs: No Transportation Needs (03/24/2023)   Received from [facility] - Transportation    Lack of Transportation (Medical): No    Lack of Transportation (Non-Medical): No  Physical Activity: Unknown (03/24/2023)   Received from [facility]   Exercise Vital Sign    Days of Exercise per Week: 0 days    Minutes of Exercise per Session: Not on file  Stress: No Stress Concern Present (03/24/2023)   Received from [facility] - Occupational Stress Questionnaire    Feeling of Stress : Not at all  Social Connections: Moderately Integrated (03/24/2023)   Received from [facility]   Social Network    How would you rate your social network (family, work, friends)?: Adequate participation with social networks  Intimate Partner Violence: Not At Risk (03/24/2023)   Received from [facility]   HITS    Over the last 12 months how often did your partner physically hurt you?: Never    Over the last 12 months how often did your partner insult you or talk down to you?: Never  Over the last 12 months how often did your partner threaten you with physical harm?: Never    Over the last 12 months how often did your partner scream or  curse at you?: Never     OBSERVATIONS/OBJECTIVE:  BP 115/61 (BP Location: Left Arm, Patient Position: Sitting)   Pulse 61   Temp 98.2 F (36.8 C) (Temporal)   Resp 17   Wt 154 lb 9.6 oz (70.1 kg)   SpO2 99%   BMI 27.39 kg/m  GENERAL: Patient is a well appearing female in no acute distress HEENT:  Sclerae anicteric.  Oropharynx clear and moist. No ulcerations or evidence of oropharyngeal candidiasis. Neck is supple.  NODES:  No cervical, supraclavicular, or axillary lymphadenopathy palpated.  BREAST EXAM: Right breast status postmastectomy, no sign of local recurrence left breast is benign.  LABORATORY DATA:  None for this visit.  DIAGNOSTIC IMAGING:  None for this visit.      ASSESSMENT AND PLAN:  Ms.. Purifoy is a pleasant 83 y.o. female with Stage IIA right breast invasive ductal carcinoma, ER+/PR+/HER2-, diagnosed in 08/2022, treated with lumpectomy, adjuvant radiation therapy, and anti-estrogen therapy with Anastrozole beginning in 10/2022. Most recent bone density showed osteopenia, T score of -1.4  She is currently on anastrozole.  Memory fog and forgetfulness reported, possibly linked to medication. Discussed alternative medications, including tamoxifen, which may be easier on memory but has thromboembolism risk. Active lifestyle may mitigate this risk. - Discontinue anastrozole. - Prescribe tamoxifen and send prescription to [facility] on [facility] - Order mammogram for end of April. - Call in 8 weeks to assess tolerance to tamoxifen and memory improvement.  Memory changes Memory changes attributed to anastrozole. She is concerned due to family history of Alzheimer's disease. - Monitor memory changes after switching to tamoxifen. - Encourage staying active and hydrated.  Bone health Normal baseline bone density  Time spent: 30 min  *Total Encounter Time as defined by the Centers for Medicare and Medicaid Services includes, in addition to the face-to-face time of a  patient visit (documented in the note above) non-face-to-face time: obtaining and reviewing outside history, ordering and reviewing medications, tests or procedures, care coordination (communications with other health care professionals or caregivers) and documentation in the medical record.

## 2023-08-06 ENCOUNTER — Inpatient Hospital Stay: Payer: Medicare PPO | Attending: Hematology and Oncology | Admitting: Hematology and Oncology

## 2023-08-06 ENCOUNTER — Encounter: Payer: Self-pay | Admitting: Hematology and Oncology

## 2023-08-06 VITALS — BP 115/61 | HR 61 | Temp 98.2°F | Resp 17 | Wt 154.6 lb

## 2023-08-06 DIAGNOSIS — Z79811 Long term (current) use of aromatase inhibitors: Secondary | ICD-10-CM | POA: Insufficient documentation

## 2023-08-06 DIAGNOSIS — Z823 Family history of stroke: Secondary | ICD-10-CM | POA: Diagnosis not present

## 2023-08-06 DIAGNOSIS — Z1732 Human epidermal growth factor receptor 2 negative status: Secondary | ICD-10-CM | POA: Insufficient documentation

## 2023-08-06 DIAGNOSIS — C50811 Malignant neoplasm of overlapping sites of right female breast: Secondary | ICD-10-CM | POA: Diagnosis not present

## 2023-08-06 DIAGNOSIS — Z79899 Other long term (current) drug therapy: Secondary | ICD-10-CM | POA: Insufficient documentation

## 2023-08-06 DIAGNOSIS — Z9011 Acquired absence of right breast and nipple: Secondary | ICD-10-CM | POA: Diagnosis not present

## 2023-08-06 DIAGNOSIS — Z818 Family history of other mental and behavioral disorders: Secondary | ICD-10-CM | POA: Diagnosis not present

## 2023-08-06 DIAGNOSIS — R232 Flushing: Secondary | ICD-10-CM | POA: Diagnosis not present

## 2023-08-06 DIAGNOSIS — Z8042 Family history of malignant neoplasm of prostate: Secondary | ICD-10-CM | POA: Diagnosis not present

## 2023-08-06 DIAGNOSIS — Z8262 Family history of osteoporosis: Secondary | ICD-10-CM | POA: Insufficient documentation

## 2023-08-06 DIAGNOSIS — Z800 Family history of malignant neoplasm of digestive organs: Secondary | ICD-10-CM | POA: Insufficient documentation

## 2023-08-06 DIAGNOSIS — Z170 Estrogen receptor positive status [ER+]: Secondary | ICD-10-CM | POA: Diagnosis not present

## 2023-08-06 DIAGNOSIS — Z1721 Progesterone receptor positive status: Secondary | ICD-10-CM | POA: Diagnosis not present

## 2023-08-06 DIAGNOSIS — Z85828 Personal history of other malignant neoplasm of skin: Secondary | ICD-10-CM | POA: Diagnosis not present

## 2023-08-06 DIAGNOSIS — M8580 Other specified disorders of bone density and structure, unspecified site: Secondary | ICD-10-CM | POA: Diagnosis not present

## 2023-08-06 DIAGNOSIS — Z83438 Family history of other disorder of lipoprotein metabolism and other lipidemia: Secondary | ICD-10-CM | POA: Diagnosis not present

## 2023-08-06 DIAGNOSIS — Z803 Family history of malignant neoplasm of breast: Secondary | ICD-10-CM | POA: Diagnosis not present

## 2023-08-06 DIAGNOSIS — Z8249 Family history of ischemic heart disease and other diseases of the circulatory system: Secondary | ICD-10-CM | POA: Insufficient documentation

## 2023-08-06 MED ORDER — TAMOXIFEN CITRATE 20 MG PO TABS: 20.0000 mg | ORAL_TABLET | Freq: Every day | ORAL | 3 refills | Status: DC

## 2023-08-06 NOTE — Assessment & Plan Note (Addendum)
 This is a very pleasant 83 year old female patient with past medical history significant for diverticulitis and parkinsonism referred to breast [facility] for additional recommendations given new diagnosis of right-sided breast cancer.   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  She is now on adjuvant anastrozole.  PLAN

## 2023-08-31 ENCOUNTER — Telehealth: Payer: Self-pay | Admitting: Hematology and Oncology

## 2023-08-31 NOTE — Telephone Encounter (Signed)
 Spoke with pt about rescheduled appt date and time.

## 2023-09-20 ENCOUNTER — Ambulatory Visit
Admission: RE | Admit: 2023-09-20 | Discharge: 2023-09-20 | Disposition: A | Discharge status: Home / Self Care | Source: Ambulatory Visit | Attending: Hematology and Oncology | Admitting: Hematology and Oncology

## 2023-09-20 DIAGNOSIS — Z1231 Encounter for screening mammogram for malignant neoplasm of breast: Secondary | ICD-10-CM | POA: Diagnosis not present

## 2023-09-20 DIAGNOSIS — C50811 Malignant neoplasm of overlapping sites of right female breast: Secondary | ICD-10-CM

## 2023-10-01 ENCOUNTER — Telehealth: Admitting: Hematology and Oncology

## 2023-10-11 NOTE — Progress Notes (Signed)
 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  This showed highly suspicious right breast mass at 12 o'clock position 3 cm from the nipple and 1 o'clock position 3 cm from the nipple.  Recommend ultrasound-guided biopsy.  There is a questionable third focal mass at 11:00 3 cm from the nipple.  Additional multicystic mass extending to the nipple at 11 o'clock position 1 cm from the nipple which may represent a focally dilated duct.  No suspicious right axillary lymphadenopathy.  No mammographic evidence of malignancy on the left.   09/22/2022 Pathology Results   Right breast needle core biopsy showed invasive mammary carcinoma, identified as lobular based on loss of E-cadherin, the 1:00 mass 3 cm from the nipple is grade 2 ER/PR positive HER2 negative Ki-67 of 20%.  The right breast needle core biopsy at 12:00 3 cm from the nipple also identified as invasive lobular, high-grade ER positive PR negative HER2 negative with Ki-67 of 5%   09/28/2022 Cancer Staging   Staging form: Breast, AJCC 8th Edition - Clinical stage from 09/28/2022: Stage IIA (cT2, cN0, cM0, G3, ER+, PR+, HER2-) - Signed by Bettejane Brownie, PA-C on 09/28/2022 Stage prefix: Initial diagnosis Method of lymph node assessment: Clinical Histologic grading system: 3 grade system   10/13/2022 Surgery   Right breast mastectomy: ILC, 4.9 cm, grade 2, margins negative, 2 SLN negative, ER+, PR-, HER2 -, Ki67 5%,    10/13/2022 Cancer Staging   Staging form: Breast, AJCC 8th Edition - Pathologic stage from 10/13/2022: Stage IIA (pT2, pN0, cM0, G2, ER+, PR-, HER2-) - Signed by Percival Brace, NP on 02/09/2023 Histologic grading system: 3 grade system   10/2022 -  Anti-estrogen oral therapy   Anastrozole      INTERVAL  HISTORY:  [name] is here for follow up while on anastrozole .  This is a follow-up telephone visit.  Patient mentions that she has not tried the tamoxifen  yet since she was very worried about her sedentary lifestyle and the risk of bleeding with tamoxifen .  She was hoping to become active and then try it however she says she has not become much more active than her last visit here.  She is interested in exploring other options.  She is however worried that the memory fog can happen again. She did not report any new review of systems today.  PAST MEDICAL/SURGICAL HISTORY:  Past Medical History:  Diagnosis Date   Arthritis    Breast cancer (HCC)    Diverticulitis    High cholesterol    High triglycerides    Lactose intolerance    Skin cancer    Torn meniscus    bilateral   Tremor    Past Surgical History:  Procedure Laterality Date   BREAST BIOPSY Right 09/22/2022   US  RT BREAST BX W LOC DEV 1ST LESION IMG BX SPEC US  GUIDE 09/22/2022 [facility]MO[facility]   BREAST BIOPSY Right 09/22/2022   US  RT BREAST BX W LOC DEV EA ADD LESION IMG BX SPEC US  GUIDE 09/22/2022 [facility]MO[facility]   BREAST EXCISIONAL BIOPSY Left 1990   CATARACT EXTRACTION Right 05/2022   CATARACT EXTRACTION Left 08/2022   COLONOSCOPY  2019   DEEP BRAIN STIMULATOR PLACEMENT  01/2021   DEEP BRAIN STIMULATOR PLACEMENT  02/2021   DENTAL SURGERY  2017   DILATION  AND CURETTAGE OF UTERUS  1990   INJECTION KNEE Right 2020   "gel"   MASTECTOMY W/ SENTINEL NODE BIOPSY Right 10/13/2022   Procedure: RIGHT MASTECTOMY WITH AXILLARY SENTINEL LYMPH NODE BIOPSY;  Surgeon: Enid Harry, MD;  Location: [facility] OR;  Service: General;  Laterality: Right;   microscopic knee surgery Left 12/2017   for torn meniscus   MULTIPLE TOOTH EXTRACTIONS  07/2022   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 Take with a full glass of water on an empty stomach.   aspirin EC 81 MG tablet Take 81 mg by mouth daily. Swallow whole.   atorvastatin  (LIPITOR) 20 MG tablet    CALCIUM  CITRATE-VITAMIN D3 PO Take 200 mg by mouth daily.   carbidopa -levodopa  (SINEMET  IR) 25-100 MG tablet Take 1 tablet by mouth 3 (three) times daily.   carboxymethylcellulose 1 % ophthalmic solution Place 1 drop into both eyes 2 (two) times daily as needed (Dry eyes). Refresh   cholecalciferol (VITAMIN D3) 25 MCG (1000 UNIT) tablet Take 1,000 Units by mouth daily.   clobetasol (TEMOVATE) 0.05 % external solution Apply 1 Application topically daily as needed (itching scalp).   Coenzyme Q10 (CO Q 10) 100 MG CAPS Take 100 mg by mouth.   diclofenac  Sodium (VOLTAREN ) 1 % GEL Apply 4 g topically 4 (four) times daily. (Patient taking differently: Apply 4 g topically daily as needed (Arthritis).)   lactase (LACTAID) 3000 units tablet Take 3,000 Units by mouth daily as needed (lactose).   Probiotic Product (ALIGN) 4 MG CAPS Take 4 mg by mouth daily.   propranolol  (INDERAL ) 10 MG tablet TAKE 1 TABLET(10 MG) BY MOUTH THREE TIMES DAILY AS NEEDED (Patient taking differently: Take 10 mg by mouth 3 (three) times daily.)   vitamin C (ASCORBIC ACID) 250 MG tablet Take 250 mg by mouth daily.   Wheat Dextrin (BENEFIBER DRINK MIX PO) Take by mouth daily.   [DISCONTINUED] tamoxifen  (NOLVADEX ) 20 MG tablet Take 1 tablet (20 mg total) by mouth daily.   No facility-administered encounter medications on file as of 10/12/2023.     ONCOLOGIC FAMILY HISTORY:  Family History  Problem Relation Age of Onset   Hyperlipidemia Mother    Tremor Mother    Osteoporosis Mother    Dementia Mother    Heart failure Mother    Alzheimer's disease Father     Prostate cancer Brother        dx >102   Breast cancer Maternal Aunt        dx 17s   Heart disease Maternal Uncle    Heart disease Paternal Uncle    Stroke Maternal Grandmother    Melanoma Daughter        dx 38s; leg; s/p excision   Colon cancer Nephew 58       metastatic     SOCIAL HISTORY:  Social History   Socioeconomic History   Marital status: Married    Spouse name: Not on file   Number of children: 3   Years of education: Not on file   Highest education level:  Master's degree (e.g., MA, MS, MEng, MEd, MSW, MBA)  Occupational History   Not on file  Tobacco Use   Smoking status: Never   Smokeless tobacco: Never  Vaping Use   Vaping status: Never Used  Substance and Sexual Activity   Alcohol  use: Yes    Comment: socially   Drug use: No   Sexual activity: Not on file  Other Topics Concern   Not on file  Social History Narrative   Married, lives at home with husband   Daughter in [place] - 1 grandson   Daughter in [place]- no children   Son- IT works on Community education officer, travel in an RV, lives in [place] degree   Retired Therapist, sports.      Right handed   Caffeine: 1 cup decaf coffee or herbal tea in the mornings; 2 cups daily in the winter of decaf.   Social Drivers of Corporate investment banker Strain: Low Risk  (03/24/2023)   Received from [facility]   Overall Financial Resource Strain (CARDIA)    Difficulty of Paying Living Expenses: Not hard at all  Food Insecurity: No Food Insecurity (03/24/2023)   Received from [facility]   Hunger Vital Sign    Worried About Running Out of Food in the Last Year: Never true    Ran Out of Food in the Last Year: Never true  Transportation Needs: No Transportation Needs (03/24/2023)   Received from [facility] - Transportation    Lack of Transportation (Medical): No    Lack of Transportation (Non-Medical): No  Physical Activity: Unknown (03/24/2023)   Received from [facility]   Exercise Vital Sign    Days of Exercise per Week: 0 days    Minutes of Exercise per Session: Not on file  Stress: No Stress Concern Present (03/24/2023)   Received from [facility] - Occupational Stress Questionnaire    Feeling of Stress : Not at all  Social Connections: Moderately Integrated (03/24/2023)   Received from [facility]   Social Network    How would you rate your social network (family, work, friends)?: Adequate participation with social networks  Intimate Partner Violence: Not At Risk (03/24/2023)   Received from [facility]   HITS    Over the last 12 months how often did your partner physically hurt you?: Never    Over the last 12 months how often did your partner insult you or talk down to you?: Never    Over the last 12 months how often did your partner threaten you with physical harm?: Never    Over the last 12 months how often did your partner scream or curse at you?: Never     OBSERVATIONS/OBJECTIVE:   There were no vitals taken for this visit.  Physical exam deferred, telephone visit  LABORATORY DATA:  None for this visit.  DIAGNOSTIC IMAGING:  None for this visit.      ASSESSMENT AND PLAN:  Ms.. Jodi Medina is a pleasant 83 y.o. female with Stage IIA right breast invasive ductal carcinoma, ER+/PR+/HER2-, diagnosed in 08/2022, treated with lumpectomy, adjuvant radiation therapy, and anti-estrogen therapy with Anastrozole  beginning in 10/2022.  She developed some concentration and memory issues with anastrozole  hence during her last visit we have discussed about considering tamoxifen .  She however did not start taking tamoxifen  yet, she apparently was worried about the risk of DVT especially given her sedentary lifestyle.  We  have also discussed today about exemestane or Aromasin, reviewed mechanism of action, adverse effects including but not limited to hot flashes, vaginal dryness, arthralgias, bone  density loss once again may experience primary symptoms with exemestane.  She took the name and she wants to do a bit more research about this medication.  She was clearly asked to give us  a call back if she decides to try the exemestane so we can send her prescription.  She should already have a prescription for tamoxifen  if she decides to try.  I will try to follow-up with her in about a month if I do not hear back from her.  I connected with  Eula L Llanas on 10/12/23 by a telephone application and verified that I am speaking with the correct person using two identifiers.   I discussed the limitations of evaluation and management by telemedicine. The patient expressed understanding and agreed to proceed.  Time spent: 10 min  *Total Encounter Time as defined by the Centers for Medicare and Medicaid Services includes, in addition to the face-to-face time of a patient visit (documented in the note above) non-face-to-face time: obtaining and reviewing outside history, ordering and reviewing medications, tests or procedures, care coordination (communications with other health care professionals or caregivers) and documentation in the medical record.

## 2023-10-12 ENCOUNTER — Encounter: Payer: Self-pay | Admitting: Hematology and Oncology

## 2023-10-12 ENCOUNTER — Inpatient Hospital Stay: Attending: Hematology and Oncology | Admitting: Hematology and Oncology

## 2023-10-12 DIAGNOSIS — C50811 Malignant neoplasm of overlapping sites of right female breast: Secondary | ICD-10-CM

## 2023-10-12 DIAGNOSIS — Z170 Estrogen receptor positive status [ER+]: Secondary | ICD-10-CM

## 2023-10-26 ENCOUNTER — Other Ambulatory Visit: Payer: Self-pay | Admitting: Hematology and Oncology

## 2023-10-26 DIAGNOSIS — Z9689 Presence of other specified functional implants: Secondary | ICD-10-CM | POA: Diagnosis not present

## 2023-10-26 DIAGNOSIS — G250 Essential tremor: Secondary | ICD-10-CM | POA: Diagnosis not present

## 2023-10-26 DIAGNOSIS — G20A1 Parkinson's disease without dyskinesia, without mention of fluctuations: Secondary | ICD-10-CM | POA: Diagnosis not present

## 2023-10-27 ENCOUNTER — Telehealth: Payer: Self-pay | Admitting: *Deleted

## 2023-10-27 MED ORDER — TAMOXIFEN CITRATE 20 MG PO TABS: 20.0000 mg | ORAL_TABLET | Freq: Every day | ORAL | 3 refills | Status: DC

## 2023-10-27 NOTE — Telephone Encounter (Signed)
 Refill received for anastrozole - per last MD dictation pt was considering changing medication due to issues with "brain fog".  This RN called pt to inquire if she has made a decision per above so refill or new medication can be called in.  Pt debated her concerns of side effects and how the brain fog is more concerning and that may not change with use of a similar medication though she is concerned with use of tamoxifen  b/c " it can cause blood clots"  This RN discussed above- including low incidence of blood clots as well as verified pt is taking a low dose aspirin daily.  Plan per discussion is pt choose use of tamoxifen  and will start it at a 1/2 tab daily and if tolerated well increase to 1 tab a day.

## 2023-11-01 ENCOUNTER — Telehealth: Payer: Self-pay | Admitting: *Deleted

## 2023-11-01 ENCOUNTER — Other Ambulatory Visit: Payer: Self-pay | Admitting: *Deleted

## 2023-11-01 ENCOUNTER — Encounter: Payer: Self-pay | Admitting: Hematology and Oncology

## 2023-11-01 DIAGNOSIS — Z170 Estrogen receptor positive status [ER+]: Secondary | ICD-10-CM

## 2023-11-01 MED ORDER — ANASTROZOLE 1 MG PO TABS
1.0000 mg | ORAL_TABLET | Freq: Every day | ORAL | 1 refills | Status: DC
Start: 2023-11-01 — End: 2023-11-24

## 2023-11-01 NOTE — Telephone Encounter (Signed)
 Contacted patient in response to Mychart message and phone call stating she wants to remain on Anastrozole  at this time. She states she is starting Pramipexole prescribed by neurologist for tremors. She does not want to start Tamoxifen  at this time, even on lowered dose as she feels that it will be difficult to distinguish which medication is causing side effects. Jodi Medina states she will need an rx for anastrozole  if Dr. Arno Bibles is ok with her staying on it.   Dr.Iruku informed of patient concerns and choice to remain on Anastrozole  at this time. Dr. Arno Bibles gave verbal order to d/c Tamoxifen  and send rx of Anastrozole  at previous dose. She will review medication changes with patient during next appt on 6/23  Contacted patient with this information. She verbalized understanding.Also sent MyChart message.  RX for Anastrozole  sent to [facility]. Tamoxifen  d/c'd.

## 2023-11-08 ENCOUNTER — Telehealth: Payer: Self-pay | Admitting: *Deleted

## 2023-11-08 NOTE — Telephone Encounter (Signed)
 Pt called to cancel telephone visit with provider due to not starting new rx from her neurologist. Advised pt that when she starts new medication, we can schedule another TV. Pt verified when new rx would start and and was scheduled for 2 week f/u to discuss changing antiestrogen. Pt aware of appt date and time and verbalized understanding.

## 2023-11-10 DIAGNOSIS — G20A1 Parkinson's disease without dyskinesia, without mention of fluctuations: Secondary | ICD-10-CM | POA: Diagnosis not present

## 2023-11-10 DIAGNOSIS — M8588 Other specified disorders of bone density and structure, other site: Secondary | ICD-10-CM | POA: Diagnosis not present

## 2023-11-10 DIAGNOSIS — C50911 Malignant neoplasm of unspecified site of right female breast: Secondary | ICD-10-CM | POA: Diagnosis not present

## 2023-11-10 DIAGNOSIS — R350 Frequency of micturition: Secondary | ICD-10-CM | POA: Diagnosis not present

## 2023-11-10 DIAGNOSIS — N393 Stress incontinence (female) (male): Secondary | ICD-10-CM | POA: Diagnosis not present

## 2023-11-15 ENCOUNTER — Telehealth: Admitting: Hematology and Oncology

## 2023-11-24 ENCOUNTER — Inpatient Hospital Stay: Attending: Hematology and Oncology | Admitting: Hematology and Oncology

## 2023-11-24 DIAGNOSIS — C50811 Malignant neoplasm of overlapping sites of right female breast: Secondary | ICD-10-CM

## 2023-11-24 DIAGNOSIS — Z170 Estrogen receptor positive status [ER+]: Secondary | ICD-10-CM | POA: Diagnosis not present

## 2023-11-24 MED ORDER — EXEMESTANE 25 MG PO TABS: 25.0000 mg | ORAL_TABLET | Freq: Every day | ORAL | 1 refills | Status: DC

## 2023-11-24 NOTE — Progress Notes (Signed)
 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  This showed highly suspicious right breast mass at 12 o'clock position 3 cm from the nipple and 1 o'clock position 3 cm from the nipple.  Recommend ultrasound-guided biopsy.  There is a questionable third focal mass at 11:00 3 cm from the nipple.  Additional multicystic mass extending to the nipple at 11 o'clock position 1 cm from the nipple which may represent a focally dilated duct.  No suspicious right axillary lymphadenopathy.  No mammographic evidence of malignancy on the left.   09/22/2022 Pathology Results   Right breast needle core biopsy showed invasive mammary carcinoma, identified as lobular based on loss of E-cadherin, the 1:00 mass 3 cm from the nipple is grade 2 ER/PR positive HER2 negative Ki-67 of 20%.  The right breast needle core biopsy at 12:00 3 cm from the nipple also identified as invasive lobular, high-grade ER positive PR negative HER2 negative with Ki-67 of 5%   09/28/2022 Cancer Staging   Staging form: Breast, AJCC 8th Edition - Clinical stage from 09/28/2022: Stage IIA (cT2, cN0, cM0, G3, ER+, PR+, HER2-) - Signed by Lanell Donald Stagger, PA-C on 09/28/2022 Stage prefix: Initial diagnosis Method of lymph node assessment: Clinical Histologic grading system: 3 grade system   10/13/2022 Surgery   Right breast mastectomy: ILC, 4.9 cm, grade 2, margins negative, 2 SLN negative, ER+, PR-, HER2 -, Ki67 5%,    10/13/2022 Cancer Staging   Staging form: Breast, AJCC 8th Edition - Pathologic stage from 10/13/2022: Stage IIA (pT2, pN0, cM0, G2, ER+, PR-, HER2-) - Signed by Crawford Morna Pickle, NP on 02/09/2023 Histologic grading system: 3 grade system   10/2022 -  Anti-estrogen oral therapy   Anastrozole      INTERVAL  HISTORY:  Jodi Medina is here for follow up while on anastrozole .  This is a follow-up telephone visit. She did some research about tamoxifen  and aromasin. She is worried about risk of blood clots and stroke, her mom had them. She is worried if she will have dementia from aromasin. She is inclined to try aromasin. She continues to have memory fog, like she forgets where she is in the conversation.   PAST MEDICAL/SURGICAL HISTORY:  Past Medical History:  Diagnosis Date   Arthritis    Breast cancer (HCC)    Diverticulitis    High cholesterol    High triglycerides    Lactose intolerance    Skin cancer    Torn meniscus    bilateral   Tremor    Past Surgical History:  Procedure Laterality Date   BREAST BIOPSY Right 09/22/2022   US  RT BREAST BX W LOC DEV 1ST LESION IMG BX SPEC US  GUIDE 09/22/2022 [facility]   BREAST BIOPSY Right 09/22/2022   US  RT BREAST BX W LOC DEV EA ADD LESION IMG BX SPEC US  GUIDE 09/22/2022 [facility]   BREAST EXCISIONAL BIOPSY Left 1990   CATARACT EXTRACTION Right 05/2022   CATARACT EXTRACTION Left 08/2022   COLONOSCOPY  2019   DEEP BRAIN STIMULATOR PLACEMENT  01/2021   DEEP BRAIN STIMULATOR PLACEMENT  02/2021   DENTAL SURGERY  2017   DILATION AND CURETTAGE OF UTERUS  1990   INJECTION KNEE Right 2020   gel   MASTECTOMY W/ SENTINEL NODE BIOPSY Right 10/13/2022   Procedure: RIGHT MASTECTOMY  WITH AXILLARY SENTINEL LYMPH NODE BIOPSY;  Surgeon: Ebbie Cough, MD;  [facility];  Service: General;  Laterality: Right;   microscopic knee surgery Left 12/2017   for torn meniscus   MULTIPLE TOOTH EXTRACTIONS  07/2022   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   alendronate (FOSAMAX) 70 MG tablet Take 70 mg by mouth every 7 (seven) days. Take with a full glass of water on an empty stomach.   aspirin EC 81 MG tablet Take 81 mg by mouth daily. Swallow whole.   atorvastatin  (LIPITOR) 20 MG tablet    CALCIUM  CITRATE-VITAMIN D3 PO Take 200 mg by mouth daily.   carbidopa -levodopa  (SINEMET  IR) 25-100 MG tablet Take 1 tablet by mouth 3 (three) times daily.   carboxymethylcellulose 1 % ophthalmic solution Place 1 drop into both eyes 2 (two) times daily as needed (Dry eyes). Refresh   cholecalciferol (VITAMIN D3) 25 MCG (1000 UNIT) tablet Take 1,000 Units by mouth daily.   clobetasol (TEMOVATE) 0.05 % external solution Apply 1 Application topically daily as needed (itching scalp).   Coenzyme Q10 (CO Q 10) 100 MG CAPS Take 100 mg by mouth.   diclofenac  Sodium (VOLTAREN ) 1 % GEL Apply 4 g topically 4 (four) times daily. (Patient taking differently: Apply 4 g topically daily as needed (Arthritis).)   exemestane (AROMASIN) 25 MG tablet Take 1 tablet (25 mg total) by mouth daily after breakfast.   lactase (LACTAID) 3000 units tablet Take 3,000 Units by mouth daily as needed (lactose).   Probiotic Product (ALIGN) 4 MG CAPS Take 4 mg by mouth daily.   vitamin C (ASCORBIC ACID) 250 MG tablet Take 250 mg by mouth daily.   Wheat Dextrin (BENEFIBER DRINK MIX PO) Take by mouth daily.   [DISCONTINUED] anastrozole  (ARIMIDEX ) 1 MG tablet Take 1 tablet (1 mg total) by mouth daily.   [DISCONTINUED] propranolol  (INDERAL ) 10 MG tablet TAKE 1 TABLET(10 MG) BY MOUTH THREE TIMES DAILY AS NEEDED (Patient taking differently: Take 10 mg by mouth 3 (three) times daily.)   No facility-administered encounter medications on file as of 11/24/2023.     ONCOLOGIC FAMILY HISTORY:  Family History  Problem Relation Age of Onset   Hyperlipidemia Mother    Tremor Mother    Osteoporosis Mother    Dementia Mother     Heart failure Mother    Alzheimer's disease Father    Prostate cancer Brother        dx >42   Breast cancer Maternal Aunt        dx 29s   Heart disease Maternal Uncle    Heart disease Paternal Uncle    Stroke Maternal Grandmother    Melanoma Daughter        dx 31s; leg; s/p excision   Colon cancer Nephew 58       metastatic     SOCIAL HISTORY:  Social History   Socioeconomic History   Marital status: Married    Spouse name: Not on file   Number of children: 3   Years of education: Not on  file   Highest education level: Master's degree (e.g., MA, MS, MEng, MEd, MSW, MBA)  Occupational History   Not on file  Tobacco Use   Smoking status: Never   Smokeless tobacco: Never  Vaping Use   Vaping status: Never Used  Substance and Sexual Activity   Alcohol  use: Yes    Comment: socially   Drug use: No   Sexual activity: Not on file  Other Topics Concern   Not on file  Social History Narrative   Married, lives at home with husband   Daughter in [place] - 1 grandson   Daughter in [place]- no children   Son- IT works on Community education officer, travel in an RV, lives in [place] degree   Retired Therapist, sports.      Right handed   Caffeine: 1 cup decaf coffee or herbal tea in the mornings; 2 cups daily in the winter of decaf.   Social Drivers of Corporate investment banker Strain: Low Risk  (03/24/2023)   Received from [facility]   Overall Financial Resource Strain (CARDIA)    Difficulty of Paying Living Expenses: Not hard at all  Food Insecurity: No Food Insecurity (03/24/2023)   Received from [facility]   Hunger Vital Sign    Within the past 12 months, you worried that your food would run out before you got the money to buy more.: Never true    Within the past 12 months, the food you bought just didn't last and you didn't have money to get more.: Never true  Transportation Needs: No Transportation Needs (03/24/2023)   Received from [facility] - Transportation    Lack of Transportation (Medical): No    Lack of Transportation (Non-Medical): No  Physical Activity: Unknown (03/24/2023)   Received from [facility]   Exercise Vital Sign    On average, how many days per week do you engage in moderate to strenuous exercise (like a brisk walk)?: 0 days    Minutes of Exercise per Session: Not on file  Stress: No Stress Concern Present (03/24/2023)   Received from [facility] - Occupational Stress Questionnaire    Feeling of Stress : Not at all  Social Connections: Moderately Integrated (03/24/2023)   Received from [facility]   Social Network    How would you rate your social network (family, work, friends)?: Adequate participation with social networks  Intimate Partner Violence: Not At Risk (03/24/2023)   Received from [facility]   HITS    Over the last 12 months how often did your partner physically hurt you?: Never    Over the last 12 months how often did your partner insult you or talk down to you?: Never    Over the last 12 months how often did your partner threaten you with physical harm?: Never    Over the last 12 months how often did your partner scream or curse at you?: Never     OBSERVATIONS/OBJECTIVE:   There were no vitals taken for this visit.  Physical exam deferred, telephone visit  LABORATORY DATA:  None for this visit.  DIAGNOSTIC IMAGING:  None for this visit.    ASSESSMENT AND PLAN:  Ms.. Bou is a pleasant 83 y.o. female with Stage IIA right breast invasive ductal carcinoma, ER+/PR+/HER2-, diagnosed in 08/2022, treated with lumpectomy, adjuvant radiation therapy, and anti-estrogen therapy with Anastrozole  beginning in 10/2022.  She developed some concentration  and memory issues with anastrozole  hence during her last visit we have discussed about considering tamoxifen  or exemestane.  We have discussed both options again, she is inclined  towards aromasin, prescribed to the pharmacy. She will try this and keep us  posted if she is unable to tolerate it. Otherwise, I will see her as scheduled in September. She is ok with this plan.  I connected with  Jodi Medina on 11/24/23 by a telephone application and verified that I am speaking with the correct person using two identifiers.   I discussed the limitations of evaluation and management by telemedicine. The patient expressed understanding and agreed to proceed.  Time spent: 20 min  Location of patient: Home Location of provider: office.  *Total Encounter Time as defined by the Centers for Medicare and Medicaid Services includes, in addition to the face-to-face time of a patient visit (documented in the note above) non-face-to-face time: obtaining and reviewing outside history, ordering and reviewing medications, tests or procedures, care coordination (communications with other health care professionals or caregivers) and documentation in the medical record.

## 2023-11-29 ENCOUNTER — Encounter: Payer: Self-pay | Admitting: Hematology and Oncology

## 2023-11-30 DIAGNOSIS — R350 Frequency of micturition: Secondary | ICD-10-CM | POA: Diagnosis not present

## 2023-12-28 DIAGNOSIS — C50811 Malignant neoplasm of overlapping sites of right female breast: Secondary | ICD-10-CM | POA: Diagnosis not present

## 2023-12-28 DIAGNOSIS — Z170 Estrogen receptor positive status [ER+]: Secondary | ICD-10-CM | POA: Diagnosis not present

## 2023-12-29 ENCOUNTER — Other Ambulatory Visit: Payer: Self-pay | Admitting: Hematology and Oncology

## 2023-12-29 DIAGNOSIS — Z170 Estrogen receptor positive status [ER+]: Secondary | ICD-10-CM

## 2024-01-05 ENCOUNTER — Telehealth: Payer: Self-pay | Admitting: *Deleted

## 2024-01-05 NOTE — Telephone Encounter (Signed)
 Voicemail from Jodi Medina, [phone]) requesting HCFA 1500 and UB-04 forms.  Filing an [facility] cancer claim for a policy I forgot I have.   Connected with Jodi Medina/.  Advised to connect with patient accounting [phone]).  Follow prompts for hospital or provider billing options for provider billing.(CMS-1500) or Hospital billing (UB-04).  [facility] office does t not manage billing or Medical records.   Jodi Medina confirmed a provider physician statement is to be signed by provider.  Reviewed [facility] form protocol during call.  Patient will work on obtaining billing information, deliver form to office, sign HIPAA authorization.  Send everything to [facility] as a packet to avoid delays.and save a copy for her records.  Denies further questions or needs.

## 2024-01-23 ENCOUNTER — Other Ambulatory Visit: Payer: Self-pay | Admitting: Hematology and Oncology

## 2024-01-28 ENCOUNTER — Other Ambulatory Visit: Payer: Self-pay | Admitting: Hematology and Oncology

## 2024-01-28 DIAGNOSIS — C50811 Malignant neoplasm of overlapping sites of right female breast: Secondary | ICD-10-CM

## 2024-02-07 ENCOUNTER — Inpatient Hospital Stay: Attending: Hematology and Oncology | Admitting: Hematology and Oncology

## 2024-02-07 VITALS — BP 116/63 | HR 74 | Temp 98.4°F | Resp 14 | Wt 142.0 lb

## 2024-02-07 DIAGNOSIS — Z823 Family history of stroke: Secondary | ICD-10-CM | POA: Diagnosis not present

## 2024-02-07 DIAGNOSIS — Z9841 Cataract extraction status, right eye: Secondary | ICD-10-CM | POA: Insufficient documentation

## 2024-02-07 DIAGNOSIS — Z1722 Progesterone receptor negative status: Secondary | ICD-10-CM | POA: Diagnosis not present

## 2024-02-07 DIAGNOSIS — Z7983 Long term (current) use of bisphosphonates: Secondary | ICD-10-CM | POA: Diagnosis not present

## 2024-02-07 DIAGNOSIS — Z9011 Acquired absence of right breast and nipple: Secondary | ICD-10-CM | POA: Diagnosis not present

## 2024-02-07 DIAGNOSIS — E739 Lactose intolerance, unspecified: Secondary | ICD-10-CM | POA: Diagnosis not present

## 2024-02-07 DIAGNOSIS — Z7982 Long term (current) use of aspirin: Secondary | ICD-10-CM | POA: Insufficient documentation

## 2024-02-07 DIAGNOSIS — Z800 Family history of malignant neoplasm of digestive organs: Secondary | ICD-10-CM | POA: Insufficient documentation

## 2024-02-07 DIAGNOSIS — Z808 Family history of malignant neoplasm of other organs or systems: Secondary | ICD-10-CM | POA: Insufficient documentation

## 2024-02-07 DIAGNOSIS — Z9842 Cataract extraction status, left eye: Secondary | ICD-10-CM | POA: Insufficient documentation

## 2024-02-07 DIAGNOSIS — Z79811 Long term (current) use of aromatase inhibitors: Secondary | ICD-10-CM | POA: Insufficient documentation

## 2024-02-07 DIAGNOSIS — M25473 Effusion, unspecified ankle: Secondary | ICD-10-CM | POA: Diagnosis not present

## 2024-02-07 DIAGNOSIS — Z83438 Family history of other disorder of lipoprotein metabolism and other lipidemia: Secondary | ICD-10-CM | POA: Insufficient documentation

## 2024-02-07 DIAGNOSIS — Z820 Family history of epilepsy and other diseases of the nervous system: Secondary | ICD-10-CM | POA: Diagnosis not present

## 2024-02-07 DIAGNOSIS — Z9229 Personal history of other drug therapy: Secondary | ICD-10-CM | POA: Diagnosis not present

## 2024-02-07 DIAGNOSIS — N6322 Unspecified lump in the left breast, upper inner quadrant: Secondary | ICD-10-CM | POA: Diagnosis not present

## 2024-02-07 DIAGNOSIS — Z85828 Personal history of other malignant neoplasm of skin: Secondary | ICD-10-CM | POA: Insufficient documentation

## 2024-02-07 DIAGNOSIS — G8929 Other chronic pain: Secondary | ICD-10-CM | POA: Diagnosis not present

## 2024-02-07 DIAGNOSIS — Z803 Family history of malignant neoplasm of breast: Secondary | ICD-10-CM | POA: Diagnosis not present

## 2024-02-07 DIAGNOSIS — Z1732 Human epidermal growth factor receptor 2 negative status: Secondary | ICD-10-CM | POA: Diagnosis not present

## 2024-02-07 DIAGNOSIS — Z79899 Other long term (current) drug therapy: Secondary | ICD-10-CM | POA: Diagnosis not present

## 2024-02-07 DIAGNOSIS — Z170 Estrogen receptor positive status [ER+]: Secondary | ICD-10-CM | POA: Diagnosis not present

## 2024-02-07 DIAGNOSIS — K529 Noninfective gastroenteritis and colitis, unspecified: Secondary | ICD-10-CM | POA: Insufficient documentation

## 2024-02-07 DIAGNOSIS — R197 Diarrhea, unspecified: Secondary | ICD-10-CM

## 2024-02-07 DIAGNOSIS — R600 Localized edema: Secondary | ICD-10-CM | POA: Diagnosis not present

## 2024-02-07 DIAGNOSIS — C50811 Malignant neoplasm of overlapping sites of right female breast: Secondary | ICD-10-CM | POA: Insufficient documentation

## 2024-02-07 DIAGNOSIS — Z8249 Family history of ischemic heart disease and other diseases of the circulatory system: Secondary | ICD-10-CM | POA: Insufficient documentation

## 2024-02-07 DIAGNOSIS — Z8262 Family history of osteoporosis: Secondary | ICD-10-CM | POA: Insufficient documentation

## 2024-02-07 DIAGNOSIS — Z87440 Personal history of urinary (tract) infections: Secondary | ICD-10-CM | POA: Diagnosis not present

## 2024-02-07 DIAGNOSIS — G20A1 Parkinson's disease without dyskinesia, without mention of fluctuations: Secondary | ICD-10-CM | POA: Diagnosis not present

## 2024-02-07 NOTE — Progress Notes (Signed)
 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  This showed highly suspicious right breast mass at 12 o'clock position 3 cm from the nipple and 1 o'clock position 3 cm from the nipple.  Recommend ultrasound-guided biopsy.  There is a questionable third focal mass at 11:00 3 cm from the nipple.  Additional multicystic mass extending to the nipple at 11 o'clock position 1 cm from the nipple which may represent a focally dilated duct.  No suspicious right axillary lymphadenopathy.  No mammographic evidence of malignancy on the left.   09/22/2022 Pathology Results   Right breast needle core biopsy showed invasive mammary carcinoma, identified as lobular based on loss of E-cadherin, the 1:00 mass 3 cm from the nipple is grade 2 ER/PR positive HER2 negative Ki-67 of 20%.  The right breast needle core biopsy at 12:00 3 cm from the nipple also identified as invasive lobular, high-grade ER positive PR negative HER2 negative with Ki-67 of 5%   09/28/2022 Cancer Staging   Staging form: Breast, AJCC 8th Edition - Clinical stage from 09/28/2022: Stage IIA (cT2, cN0, cM0, G3, ER+, PR+, HER2-) - Signed by Lanell Donald Stagger, PA-C on 09/28/2022 Stage prefix: Initial diagnosis Method of lymph node assessment: Clinical Histologic grading system: 3 grade system   10/13/2022 Surgery   Right breast mastectomy: ILC, 4.9 cm, grade 2, margins negative, 2 SLN negative, ER+, PR-, HER2 -, Ki67 5%,    10/13/2022 Cancer Staging   Staging form: Breast, AJCC 8th Edition - Pathologic stage from 10/13/2022: Stage IIA (pT2, pN0, cM0, G2, ER+, PR-, HER2-) - Signed by Crawford Morna Pickle, NP on 02/09/2023 Histologic grading system: 3 grade system   10/2022 -  Anti-estrogen oral therapy   Anastrozole      INTERVAL  HISTORY:  Jodi Medina is here for follow up while on exemestane .  Discussed the use of AI scribe software for clinical note transcription with the patient, who gave verbal consent to proceed.  History of Present Illness Jodi Medina is an 83 year old female who presents with diarrhea.  She has been experiencing loose stools for the past three to four weeks, described as unformed and yellowish, without associated pain, blood, or fever. The diarrhea began in August, following a urinary tract infection in June or July, for which she took antibiotics. The symptoms worsened over time but have recently improved with dietary changes, including a clear liquid diet and bland foods such as cream of wheat, potatoes, and broth.  She started taking exemestane  after July 2nd, and the onset of diarrhea occurred in mid-August. She is uncertain if the medication is related to her symptoms. She has been taking Florastor to help manage her symptoms. She experiences diarrhea multiple times a day, often triggered by eating, and sometimes in between meals. She has been managing her symptoms with dietary adjustments and reports some improvement.  In addition to diarrhea, she mentions balance issues, particularly at night, but does not use a Matheson or cane regularly. She experiences occasional stumbling.  She also reports swelling in one leg, which has been present since undergoing multiple operations and taking various medications. Additionally, she has a persistent pain under her shoulder blade, which she attributes to muscle strain from using a recumbent bike and other physical activities. She manages this pain with a heating pad.  PAST MEDICAL/SURGICAL HISTORY:  Past Medical History:  Diagnosis Date   Arthritis    Breast cancer (HCC)    Diverticulitis    High cholesterol    High triglycerides    Lactose intolerance    Skin cancer    Torn meniscus    bilateral   Tremor    Past Surgical History:   Procedure Laterality Date   BREAST BIOPSY Right 09/22/2022   US  RT BREAST BX W LOC DEV 1ST LESION IMG BX SPEC US  GUIDE 09/22/2022 [facility]   BREAST BIOPSY Right 09/22/2022   US  RT BREAST BX W LOC DEV EA ADD LESION IMG BX SPEC US  GUIDE 09/22/2022 [facility]   BREAST EXCISIONAL BIOPSY Left 1990   CATARACT EXTRACTION Right 05/2022   CATARACT EXTRACTION Left 08/2022   COLONOSCOPY  2019   DEEP BRAIN STIMULATOR PLACEMENT  01/2021   DEEP BRAIN STIMULATOR PLACEMENT  02/2021   DENTAL SURGERY  2017   DILATION AND CURETTAGE OF UTERUS  1990   INJECTION KNEE Right 2020   gel   MASTECTOMY W/ SENTINEL NODE BIOPSY Right 10/13/2022   Procedure: RIGHT MASTECTOMY WITH AXILLARY SENTINEL LYMPH NODE BIOPSY;  Surgeon: Ebbie Cough, MD;  Location: [facility] OR;  Service: General;  Laterality: Right;   microscopic knee surgery Left 12/2017   for torn meniscus   MULTIPLE TOOTH EXTRACTIONS  07/2022   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 Take with a full glass of water on an empty stomach.   aspirin EC 81 MG tablet Take 81 mg by mouth daily. Swallow whole.   atorvastatin  (LIPITOR) 20 MG tablet    CALCIUM  CITRATE-VITAMIN D3 PO Take 200 mg by mouth daily.   carboxymethylcellulose 1 % ophthalmic solution Place 1 drop into both eyes 2 (two) times daily as needed (Dry eyes). Refresh   cholecalciferol (VITAMIN D3) 25 MCG (1000 UNIT) tablet Take 1,000 Units by mouth daily.   clobetasol (TEMOVATE) 0.05 % external solution Apply 1 Application topically daily as needed (itching scalp).   Coenzyme Q10 (CO Q 10) 100 MG CAPS Take 100 mg by mouth.    diclofenac  Sodium (VOLTAREN ) 1 % GEL Apply 4 g topically 4 (four) times daily. (Patient taking differently: Apply 4 g topically daily as needed (Arthritis).)   exemestane  (AROMASIN ) 25 MG tablet TAKE ONE TABLET BY MOUTH ONE TIME DAILY AFTER BREAKFAST   lactase (LACTAID) 3000 units tablet Take 3,000 Units by mouth daily as needed (lactose).   pramipexole (MIRAPEX) 0.25 MG tablet Take 0.25 mg by mouth 3 (three) times daily.   Probiotic Product (ALIGN) 4 MG CAPS Take 4 mg by mouth daily.   saccharomyces boulardii (FLORASTOR) 250 MG capsule Take 250 mg by mouth daily.   vitamin C (ASCORBIC ACID) 250 MG tablet Take 250 mg by mouth daily.   Wheat Dextrin (BENEFIBER DRINK MIX PO) Take by mouth daily.   carbidopa -levodopa  (SINEMET  IR) 25-100 MG tablet Take 1 tablet by mouth 3 (three) times daily.   No facility-administered encounter medications on file as of 02/07/2024.     ONCOLOGIC FAMILY  HISTORY:  Family History  Problem Relation Age of Onset   Hyperlipidemia Mother    Tremor Mother    Osteoporosis Mother    Dementia Mother    Heart failure Mother    Alzheimer's disease Father    Prostate cancer Brother        dx >71   Breast cancer Maternal Aunt        dx 77s   Heart disease Maternal Uncle    Heart disease Paternal Uncle    Stroke Maternal Grandmother    Melanoma Daughter        dx 77s; leg; s/p excision   Colon cancer Nephew 58       metastatic     SOCIAL HISTORY:  Social History   Socioeconomic History   Marital status: Married    Spouse name: Not on file   Number of children: 3   Years of education: Not on file   Highest education level: Master's degree (e.g., MA, MS, MEng, MEd, MSW, MBA)  Occupational History   Not on file  Tobacco Use   Smoking status: Never   Smokeless tobacco: Never  Vaping Use   Vaping status: Never Used  Substance and Sexual Activity   Alcohol  use: Yes    Comment: socially   Drug use: No   Sexual activity: Not on file  Other Topics  Concern   Not on file  Social History Narrative   Married, lives at home with husband   Daughter in [place] - 1 grandson   Daughter in [place]- no children   Son- IT works on Community education officer, travel in an RV, lives in [place] degree   Retired school Garment/textile technologist.      Right handed   Caffeine: 1 cup decaf coffee or herbal tea in the mornings; 2 cups daily in the winter of decaf.   Social Drivers of Corporate investment banker Strain: Low Risk  (03/24/2023)   Received from [facility]   Overall Financial Resource Strain (CARDIA)    Difficulty of Paying Living Expenses: Not hard at all  Food Insecurity: No Food Insecurity (03/24/2023)   Received [facility]   Hunger Vital Sign    Within the past 12 months, you worried that your food would run out before you got the money to buy more.: Never true    Within the past 12 months, the food you bought just didn't last and you didn't have money to get more.: Never true  Transportation Needs: No Transportation Needs (03/24/2023)   Received from [facility] - Transportation    Lack of Transportation (Medical): No    Lack of Transportation (Non-Medical): No  Physical Activity: Unknown (03/24/2023)   Received from [facility]   Exercise Vital Sign    On average, how many days per week do you engage in moderate to strenuous exercise (like a brisk walk)?: 0 days    Minutes of Exercise per Session: Not on file  Stress: No Stress Concern Present (03/24/2023)   Received from [facility] - Occupational Stress Questionnaire    Feeling of Stress : Not at all  Social Connections: Moderately Integrated (03/24/2023)   Received from [facility]   Social Network    How would you rate your social network (family, work, friends)?: Adequate participation with social networks  Intimate Partner Violence: Not At Risk (03/24/2023)   Received from [facility]   HITS  Over  the last 12 months how often did your partner physically hurt you?: Never    Over the last 12 months how often did your partner insult you or talk down to you?: Never    Over the last 12 months how often did your partner threaten you with physical harm?: Never    Over the last 12 months how often did your partner scream or curse at you?: Never     OBSERVATIONS/OBJECTIVE:   BP 116/63 (BP Location: Left Arm, Patient Position: Sitting, Cuff Size: Normal)   Pulse 74   Temp 98.4 F (36.9 C) (Temporal)   Resp 14   Wt 142 lb (64.4 kg)   SpO2 96%   BMI 25.15 kg/m   Physical Exam Constitutional:      Appearance: Normal appearance.  Cardiovascular:     Rate and Rhythm: Normal rate and regular rhythm.     Pulses: Normal pulses.     Heart sounds: Normal heart sounds.  Pulmonary:     Effort: Pulmonary effort is normal.     Breath sounds: Normal breath sounds.  Chest:     Comments: S/p right mastectomy No palpable mass left breast. No regional adenopathy Musculoskeletal:        General: No swelling.     Cervical back: Normal range of motion and neck supple. No rigidity.  Lymphadenopathy:     Cervical: No cervical adenopathy.  Skin:    General: Skin is warm and dry.  Neurological:     General: No focal deficit present.     Mental Status: She is alert.  Psychiatric:        Mood and Affect: Mood normal.      LABORATORY DATA:  None for this visit.  DIAGNOSTIC IMAGING:  None for this visit.    ASSESSMENT AND PLAN:  Jodi Medina is a pleasant 83 y.o. female with Stage IIA right breast invasive ductal carcinoma, ER+/PR+/HER2-, diagnosed in 08/2022, treated with lumpectomy, adjuvant radiation therapy, and anti-estrogen therapy with Anastrozole  beginning in 10/2022. She discontinued anastrozole  in June 2025.  She started taking exemestane  in July 2025.  Assessment and Plan Assessment & Plan Breast cancer on endocrine therapy Breast cancer managed with exemestane  since July 2nd.  Diarrhea may be a side effect, but not confirmed. - Monitor diarrhea symptoms and consider holding exemestane  if no improvement. - Follow up in 1 month via phone to assess symptoms.  Chronic diarrhea Chronic diarrhea for 3-4 weeks, possibly related to recent antibiotic use for UTI or exemestane . Differential includes antibiotic-associated diarrhea and medication side effect. - Continue bland diet and monitor symptoms. - Consider holding exemestane  if diarrhea does not improve in 1-2 weeks. - Consider probiotics to restore gut flora.  Chronic lower extremity edema Chronic edema in the left leg, more pronounced than the right, possibly related to past surgeries and medications.  Left upper back/shoulder blade musculoskeletal pain Chronic musculoskeletal pain under left shoulder blade, likely related to recumbent bike use. - Avoid using heart monitor on recumbent bike. - Use heating pad for pain relief.  Gait instability Intermittent gait instability, particularly at night. - Consider using a cane for stability, especially at night.    *Total Encounter Time as defined by the Centers for Medicare and Medicaid Services includes, in addition to the face-to-face time of a patient visit (documented in the note above) non-face-to-face time: obtaining and reviewing outside history, ordering and reviewing medications, tests or procedures, care coordination (communications with other health care professionals or caregivers) and  documentation in the medical record.

## 2024-02-08 DIAGNOSIS — R197 Diarrhea, unspecified: Secondary | ICD-10-CM | POA: Diagnosis not present

## 2024-02-08 DIAGNOSIS — R300 Dysuria: Secondary | ICD-10-CM | POA: Diagnosis not present

## 2024-02-10 DIAGNOSIS — R197 Diarrhea, unspecified: Secondary | ICD-10-CM | POA: Diagnosis not present

## 2024-02-21 ENCOUNTER — Other Ambulatory Visit: Payer: Self-pay | Admitting: Hematology and Oncology

## 2024-02-25 ENCOUNTER — Ambulatory Visit: Admitting: Obstetrics and Gynecology

## 2024-03-02 ENCOUNTER — Telehealth: Payer: Self-pay

## 2024-03-02 NOTE — Telephone Encounter (Signed)
 I relayed the message from Dr Thedora to pt, she understood.

## 2024-03-02 NOTE — Telephone Encounter (Signed)
 Copied from CRM #[id number]. Topic: Clinical - Request for Lab/Test Order >> Mar 02, 2024 10:37 AM Jodi Medina wrote: Reason for CRM: Patient has a new patient appt on 11/24 with Dr. Thedora and would like to know if we can go ahead and order labs to make sure her UTI is gone and her intestional infection is gone (went to an urgent care for this dx).  Patient callback is [phone] (home)  Hey Dr. Thedora, can this be done prior to appointment with you ?

## 2024-03-02 NOTE — Telephone Encounter (Signed)
 Copied from CRM [id number]. Topic: General - Other >> Mar 02, 2024  2:55 PM Nessti S wrote: Reason for CRM: patient returning a call; no message left.

## 2024-03-15 DIAGNOSIS — Z1330 Encounter for screening examination for mental health and behavioral disorders, unspecified: Secondary | ICD-10-CM | POA: Diagnosis not present

## 2024-03-15 DIAGNOSIS — G250 Essential tremor: Secondary | ICD-10-CM | POA: Diagnosis not present

## 2024-03-15 DIAGNOSIS — Z8673 Personal history of transient ischemic attack (TIA), and cerebral infarction without residual deficits: Secondary | ICD-10-CM | POA: Diagnosis not present

## 2024-03-15 DIAGNOSIS — G20C Parkinsonism, unspecified: Secondary | ICD-10-CM | POA: Diagnosis not present

## 2024-04-07 DIAGNOSIS — Z9689 Presence of other specified functional implants: Secondary | ICD-10-CM | POA: Diagnosis not present

## 2024-04-07 DIAGNOSIS — G250 Essential tremor: Secondary | ICD-10-CM | POA: Diagnosis not present

## 2024-04-07 DIAGNOSIS — G20A1 Parkinson's disease without dyskinesia, without mention of fluctuations: Secondary | ICD-10-CM | POA: Diagnosis not present

## 2024-04-10 DIAGNOSIS — C4491 Basal cell carcinoma of skin, unspecified: Secondary | ICD-10-CM | POA: Insufficient documentation

## 2024-04-10 DIAGNOSIS — M810 Age-related osteoporosis without current pathological fracture: Secondary | ICD-10-CM | POA: Insufficient documentation

## 2024-04-10 DIAGNOSIS — E785 Hyperlipidemia, unspecified: Secondary | ICD-10-CM | POA: Insufficient documentation

## 2024-04-10 DIAGNOSIS — E559 Vitamin D deficiency, unspecified: Secondary | ICD-10-CM | POA: Insufficient documentation

## 2024-04-10 DIAGNOSIS — Z8673 Personal history of transient ischemic attack (TIA), and cerebral infarction without residual deficits: Secondary | ICD-10-CM | POA: Insufficient documentation

## 2024-04-10 DIAGNOSIS — K5790 Diverticulosis of intestine, part unspecified, without perforation or abscess without bleeding: Secondary | ICD-10-CM | POA: Insufficient documentation

## 2024-04-10 DIAGNOSIS — Z8619 Personal history of other infectious and parasitic diseases: Secondary | ICD-10-CM | POA: Insufficient documentation

## 2024-04-11 DIAGNOSIS — D3132 Benign neoplasm of left choroid: Secondary | ICD-10-CM | POA: Diagnosis not present

## 2024-04-11 DIAGNOSIS — H524 Presbyopia: Secondary | ICD-10-CM | POA: Diagnosis not present

## 2024-04-11 DIAGNOSIS — H04123 Dry eye syndrome of bilateral lacrimal glands: Secondary | ICD-10-CM | POA: Diagnosis not present

## 2024-04-12 DIAGNOSIS — G20A1 Parkinson's disease without dyskinesia, without mention of fluctuations: Secondary | ICD-10-CM | POA: Diagnosis not present

## 2024-04-12 DIAGNOSIS — R6889 Other general symptoms and signs: Secondary | ICD-10-CM | POA: Diagnosis not present

## 2024-04-12 DIAGNOSIS — M6281 Muscle weakness (generalized): Secondary | ICD-10-CM | POA: Diagnosis not present

## 2024-04-17 ENCOUNTER — Encounter: Payer: Self-pay | Admitting: Family Medicine

## 2024-04-17 ENCOUNTER — Ambulatory Visit: Admitting: Family Medicine

## 2024-04-17 ENCOUNTER — Ambulatory Visit: Payer: Self-pay | Admitting: Family Medicine

## 2024-04-17 VITALS — BP 130/76 | HR 68 | Temp 98.4°F | Ht 63.0 in | Wt 146.0 lb

## 2024-04-17 DIAGNOSIS — Z170 Estrogen receptor positive status [ER+]: Secondary | ICD-10-CM | POA: Diagnosis not present

## 2024-04-17 DIAGNOSIS — G20A1 Parkinson's disease without dyskinesia, without mention of fluctuations: Secondary | ICD-10-CM

## 2024-04-17 DIAGNOSIS — Z860100 Personal history of colon polyps, unspecified: Secondary | ICD-10-CM | POA: Insufficient documentation

## 2024-04-17 DIAGNOSIS — N3000 Acute cystitis without hematuria: Secondary | ICD-10-CM | POA: Diagnosis not present

## 2024-04-17 DIAGNOSIS — E782 Mixed hyperlipidemia: Secondary | ICD-10-CM | POA: Diagnosis not present

## 2024-04-17 DIAGNOSIS — M810 Age-related osteoporosis without current pathological fracture: Secondary | ICD-10-CM | POA: Diagnosis not present

## 2024-04-17 DIAGNOSIS — G250 Essential tremor: Secondary | ICD-10-CM | POA: Diagnosis not present

## 2024-04-17 DIAGNOSIS — C50811 Malignant neoplasm of overlapping sites of right female breast: Secondary | ICD-10-CM | POA: Diagnosis not present

## 2024-04-17 DIAGNOSIS — Z85828 Personal history of other malignant neoplasm of skin: Secondary | ICD-10-CM | POA: Insufficient documentation

## 2024-04-17 LAB — URINALYSIS, ROUTINE W REFLEX MICROSCOPIC
Bilirubin Urine: NEGATIVE
Hgb urine dipstick: NEGATIVE
Ketones, ur: NEGATIVE
Leukocytes,Ua: NEGATIVE
Nitrite: NEGATIVE
RBC / HPF: NONE SEEN (ref 0–?)
Specific Gravity, Urine: 1.01 (ref 1.000–1.030)
Total Protein, Urine: NEGATIVE
Urine Glucose: NEGATIVE
Urobilinogen, UA: 0.2 (ref 0.0–1.0)
WBC, UA: NONE SEEN (ref 0–?)
pH: 6.5 (ref 5.0–8.0)

## 2024-04-17 NOTE — Assessment & Plan Note (Signed)
 Mammogram UTD. Continue exemestane  25 mg daily for 5 years.

## 2024-04-17 NOTE — Assessment & Plan Note (Signed)
 Followed by neurology. Continue pramipexole 0.25 mg TID.

## 2024-04-17 NOTE — Progress Notes (Signed)
 [facility] [address] [place] [address] Dept: [phone] Dept Fax: [phone]  New Patient Office Visit  Subjective:    Patient ID: Jodi Medina, female    DOB: [birth year], 83 y.o..   MRN: [id number]  Chief Complaint  Patient presents with   Establish Care    NP- establish care.      History of Present Illness:  Patient is in today to establish care.  Jodi Medina has a history of Parkinson's disease and a longer-standing essential tremor. This is followed by neurology. She is managed on carbidopa -levodopa  25-100 mg three times daily and pramipexole 0.25 mg TID.   Jodi Medina has a history of breast cancer. She is followed by oncology. She is managed on exemestane  25 mg daily.   Jodi Medina has a history of dyslipidemia. This is managed with atorvastatin  20 mg daily.   Jodi Medina has a history of a prior osteoporotic fracture and bone density results in a osteopenia range. She is managed on calcium  + Vitamin D supplementation and alendronate 70 mg weekly.  Jodi Medina has had several UTIs this year. She notes she was supposed to have had a follow-up after her last occurrence. She has had some ongoing urinary frequency and occasionally notes lower back pain in the evenings. She is scheduled to see urogynecology in Jan.  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 (HCC) 09/28/2022   S/P deep brain stimulator placement  03/06/2021   Colon adenomas 10/23/2016   Benign essential tremor 07/11/2015   Hyperlipidemia 07/11/2015   Past Surgical History:  Procedure Laterality Date   BREAST BIOPSY Right 09/22/2022   US  RT BREAST BX W LOC DEV 1ST LESION IMG BX SPEC US  GUIDE 09/22/2022 [facility]   BREAST BIOPSY Right 09/22/2022   US  RT BREAST BX W LOC DEV EA ADD LESION IMG BX SPEC US  GUIDE 09/22/2022 [facility]   BREAST EXCISIONAL BIOPSY Left 1990   CATARACT EXTRACTION Right 05/2022   CATARACT EXTRACTION Left 08/2022   COLONOSCOPY  2019   DEEP BRAIN STIMULATOR PLACEMENT  01/2021   DEEP BRAIN STIMULATOR PLACEMENT  02/2021   DENTAL SURGERY  2017   DILATION AND CURETTAGE OF UTERUS  1990   INJECTION KNEE Right 2020   gel   MASTECTOMY W/ SENTINEL NODE BIOPSY Right 10/13/2022   Procedure: RIGHT MASTECTOMY WITH AXILLARY SENTINEL LYMPH NODE BIOPSY;  Surgeon: Ebbie Cough, MD;  Location: [facility] OR;  Service: General;  Laterality: Right;   microscopic knee surgery Left 12/2017   for torn meniscus   MULTIPLE TOOTH EXTRACTIONS  07/2022   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   skin cancer removal  1990s   TONSILLECTOMY     as a child   WISDOM TOOTH EXTRACTION     Family History  Problem Relation Age of Onset   Hyperlipidemia Mother    Tremor Mother    Osteoporosis Mother    Dementia Mother    Heart failure Mother    Alzheimer's  disease Father    Prostate cancer Brother        dx >54   Breast cancer Maternal Aunt        dx 38s   Heart disease Maternal Uncle    Heart disease Paternal Uncle    Stroke Maternal Grandmother    Melanoma Daughter        dx 71s; leg; s/p excision   Colon cancer Nephew 58       metastatic   Outpatient Medications Prior to Visit  Medication Sig Dispense Refill   alendronate (FOSAMAX) 70 MG tablet Take 70 mg by mouth every 7 (seven) days. Take with a full glass of water on an empty stomach.     aspirin EC 81 MG tablet Take 81 mg by mouth daily.  Swallow whole.     atorvastatin  (LIPITOR) 20 MG tablet      CALCIUM  CITRATE-VITAMIN D3 PO Take 200 mg by mouth daily.     carbidopa -levodopa  (SINEMET  IR) 25-100 MG tablet Take 1 tablet by mouth 3 (three) times daily.     carboxymethylcellulose 1 % ophthalmic solution Place 1 drop into both eyes 2 (two) times daily as needed (Dry eyes). Refresh     cholecalciferol (VITAMIN D3) 25 MCG (1000 UNIT) tablet Take 1,000 Units by mouth daily.     clobetasol (TEMOVATE) 0.05 % external solution Apply 1 Application topically daily as needed (itching scalp).     Coenzyme Q10 (CO Q 10) 100 MG CAPS Take 100 mg by mouth.     diclofenac  Sodium (VOLTAREN ) 1 % GEL Apply 4 g topically 4 (four) times daily. (Patient taking differently: Apply 4 g topically daily as needed (Arthritis).) 100 g 0   exemestane  (AROMASIN ) 25 MG tablet TAKE ONE TABLET BY MOUTH ONE TIME DAILY AFTER BREAKFAST 30 tablet 1   lactase (LACTAID) 3000 units tablet Take 3,000 Units by mouth daily as needed (lactose).     pramipexole (MIRAPEX) 0.25 MG tablet Take 0.25 mg by mouth 3 (three) times daily.     Probiotic Product (ALIGN) 4 MG CAPS Take 4 mg by mouth daily.     saccharomyces boulardii (FLORASTOR) 250 MG capsule Take 250 mg by mouth daily.     vitamin C (ASCORBIC ACID) 250 MG tablet Take 250 mg by mouth daily.     Wheat Dextrin (BENEFIBER DRINK MIX PO) Take by mouth daily.     No facility-administered medications prior to visit.   Allergies  Allergen Reactions   Lactose Intolerance (Gi)    Objective:   Today's Vitals   04/17/24 0959  BP: 130/76  Pulse: 68  Temp: 98.4 F (36.9 C)  TempSrc: Temporal  SpO2: 97%  Weight: 146 lb (66.2 kg)  Height: 5' 3 (1.6 m)   Body mass index is 25.86 kg/m.   General: Well developed, well nourished. No acute distress. Neuro: Visible tremor of all extremities. Psych: Alert and oriented. Normal mood and affect.  Health Maintenance Due  Topic Date Due   DTaP/Tdap/Td (1 - Tdap) Never done    Zoster Vaccines- Shingrix (2 of 2) 09/12/2019   COVID-19 Vaccine (7 - 2025-26 season) 01/24/2024   Medicare Annual Wellness (AWV)  05/04/2024     Assessment & Plan:   Problem List Items Addressed This Visit       Nervous and Auditory   Benign essential tremor   Followed by neurology. Continue pramipexole 0.25 mg TID.      Parkinson's disease (HCC)   Review of records indicates this  has been stable. Continue carbidopa -levodopa  25-100 mg three times daily. Continue to follow with neurology.        Musculoskeletal and Integument   Age-related osteoporosis without current pathological fracture   Continue calcium  + Vit. D supplement daily and alendronate 70 mg weekly.        Other   Hyperlipidemia - Primary   I will have Ms. Lindahl return for fasting lipids. Continue atorvastatin  20 mg daily.      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 Continue exemestane  25 mg daily for 5 years.      Other Visit Diagnoses       Acute cystitis without hematuria       I iwll repeat a UA and urine culture to assure the infection has cleared.   Relevant Orders   Urinalysis, Routine w reflex microscopic   Urine Culture       Return in about 3 months (around 07/18/2024) for Reassessment.   Garnette CHRISTELLA Simpler, MD  I,Emily Lagle,acting as a scribe for Garnette CHRISTELLA Simpler, MD.,have documented all relevant documentation on the behalf of Garnette CHRISTELLA Simpler, MD.  I, Garnette CHRISTELLA Simpler, MD, have reviewed all documentation for this visit. The documentation on 04/17/2024 for the exam, diagnosis, procedures, and orders are all accurate and complete.

## 2024-04-17 NOTE — Assessment & Plan Note (Signed)
 I will have Ms. Fort return for fasting lipids. Continue atorvastatin  20 mg daily.

## 2024-04-17 NOTE — Assessment & Plan Note (Addendum)
 Review of records indicates this has been stable. Continue carbidopa -levodopa  25-100 mg three times daily. Continue to follow with neurology.

## 2024-04-17 NOTE — Assessment & Plan Note (Signed)
 Continue calcium  + Vit. D supplement daily and alendronate 70 mg weekly.

## 2024-04-18 DIAGNOSIS — R2681 Unsteadiness on feet: Secondary | ICD-10-CM | POA: Diagnosis not present

## 2024-04-18 DIAGNOSIS — R6889 Other general symptoms and signs: Secondary | ICD-10-CM | POA: Diagnosis not present

## 2024-04-18 DIAGNOSIS — M6281 Muscle weakness (generalized): Secondary | ICD-10-CM | POA: Diagnosis not present

## 2024-04-18 DIAGNOSIS — G20A1 Parkinson's disease without dyskinesia, without mention of fluctuations: Secondary | ICD-10-CM | POA: Diagnosis not present

## 2024-04-18 LAB — URINE CULTURE
MICRO NUMBER:: 17275209
Result:: NO GROWTH
SPECIMEN QUALITY:: ADEQUATE

## 2024-04-19 ENCOUNTER — Other Ambulatory Visit (INDEPENDENT_AMBULATORY_CARE_PROVIDER_SITE_OTHER)

## 2024-04-19 DIAGNOSIS — E782 Mixed hyperlipidemia: Secondary | ICD-10-CM | POA: Diagnosis not present

## 2024-04-19 LAB — COMPREHENSIVE METABOLIC PANEL WITH GFR
ALT: 5 U/L (ref 0–35)
AST: 18 U/L (ref 0–37)
Albumin: 4.5 g/dL (ref 3.5–5.2)
Alkaline Phosphatase: 41 U/L (ref 39–117)
BUN: 12 mg/dL (ref 6–23)
CO2: 26 meq/L (ref 19–32)
Calcium: 9.5 mg/dL (ref 8.4–10.5)
Chloride: 100 meq/L (ref 96–112)
Creatinine, Ser: 0.62 mg/dL (ref 0.40–1.20)
GFR: 83.6 mL/min (ref >60.00)
Glucose, Bld: 100 mg/dL — ABNORMAL HIGH (ref 70–99)
Potassium: 4.1 meq/L (ref 3.5–5.1)
Sodium: 134 meq/L — ABNORMAL LOW (ref 135–145)
Total Bilirubin: 0.9 mg/dL (ref 0.2–1.2)
Total Protein: 7.3 g/dL (ref 6.0–8.3)

## 2024-04-19 LAB — LIPID PANEL
Cholesterol: 147 mg/dL (ref 0–200)
HDL: 66.4 mg/dL (ref >39.00)
LDL Cholesterol: 56 mg/dL (ref 0–99)
NonHDL: 80.53
Total CHOL/HDL Ratio: 2
Triglycerides: 125 mg/dL (ref 0.0–149.0)
VLDL: 25 mg/dL (ref 0.0–40.0)

## 2024-05-14 ENCOUNTER — Other Ambulatory Visit: Payer: Self-pay | Admitting: Hematology and Oncology

## 2024-05-15 ENCOUNTER — Other Ambulatory Visit: Payer: Self-pay | Admitting: Family Medicine

## 2024-05-15 MED ORDER — ALENDRONATE SODIUM 70 MG PO TABS: 70.0000 mg | ORAL_TABLET | ORAL | 1 refills | Status: AC

## 2024-05-15 MED ORDER — ATORVASTATIN CALCIUM 20 MG PO TABS: 20.0000 mg | ORAL_TABLET | Freq: Every day | ORAL | 1 refills | Status: AC

## 2024-05-15 NOTE — Telephone Encounter (Signed)
 Refill request for  Atorvastatin   LR Hx provider  Alendronate   LR  Hx provider LOV   04/17/24 FOV 07/18/24  Please review and advise. Thanks. Dm/cma

## 2024-05-15 NOTE — Telephone Encounter (Signed)
 Copied from CRM #[id number]. Topic: Clinical - Medication Question >> May 15, 2024 10:53 AM Rea ORN wrote: Reason for CRM: Pt stated her old PCP was the last to order atorvastatin  (LIPITOR) 20 MG tablet and alendronate  (FOSAMAX ) 70 MG tablet. Pt is asking if new PCP can order this for her now. She would like them sent to [id number] [place] [facility] [place], [place] - [phone] [place]. Pt is ok with 30 day supply. She will be out by 12/25 on Lipitor and has 2 weeks left on Fosamax .  Please call back to advise [phone]

## 2024-05-30 ENCOUNTER — Encounter: Payer: Self-pay | Admitting: Obstetrics and Gynecology

## 2024-05-30 ENCOUNTER — Other Ambulatory Visit (HOSPITAL_COMMUNITY)
Admission: RE | Admit: 2024-05-30 | Discharge: 2024-05-30 | Disposition: A | Discharge status: Home / Self Care | Source: Other Acute Inpatient Hospital | Attending: Obstetrics and Gynecology | Admitting: Obstetrics and Gynecology

## 2024-05-30 ENCOUNTER — Ambulatory Visit: Admitting: Obstetrics and Gynecology

## 2024-05-30 VITALS — BP 128/76 | HR 76 | Ht 63.0 in | Wt 151.0 lb

## 2024-05-30 DIAGNOSIS — R350 Frequency of micturition: Secondary | ICD-10-CM | POA: Diagnosis present

## 2024-05-30 DIAGNOSIS — N952 Postmenopausal atrophic vaginitis: Secondary | ICD-10-CM | POA: Diagnosis not present

## 2024-05-30 DIAGNOSIS — Z87440 Personal history of urinary (tract) infections: Secondary | ICD-10-CM | POA: Diagnosis not present

## 2024-05-30 DIAGNOSIS — N390 Urinary tract infection, site not specified: Secondary | ICD-10-CM

## 2024-05-30 LAB — POCT URINE DIPSTICK
Bilirubin, UA: NEGATIVE
Blood, UA: NEGATIVE
Glucose, UA: NEGATIVE mg/dL
Ketones, POC UA: NEGATIVE mg/dL
Nitrite, UA: NEGATIVE
POC PROTEIN,UA: NEGATIVE
Spec Grav, UA: 1.01
Urobilinogen, UA: 0.2 U/dL
pH, UA: 6.5

## 2024-05-30 MED ORDER — ESTRADIOL 0.01 % VA CREA: 0.5000 g | TOPICAL_CREAM | VAGINAL | 11 refills | Status: AC

## 2024-05-30 NOTE — Patient Instructions (Addendum)
 Pumpkin seed extract up to 5gm daily can be helpful for overactive bladder spasms.   Cranberry and D-mannose are helpful for UTI prevention.  I will contact Oncology to see about vaginal estrogen cream or an E-string. I have sent them a message and once they let me know about

## 2024-05-30 NOTE — Progress Notes (Signed)
 [facility] New Patient Evaluation and Consultation  Referring Provider: Elliot Charm, MD PCP: Thedora Garnette HERO, MD Date of Service: 05/30/2024  SUBJECTIVE Chief Complaint: New Patient (Initial Visit) (Stress incontinence/)  History of Present Illness: Jodi Medina is a 84 y.o. White or Caucasian female seen in consultation at the request of Dr. Elliot for evaluation of SUI.    Review of records significant for: Hx of parkinson's, has deep brain stimulation   Cultures:  02/08/24 (+E. Coli >100k colonies)  11/15/23: +Klebsiella pneumoniae >100k colonies  Urinary Symptoms: Does not leak urine.    Day time voids 8-10.  Nocturia: 1-2 times per night to void. Voiding dysfunction:  empties bladder well.  Patient does not use a catheter to empty bladder.  When urinating, patient feels a weak stream and dribbling after finishing Drinks: 12 oz coffee AM, 48-64ozWater and sparkling water and occasional sprite per day  UTIs: 2 UTI's in the last year.   Denies history of urologic concerns No results found for the last 90 days.   Pelvic Organ Prolapse Symptoms:                  Patient Denies a feeling of a bulge the vaginal area.    Bowel Symptom: Bowel movements: 1-2 time(s) per day Stool consistency: hard Straining: no.  Splinting: no.  Incomplete evacuation: no.  Patient Denies accidental bowel leakage / fecal incontinence Bowel regimen: diet, fiber, and stool softener Last colonoscopy: Date 2018, Results WNL HM Colonoscopy   This patient has no relevant Health Maintenance data.     Sexual Function Sexually active: no.  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   Torn meniscus    bilateral   Tremor      Past Surgical History:   Past Surgical History:  Procedure Laterality Date   BRAIN SURGERY  DBS   BREAST BIOPSY Right 09/22/2022   US  RT BREAST BX W LOC DEV 1ST LESION IMG BX SPEC US  GUIDE 09/22/2022 [facility]   BREAST BIOPSY Right 09/22/2022   US  RT BREAST BX W LOC DEV EA ADD LESION IMG BX SPEC US  GUIDE 09/22/2022 [facility]   BREAST EXCISIONAL BIOPSY Left 1990   CATARACT EXTRACTION Right 05/2022   CATARACT EXTRACTION Left 08/2022   COLONOSCOPY  2019   DEEP BRAIN STIMULATOR PLACEMENT  01/2021   DEEP BRAIN STIMULATOR PLACEMENT  02/2021   DENTAL SURGERY  2017   DILATION AND CURETTAGE OF UTERUS  1990   EYE SURGERY  cataracts   INJECTION KNEE Right 2020   gel   MASTECTOMY W/ SENTINEL NODE BIOPSY Right 10/13/2022   Procedure: RIGHT MASTECTOMY WITH AXILLARY SENTINEL LYMPH NODE BIOPSY;  Surgeon: Ebbie Cough, MD;  Location: [facility] OR;  Service: General;  Laterality: Right;   microscopic knee surgery Left 12/2017   for torn meniscus   MULTIPLE TOOTH EXTRACTIONS  07/2022   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 Last pap smear was 2014.  Any history of abnormal pap smears: no. HM PAP   This patient has no relevant Health Maintenance data.     Medications: Patient has a current medication list which includes the following prescription(s): alendronate , aspirin ec, atorvastatin , calcium  citrate-vitamin d, carbidopa -levodopa , carboxymethylcellulose, cholecalciferol, co q 10, diclofenac  sodium, docusate sodium, exemestane , lactase, align, vitamin c, wheat dextrin, clobetasol, pramipexole, and saccharomyces boulardii.   Allergies: Patient is allergic to  lactose intolerance (gi).   Social History: Social History[1]  Relationship status: married Patient lives with spouse.   Patient is not employed . Regular exercise: Yes: Walking, recumbent bike, stretching, balance  History of abuse: No  Family History:   Family History  Problem Relation Age of Onset   Heart disease Mother    Hyperlipidemia Mother    Tremor Mother    Osteoporosis Mother    Dementia Mother    Alzheimer's disease Father    Cancer Brother        Prostate   Parkinson's disease Brother    Melanoma Daughter        dx 22s; leg; s/p excision   Cancer Maternal Aunt        Breast   Heart disease Maternal Uncle    Heart disease Paternal Uncle    Stroke Maternal Grandmother    Colon cancer Nephew 58       metastatic     Review of Systems: Review of Systems  Constitutional:  Negative for chills and fever.  Respiratory:  Negative for cough and shortness of breath.   Cardiovascular:  Negative for chest pain and palpitations.  Gastrointestinal:  Negative for abdominal pain, blood in stool, constipation and diarrhea.  Skin:  Negative for rash.  Neurological:  Negative for weakness.  Psychiatric/Behavioral:  Negative for depression and suicidal ideas.      OBJECTIVE Physical Exam: Vitals:   05/30/24 1357  BP: 128/76  Pulse: 76  Weight: 151 lb (68.5 kg)  Height: 5' 3 (1.6 m)    Physical Exam Vitals reviewed. Exam conducted with a chaperone present.  Constitutional:      Appearance: Normal appearance.  Pulmonary:     Effort: Pulmonary effort is normal.  Abdominal:     Palpations: Abdomen is soft.  Neurological:     General: No focal deficit present.     Mental Status: She is alert and oriented to person, place, and time.  Psychiatric:        Mood and Affect: Mood normal.        Behavior: Behavior normal. Behavior is cooperative.        Thought Content: Thought content normal.      GU / Detailed Urogynecologic Evaluation:  Pelvic Exam: Normal  external female genitalia; Bartholin's and Skene's glands normal in appearance; urethral meatus normal in appearance, no urethral masses or discharge.   CST: negative  Speculum exam reveals normal vaginal mucosa with significant atrophy. The vaginal tissues are raw and have a bluish tinge at the urethra. Cervix normal appearance. Uterus normal single, nontender. Adnexa normal adnexa.    With apex supported, anterior compartment defect was present  Pelvic floor strength I/V  Pelvic floor musculature: Right levator non-tender, Right obturator non-tender, Left levator tender, Left obturator non-tender  POP-Q:   POP-Q  -1  Aa   -1                                           Ba  -6.5                                              C   3.5                                            Gh  4                                            Pb  7                                            tvl   -2.5                                            Ap  -2.5                                            Bp  -7                                              D      Rectal Exam:  Normal external exam.  Post-Void Residual (PVR) by Bladder Scan: In order to evaluate bladder emptying, we discussed obtaining a postvoid residual and patient agreed to this procedure.  Procedure: The ultrasound unit was placed on the patient's abdomen in the suprapubic region after the patient had voided.    Post Void Residual - 05/30/24 1402       Post Void Residual   Post Void Residual 13 mL           Laboratory Results: Lab Results  Component Value Date   COLORU yellow 05/30/2024   CLARITYU clear 05/30/2024   GLUCOSEUR negative 05/30/2024   BILIRUBINUR negative 05/30/2024   SPECGRAV 1.010 05/30/2024   RBCUR negative 05/30/2024   PHUR 6.5 05/30/2024   UROBILINOGEN 0.2 05/30/2024   LEUKOCYTESUR Small (1+) (A) 05/30/2024    Lab Results  Component Value Date    CREATININE 0.62 04/19/2024   CREATININE 0.67 09/30/2022   CREATININE 0.81 11/09/2014    No results found for: HGBA1C  Lab Results  Component Value Date   HGB 14.4 09/30/2022     ASSESSMENT AND PLAN Ms. Moler is a 84 y.o. with:  1. Urinary frequency   2. Recurrent UTI   3. Vaginal atrophy    Patient has a  hx of parkinson's and has urinary frequency but does not feel like it is affecting her quality of life. She drinks a lot of fluids and is comfortable in dealing with the frequency at this time.  Patient has has x2 UTI's in the past 6 months. We discussed doing Cranberry extract and D-mannose supplementation. Patient would really benefit from vaginal estrogen support. Patient also to have a home kit for pathnostics in case she has symptoms so we can rule out UTI.  I have sent a message to patient's oncology team to inquire about vaginal estrogen support with estrogen cream or E-string. Patient is open to this if cleared by oncology team. Her tissues are significantly atrophied. Will await response from Oncology.  Oncology has given permission for patient to use estrogen cream for vaginal atrophy and UTI support. This has been sent to the pharmacy and patient has been made aware.    Patient to follow up in 3 months or sooner if needed.   Amritpal Shropshire G Marcelline Temkin, NP        [1]  Social History Tobacco Use   Smoking status: Never   Smokeless tobacco: Never  Vaping Use   Vaping status: Never Used  Substance Use Topics   Alcohol  use: Yes    Comment: seldom, maybe  6 x a year   Drug use: Never

## 2024-05-31 ENCOUNTER — Ambulatory Visit: Payer: Self-pay | Admitting: Obstetrics and Gynecology

## 2024-05-31 LAB — URINE CULTURE: Culture: NO GROWTH

## 2024-06-13 ENCOUNTER — Ambulatory Visit (INDEPENDENT_AMBULATORY_CARE_PROVIDER_SITE_OTHER)

## 2024-06-13 VITALS — BP 106/60 | HR 84 | Temp 98.0°F | Ht 63.0 in | Wt 150.0 lb

## 2024-06-13 DIAGNOSIS — Z0000 Encounter for general adult medical examination without abnormal findings: Secondary | ICD-10-CM

## 2024-06-13 NOTE — Patient Instructions (Signed)
 Ms. Mcconahy,  Thank you for taking the time for your Medicare Wellness Visit. I appreciate your continued commitment to your health goals. Please review the care plan we discussed, and feel free to reach out if I can assist you further.  Please note that Annual Wellness Visits do not include a physical exam. Some assessments may be limited, especially if the visit was conducted virtually. If needed, we may recommend an in-person follow-up with your provider.  Ongoing Care Seeing your primary care provider every 3 to 6 months helps us  monitor your health and provide consistent, personalized care.   Referrals If a referral was made during today's visit and you haven't received any updates within two weeks, please contact the referred provider directly to check on the status.  Recommended Screenings:  Health Maintenance  Topic Date Due   Medicare Annual Wellness Visit  05/04/2024   Zoster (Shingles) Vaccine (2 of 2) 07/18/2024*   Breast Cancer Screening  09/19/2024   COVID-19 Vaccine (8 - Moderna risk 2025-26 season) 11/06/2024   Osteoporosis screening with Bone Density Scan  06/27/2025   DTaP/Tdap/Td vaccine (2 - Td or Tdap) 04/15/2030   Pneumococcal Vaccine for age over 48  Completed   Flu Shot  Completed   Meningitis B Vaccine  Aged Out  *Topic was postponed. The date shown is not the original due date.       06/12/2024    8:56 PM  Advanced Directives  Does Patient Have a Medical Advance Directive? Yes  Type of Estate Agent of [place];Living will  Does patient want to make changes to medical advance directive? No - Patient declined  Copy of Healthcare Power of Attorney in Chart? Yes - validated most recent copy scanned in chart (See row information)    Vision: Annual vision screenings are recommended for early detection of glaucoma, cataracts, and diabetic retinopathy. These exams can also reveal signs of chronic conditions such as diabetes and high blood  pressure.  Dental: Annual dental screenings help detect early signs of oral cancer, gum disease, and other conditions linked to overall health, including heart disease and diabetes.  Please see the attached documents for additional preventive care recommendations.

## 2024-06-13 NOTE — Progress Notes (Signed)
 "  Chief Complaint  Patient presents with   Medicare Wellness     Subjective:   Jodi Medina is a 84 y.o. female who presents for a Medicare Annual Wellness Visit.  Visit info / Clinical Intake: Medicare Wellness Visit Type:: Subsequent Annual Wellness Visit Persons participating in visit and providing information:: patient Medicare Wellness Visit Mode:: In-person (required for WTM) Interpreter Needed?: No Pre-visit prep was completed: yes AWV questionnaire completed by patient prior to visit?: yes Date:: 06/12/24 Living arrangements:: (Patient-Rptd) lives with spouse/significant other Patient's Overall Health Status Rating: (Patient-Rptd) good Typical amount of pain: (Patient-Rptd) none Does pain affect daily life?: (Patient-Rptd) no Are you currently prescribed opioids?: no  Dietary Habits and Nutritional Risks How many meals a day?: (Patient-Rptd) 3 Eats fruit and vegetables daily?: (Patient-Rptd) yes Most meals are obtained by: (Patient-Rptd) preparing own meals; eating out; having others provide food In the last 2 weeks, have you had any of the following?: none  Functional Status Activities of Daily Living (to include ambulation/medication): (Patient-Rptd) Independent Ambulation: (Patient-Rptd) Independent Medication Administration: (Patient-Rptd) Independent Home Management (perform basic housework or laundry): (Patient-Rptd) Independent Manage your own finances?: (Patient-Rptd) yes Primary transportation is: (Patient-Rptd) family / friends Concerns about vision?: no *vision screening is required for WTM* Concerns about hearing?: no  Fall Screening Falls in the past year?: 1 (tripped) Number of falls in past year: (Patient-Rptd) 0 Was there an injury with Fall?: (Patient-Rptd) 0 Fall Risk Category Calculator: (Patient-Rptd) 1 Patient Fall Risk Level: (Patient-Rptd) Low Fall Risk  Fall Risk Patient at Risk for Falls Due to: Medication side effect Fall risk  Follow up: Falls prevention discussed; Education provided; Falls evaluation completed  Home and Transportation Safety: All rugs have non-skid backing?: (Patient-Rptd) yes All stairs or steps have railings?: (Patient-Rptd) yes Grab bars in the bathtub or shower?: (Patient-Rptd) yes Have non-skid surface in bathtub or shower?: (Patient-Rptd) yes Good home lighting?: (Patient-Rptd) yes Regular seat belt use?: (Patient-Rptd) yes Hospital stays in the last year:: (Patient-Rptd) no  Cognitive Assessment Difficulty concentrating, remembering, or making decisions? : (Patient-Rptd) no Will 6CIT or Mini Cog be Completed: yes What year is it?: 0 points What month is it?: 0 points Give patient an address phrase to remember (5 components): [address] [place] About what time is it?: 0 points Count backwards from 20 to 1: 0 points Say the months of the year in reverse: 0 points Repeat the address phrase from earlier: 4 points 6 CIT Score: 4 points  Advance Directives (For Healthcare) Does Patient Have a Medical Advance Directive?: Yes Does patient want to make changes to medical advance directive?: No - Patient declined Type of Advance Directive: Healthcare Power of Kevin; Living will Copy of Healthcare Power of Attorney in Chart?: Yes - validated most recent copy scanned in chart (See row information) Copy of Living Will in Chart?: Yes - validated most recent copy scanned in chart (See row information)  Reviewed/Updated  Reviewed/Updated: Reviewed All (Medical, Surgical, Family, Medications, Allergies, Care Teams, Patient Goals)    Allergies (verified) Lactose intolerance (gi)   Current Medications (verified) Outpatient Encounter Medications as of 06/13/2024  Medication Sig   alendronate  (FOSAMAX ) 70 MG tablet Take 1 tablet (70 mg total) by mouth every 7 (seven) days. Take with a full glass of water on an empty stomach.   aspirin EC 81 MG tablet Take 81 mg by mouth daily. Swallow  whole.   atorvastatin  (LIPITOR) 20 MG tablet Take 1 tablet (20 mg total) by mouth  daily.   CALCIUM  CITRATE-VITAMIN D3 PO Take 200 mg by mouth daily.   carbidopa -levodopa  (SINEMET  IR) 25-100 MG tablet Take 1 tablet by mouth 3 (three) times daily.   carboxymethylcellulose 1 % ophthalmic solution Place 1 drop into both eyes 2 (two) times daily as needed (Dry eyes). Refresh   cholecalciferol (VITAMIN D3) 25 MCG (1000 UNIT) tablet Take 1,000 Units by mouth daily.   clobetasol (TEMOVATE) 0.05 % external solution Apply 1 Application topically daily as needed (itching scalp).   Coenzyme Q10 (CO Q 10) 100 MG CAPS Take 100 mg by mouth.   diclofenac  Sodium (VOLTAREN ) 1 % GEL Apply 4 g topically 4 (four) times daily.   docusate sodium (COLACE) 100 MG capsule Take 100 mg by mouth daily as needed for mild constipation.   estradiol  (ESTRACE ) 0.01 % CREA vaginal cream Place 0.5 g vaginally 2 (two) times a week. Place 0.5g nightly for two weeks then twice a week after   exemestane  (AROMASIN ) 25 MG tablet TAKE ONE TABLET BY MOUTH ONE TIME DAILY AFTER BREAKFAST   lactase (LACTAID) 3000 units tablet Take 3,000 Units by mouth daily as needed (lactose).   pramipexole (MIRAPEX) 0.25 MG tablet Take 0.25 mg by mouth 3 (three) times daily.   Probiotic Product (ALIGN) 4 MG CAPS Take 4 mg by mouth daily.   vitamin C (ASCORBIC ACID) 250 MG tablet Take 250 mg by mouth daily.   Wheat Dextrin (BENEFIBER DRINK MIX PO) Take by mouth daily.   saccharomyces boulardii (FLORASTOR) 250 MG capsule Take 250 mg by mouth daily. (Patient not taking: Reported on 06/13/2024)   No facility-administered encounter medications on file as of 06/13/2024.    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   Stroke [facility]) old infarct  found on MRI   Torn meniscus    bilateral   Tremor    Past Surgical History:  Procedure Laterality Date   BRAIN SURGERY  DBS   BREAST BIOPSY Right 09/22/2022   US  RT BREAST BX W LOC DEV 1ST LESION IMG BX SPEC US  GUIDE 09/22/2022 [facility]   BREAST BIOPSY Right 09/22/2022   US  RT BREAST BX W LOC DEV EA ADD LESION IMG BX SPEC US  GUIDE 09/22/2022 [facility]   BREAST EXCISIONAL BIOPSY Left 1990   CATARACT EXTRACTION Right 05/2022   CATARACT EXTRACTION Left 08/2022   COLONOSCOPY  2019   DEEP BRAIN STIMULATOR PLACEMENT  01/2021   DEEP BRAIN STIMULATOR PLACEMENT  02/2021   DENTAL SURGERY  2017   DILATION AND CURETTAGE OF UTERUS  1990   EYE SURGERY  cataracts   INJECTION KNEE Right 2020   gel   MASTECTOMY W/ SENTINEL NODE BIOPSY Right 10/13/2022   Procedure: RIGHT MASTECTOMY WITH AXILLARY SENTINEL LYMPH NODE BIOPSY;  Surgeon: Ebbie Cough, MD;  Location: [facility] OR;  Service: General;  Laterality: Right;   microscopic knee surgery Left 12/2017   for torn meniscus   MULTIPLE TOOTH EXTRACTIONS  07/2022   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   skin cancer removal  1990s   TONSILLECTOMY     as a child   WISDOM TOOTH EXTRACTION     Family History  Problem Relation Age of Onset  Heart disease Mother    Hyperlipidemia Mother    Tremor Mother    Osteoporosis Mother    Dementia Mother    Varicose Veins Mother    Alzheimer's disease Father    Cancer Brother        Prostate   Parkinson's disease Brother    Asthma Brother    Melanoma Daughter        dx 27s; leg; s/p excision   Cancer Maternal Aunt        Breast   Heart disease Maternal Uncle    Heart disease Paternal Uncle    Stroke Maternal Grandmother    Colon cancer Nephew 58       metastatic   Social History   Occupational History   Not on file  Tobacco Use   Smoking status: Never   Smokeless tobacco: Never  Vaping Use   Vaping status: Never Used  Substance and Sexual Activity    Alcohol  use: Not Currently    Comment: seldom, maybe  4 x a year   Drug use: Never   Sexual activity: Not Currently    Birth control/protection: Abstinence, Pill, Patch, Post-menopausal   Tobacco Counseling Counseling given: Not Answered  SDOH Screenings   Food Insecurity: No Food Insecurity (06/13/2024)  Housing: Unknown (06/13/2024)  Transportation Needs: No Transportation Needs (06/13/2024)  Utilities: Not At Risk (06/13/2024)  Alcohol  Screen: Low Risk (06/13/2024)  Depression (PHQ2-9): Low Risk (06/13/2024)  Financial Resource Strain: Low Risk (06/13/2024)  Physical Activity: Sufficiently Active (06/13/2024)  Recent Concern: Physical Activity - Insufficiently Active (04/10/2024)  Social Connections: Socially Integrated (06/13/2024)  Stress: No Stress Concern Present (06/13/2024)  Tobacco Use: Low Risk (06/13/2024)  Health Literacy: Adequate Health Literacy (06/13/2024)   See flowsheets for full screening details  Depression Screen PHQ 2 & 9 Depression Scale- Over the past 2 weeks, how often have you been bothered by any of the following problems? Little interest or pleasure in doing things: 0 Feeling down, depressed, or hopeless (PHQ Adolescent also includes...irritable): 0 PHQ-2 Total Score: 0 Trouble falling or staying asleep, or sleeping too much: 2 Feeling tired or having little energy: 1 Poor appetite or overeating (PHQ Adolescent also includes...weight loss): 0 Feeling bad about yourself - or that you are a failure or have let yourself or your family down: 0 Trouble concentrating on things, such as reading the newspaper or watching television (PHQ Adolescent also includes...like school work): 0 Moving or speaking so slowly that other people could have noticed. Or the opposite - being so fidgety or restless that you have been moving around a lot more than usual: 0 Thoughts that you would be better off dead, or of hurting yourself in some way: 0 PHQ-9 Total Score: 3 If you checked  off any problems, how difficult have these problems made it for you to do your work, take care of things at home, or get along with other people?: Not difficult at all  Depression Treatment Depression Interventions/Treatment : EYV7-0 Score <4 Follow-up Not Indicated     Goals Addressed               This Visit's Progress     Get exercise routine more constent (pt-stated)               Objective:    Today's Vitals   06/13/24 0817  BP: 106/60  Pulse: 84  Temp: 98 F (36.7 C)  TempSrc: Oral  SpO2: 94%  Weight: 150 lb (68 kg)  Height:  5' 3 (1.6 m)   Body mass index is 26.57 kg/m.  Hearing/Vision screen Vision Screening - Comments:: Regular eye exams Immunizations and Health Maintenance Health Maintenance  Topic Date Due   Zoster Vaccines- Shingrix (2 of 2) 07/18/2024 (Originally 09/12/2019)   Mammogram  09/19/2024   COVID-19 Vaccine (8 - Moderna risk 2025-26 season) 11/06/2024   Medicare Annual Wellness (AWV)  06/13/2025   Bone Density Scan  06/27/2025   DTaP/Tdap/Td (2 - Td or Tdap) 04/15/2030   Pneumococcal Vaccine: 50+ Years  Completed   Influenza Vaccine  Completed   Meningococcal B Vaccine  Aged Out        Assessment/Plan:  This is a routine wellness examination for Jodi Medina.  Patient Care Team: Thedora Garnette HERO, MD as PCP - General (Family Medicine) Loretha Ash, MD as Consulting Physician (Hematology and Oncology) Ebbie Cough, MD as Consulting Physician (General Surgery) Noma Conte, MD as Referring Physician (Neurology) Rosalia Hollering, MD as Referring Physician (Neurology) Waylan Cain, MD as Consulting Physician (Ophthalmology) Aniceto Pfeiffer, PA-C as Physician Assistant (Internal Medicine) Zuleta, Kaitlin G, NP as Nurse Practitioner (Obstetrics and Gynecology) Debby Macintosh, DMD (Dentistry)  I have personally reviewed and noted the following in the patients chart:   Medical and social history Use of alcohol , tobacco or  illicit drugs  Current medications and supplements including opioid prescriptions. Functional ability and status Nutritional status Physical activity Advanced directives List of other physicians Hospitalizations, surgeries, and ER visits in previous 12 months Vitals Screenings to include cognitive, depression, and falls Referrals and appointments  No orders of the defined types were placed in this encounter.  In addition, I have reviewed and discussed with patient certain preventive protocols, quality metrics, and best practice recommendations. A written personalized care plan for preventive services as well as general preventive health recommendations were provided to patient.   Jodi FORBES Dawn, LPN   8/79/7973   Return in 53 weeks (on 06/19/2025) for Medicare Wellness Visit.  After Visit Summary: (In Person-Printed) AVS printed and given to the patient  Nurse Notes: No voiced or noted concerns at this time  "

## 2024-07-18 ENCOUNTER — Ambulatory Visit: Admitting: Family Medicine

## 2024-08-11 ENCOUNTER — Inpatient Hospital Stay: Admitting: Hematology and Oncology

## 2024-08-30 ENCOUNTER — Ambulatory Visit: Admitting: Obstetrics

## 2025-06-19 ENCOUNTER — Ambulatory Visit
